# Patient Record
Sex: Male | Born: 1955
Health system: Southern US, Community
[De-identification: ages and names within clinical notes are randomized; demographics above are authoritative.]

## PROBLEM LIST (undated history)

## (undated) DIAGNOSIS — Z8051 Family history of malignant neoplasm of kidney: Secondary | ICD-10-CM

## (undated) DIAGNOSIS — K219 Gastro-esophageal reflux disease without esophagitis: Secondary | ICD-10-CM

## (undated) DIAGNOSIS — B1920 Unspecified viral hepatitis C without hepatic coma: Secondary | ICD-10-CM

## (undated) DIAGNOSIS — F17200 Nicotine dependence, unspecified, uncomplicated: Secondary | ICD-10-CM

## (undated) DIAGNOSIS — Z806 Family history of leukemia: Secondary | ICD-10-CM

## (undated) DIAGNOSIS — Z800 Family history of malignant neoplasm of digestive organs: Secondary | ICD-10-CM

## (undated) DIAGNOSIS — Z808 Family history of malignant neoplasm of other organs or systems: Secondary | ICD-10-CM

## (undated) DIAGNOSIS — Z801 Family history of malignant neoplasm of trachea, bronchus and lung: Secondary | ICD-10-CM

## (undated) DIAGNOSIS — I700 Atherosclerosis of aorta: Secondary | ICD-10-CM

## (undated) DIAGNOSIS — E785 Hyperlipidemia, unspecified: Secondary | ICD-10-CM

## (undated) DIAGNOSIS — Z973 Presence of spectacles and contact lenses: Secondary | ICD-10-CM

## (undated) DIAGNOSIS — I10 Essential (primary) hypertension: Secondary | ICD-10-CM

## (undated) DIAGNOSIS — Z8041 Family history of malignant neoplasm of ovary: Secondary | ICD-10-CM

## (undated) DIAGNOSIS — J449 Chronic obstructive pulmonary disease, unspecified: Secondary | ICD-10-CM

## (undated) DIAGNOSIS — Z8042 Family history of malignant neoplasm of prostate: Secondary | ICD-10-CM

## (undated) HISTORY — DX: Nicotine dependence, unspecified, uncomplicated: F17.200

## (undated) HISTORY — DX: Family history of malignant neoplasm of other organs or systems: Z80.8

## (undated) HISTORY — DX: Family history of malignant neoplasm of digestive organs: Z80.0

## (undated) HISTORY — DX: Family history of malignant neoplasm of kidney: Z80.51

## (undated) HISTORY — DX: Family history of malignant neoplasm of prostate: Z80.42

## (undated) HISTORY — DX: Family history of malignant neoplasm of ovary: Z80.41

## (undated) HISTORY — DX: Hyperlipidemia, unspecified: E78.5

## (undated) HISTORY — DX: Essential (primary) hypertension: I10

## (undated) HISTORY — DX: Unspecified viral hepatitis C without hepatic coma: B19.20

## (undated) HISTORY — PX: OTHER SURGICAL HISTORY: SHX169

## (undated) HISTORY — DX: Family history of leukemia: Z80.6

## (undated) HISTORY — DX: Presence of spectacles and contact lenses: Z97.3

## (undated) HISTORY — DX: Atherosclerosis of aorta: I70.0

## (undated) HISTORY — DX: Family history of malignant neoplasm of trachea, bronchus and lung: Z80.1

---

## 2005-10-04 ENCOUNTER — Ambulatory Visit: Payer: Self-pay | Admitting: Gastroenterology

## 2006-02-28 ENCOUNTER — Encounter: Admission: RE | Admit: 2006-02-28 | Discharge: 2006-02-28 | Payer: Self-pay | Admitting: Emergency Medicine

## 2006-03-09 ENCOUNTER — Encounter: Admission: RE | Admit: 2006-03-09 | Discharge: 2006-03-09 | Payer: Self-pay | Admitting: Emergency Medicine

## 2006-03-30 ENCOUNTER — Encounter: Admission: RE | Admit: 2006-03-30 | Discharge: 2006-03-30 | Payer: Self-pay | Admitting: Emergency Medicine

## 2006-04-27 ENCOUNTER — Encounter: Admission: RE | Admit: 2006-04-27 | Discharge: 2006-04-27 | Payer: Self-pay | Admitting: Emergency Medicine

## 2010-01-24 ENCOUNTER — Encounter: Payer: Self-pay | Admitting: Emergency Medicine

## 2011-07-16 ENCOUNTER — Encounter (HOSPITAL_COMMUNITY): Payer: Self-pay

## 2011-07-16 ENCOUNTER — Emergency Department (HOSPITAL_COMMUNITY)
Admission: EM | Admit: 2011-07-16 | Discharge: 2011-07-16 | Disposition: A | Discharge status: Home / Self Care | Payer: Worker's Compensation | Attending: Emergency Medicine | Admitting: Emergency Medicine

## 2011-07-16 DIAGNOSIS — S335XXA Sprain of ligaments of lumbar spine, initial encounter: Secondary | ICD-10-CM | POA: Insufficient documentation

## 2011-07-16 DIAGNOSIS — S39012A Strain of muscle, fascia and tendon of lower back, initial encounter: Secondary | ICD-10-CM

## 2011-07-16 DIAGNOSIS — X500XXA Overexertion from strenuous movement or load, initial encounter: Secondary | ICD-10-CM | POA: Insufficient documentation

## 2011-07-16 DIAGNOSIS — J4489 Other specified chronic obstructive pulmonary disease: Secondary | ICD-10-CM | POA: Insufficient documentation

## 2011-07-16 DIAGNOSIS — F17200 Nicotine dependence, unspecified, uncomplicated: Secondary | ICD-10-CM | POA: Insufficient documentation

## 2011-07-16 DIAGNOSIS — J449 Chronic obstructive pulmonary disease, unspecified: Secondary | ICD-10-CM | POA: Insufficient documentation

## 2011-07-16 HISTORY — DX: Chronic obstructive pulmonary disease, unspecified: J44.9

## 2011-07-16 MED ORDER — CYCLOBENZAPRINE HCL 10 MG PO TABS: ORAL_TABLET | ORAL | Status: DC

## 2011-07-16 MED ORDER — HYDROCODONE-ACETAMINOPHEN 5-325 MG PO TABS
1.0000 | ORAL_TABLET | Freq: Once | ORAL | Status: AC
  Administered 2011-07-16: 1 via ORAL
  Filled 2011-07-16: qty 1

## 2011-07-16 MED ORDER — IBUPROFEN 800 MG PO TABS
800.0000 mg | ORAL_TABLET | Freq: Once | ORAL | Status: AC
  Administered 2011-07-16: 800 mg via ORAL
  Filled 2011-07-16: qty 1

## 2011-07-16 MED ORDER — HYDROCODONE-ACETAMINOPHEN 5-325 MG PO TABS: 1.0000 | ORAL_TABLET | Freq: Four times a day (QID) | ORAL | Status: AC | PRN

## 2011-07-16 MED ORDER — CYCLOBENZAPRINE HCL 10 MG PO TABS
10.0000 mg | ORAL_TABLET | Freq: Once | ORAL | Status: AC
  Administered 2011-07-16: 10 mg via ORAL
  Filled 2011-07-16: qty 1

## 2011-07-16 NOTE — ED Provider Notes (Signed)
History     CSN: [id number]  Arrival date & time 07/16/11  5621   First MD Initiated Contact with Patient 07/16/11 [phone]      Chief Complaint  Patient presents with  . Back Pain    (Consider location/radiation/quality/duration/timing/severity/associated sxs/prior treatment) HPI Comments: Injured R lower back lifting a trailer ramp.  Patient is a 56 y.o. male presenting with back pain. The history is provided by the patient. No language interpreter was used.  Back Pain  This is a new problem. Episode onset: 5 days ago. The problem occurs constantly. The problem has not changed since onset.The pain is associated with lifting heavy objects. The pain is present in the lumbar spine. The quality of the pain is described as aching. The pain does not radiate. The pain is severe. The symptoms are aggravated by bending, twisting and certain positions. The pain is the same all the time. Pertinent negatives include no numbness, no bowel incontinence, no perianal numbness, no bladder incontinence, no leg pain, no paresthesias, no paresis, no tingling and no weakness. He has tried ice, NSAIDs and heat for the symptoms. The treatment provided no relief.    Past Medical History  Diagnosis Date  . COPD (chronic obstructive pulmonary disease)     History reviewed. No pertinent past surgical history.  No family history on file.  History  Substance Use Topics  . Smoking status: Current Everyday Smoker  . Smokeless tobacco: Not on file  . Alcohol Use: No      Review of Systems  Gastrointestinal: Negative for bowel incontinence.  Genitourinary: Negative for bladder incontinence.  Musculoskeletal: Positive for back pain.  Neurological: Negative for tingling, weakness, numbness and paresthesias.  All other systems reviewed and are negative.    Allergies  Review of patient's allergies indicates no known allergies.  Home Medications   Current Outpatient Rx  Name Route Sig Dispense Refill    . CYCLOBENZAPRINE HCL 10 MG PO TABS  1/2 to one tab po TID 20 tablet 0  . HYDROCODONE-ACETAMINOPHEN 5-325 MG PO TABS Oral Take 1 tablet by mouth every 6 (six) hours as needed for pain. 20 tablet 0    BP 146/71  Pulse 61  Temp 97.8 F (36.6 C) (Oral)  Resp 20  Ht 6' (1.829 m)  Wt 180 lb (81.647 kg)  BMI 24.41 kg/m2  SpO2 100%  Physical Exam  Nursing note and vitals reviewed. Constitutional: He is oriented to person, place, and time. He appears well-developed and well-nourished.  HENT:  Head: Normocephalic and atraumatic.  Eyes: EOM are normal.  Neck: Normal range of motion.  Cardiovascular: Normal rate, regular rhythm, normal heart sounds and intact distal pulses.   Pulmonary/Chest: Effort normal and breath sounds normal. No respiratory distress.  Abdominal: Soft. He exhibits no distension. There is no tenderness.  Musculoskeletal: He exhibits tenderness.       Lumbar back: He exhibits decreased range of motion and tenderness. He exhibits no bony tenderness.       Back:  Neurological: He is alert and oriented to person, place, and time.  Skin: Skin is warm and dry.  Psychiatric: He has a normal mood and affect. Judgment normal.    ED Course  Procedures (including critical care time)  Labs Reviewed - No data to display No results found.   1. Lumbar strain       MDM  Ice [id number] TID rx-flexeril, 20 rx-hydrocodone, 20        Evalina Field, [place] 07/16/11 [phone]

## 2011-07-16 NOTE — ED Notes (Signed)
Injured low back on Monday --occurred while lifting ramps.

## 2011-07-21 NOTE — ED Provider Notes (Signed)
Medical screening examination/treatment/procedure(s) were performed by non-physician practitioner and as supervising physician I was immediately available for consultation/collaboration.   Nika Yazzie W. Ovidio Steele, MD 07/21/11 2205 

## 2016-04-28 ENCOUNTER — Ambulatory Visit
Admission: RE | Admit: 2016-04-28 | Discharge: 2016-04-28 | Disposition: A | Discharge status: Home / Self Care | Payer: BLUE CROSS/BLUE SHIELD | Source: Ambulatory Visit | Attending: Medical | Admitting: Medical

## 2016-04-28 ENCOUNTER — Ambulatory Visit (INDEPENDENT_AMBULATORY_CARE_PROVIDER_SITE_OTHER): Payer: BLUE CROSS/BLUE SHIELD | Admitting: Medical

## 2016-04-28 ENCOUNTER — Encounter: Payer: Self-pay | Admitting: Medical

## 2016-04-28 VITALS — BP 170/110 | HR 57 | Ht 69.0 in | Wt 184.2 lb

## 2016-04-28 DIAGNOSIS — R229 Localized swelling, mass and lump, unspecified: Secondary | ICD-10-CM | POA: Diagnosis not present

## 2016-04-28 DIAGNOSIS — H9319 Tinnitus, unspecified ear: Secondary | ICD-10-CM

## 2016-04-28 DIAGNOSIS — H6121 Impacted cerumen, right ear: Secondary | ICD-10-CM

## 2016-04-28 DIAGNOSIS — R062 Wheezing: Secondary | ICD-10-CM

## 2016-04-28 DIAGNOSIS — R030 Elevated blood-pressure reading, without diagnosis of hypertension: Secondary | ICD-10-CM

## 2016-04-28 DIAGNOSIS — F17200 Nicotine dependence, unspecified, uncomplicated: Secondary | ICD-10-CM | POA: Diagnosis not present

## 2016-04-28 DIAGNOSIS — M25512 Pain in left shoulder: Secondary | ICD-10-CM | POA: Diagnosis not present

## 2016-04-28 DIAGNOSIS — G8929 Other chronic pain: Secondary | ICD-10-CM

## 2016-04-28 MED ORDER — NAPROXEN 500 MG PO TABS: 500.0000 mg | ORAL_TABLET | Freq: Two times a day (BID) | ORAL | 0 refills | Status: DC

## 2016-04-28 MED ORDER — ALBUTEROL SULFATE HFA 108 (90 BASE) MCG/ACT IN AERS: 2.0000 | INHALATION_SPRAY | Freq: Four times a day (QID) | RESPIRATORY_TRACT | 1 refills | Status: DC | PRN

## 2016-04-28 MED ORDER — FLUTICASONE-SALMETEROL 250-50 MCG/DOSE IN AEPB: 1.0000 | INHALATION_SPRAY | Freq: Two times a day (BID) | RESPIRATORY_TRACT | 2 refills | Status: DC

## 2016-04-28 NOTE — Patient Instructions (Signed)
Left shoulder pain   Go for xray of shoulder  Begin Naprosyn prescription twice daily for 5-7 days for pain and inflammation  We will call with xray results  Wheezing, COPD  STOP SMOKING!!!  Begin trial of Advair inhaler 1 puff twice daily  rinse your mouth out with water after each use  You can ALSO use Albuterol "rescue" inhaler every 6 hours as needed for wheezing, cough, or shortness of breath  Your blood pressure is elevated today  Please start checking your blood pressure and write the numbers down  You can get a cuff at the drug store or just use the blood pressure cuff at the pharmacy to check the pressures  Normal is 120/70. Your pressure was quite high today  Lets plan on seeing you soon for a physical and recheck on blood pressure    Hypertension Hypertension, commonly called high blood pressure, is when the force of blood pumping through the arteries is too strong. The arteries are the blood vessels that carry blood from the heart throughout the body. Hypertension forces the heart to work harder to pump blood and may cause arteries to become narrow or stiff. Having untreated or uncontrolled hypertension can cause heart attacks, strokes, kidney disease, and other problems. A blood pressure reading consists of a higher number over a lower number. Ideally, your blood pressure should be below 120/80. The first ("top") number is called the systolic pressure. It is a measure of the pressure in your arteries as your heart beats. The second ("bottom") number is called the diastolic pressure. It is a measure of the pressure in your arteries as the heart relaxes. What are the causes? The cause of this condition is not known. What increases the risk? Some risk factors for high blood pressure are under your control. Others are not. Factors you can change   Smoking.  Having type 2 diabetes mellitus, high cholesterol, or both.  Not getting enough exercise or physical  activity.  Being overweight.  Having too much fat, sugar, calories, or salt (sodium) in your diet.  Drinking too much alcohol. Factors that are difficult or impossible to change   Having chronic kidney disease.  Having a family history of high blood pressure.  Age. Risk increases with age.  Race. You may be at higher risk if you are African-American.  Gender. Men are at higher risk than women before age 34. After age 16, women are at higher risk than men.  Having obstructive sleep apnea.  Stress. What are the signs or symptoms? Extremely high blood pressure (hypertensive crisis) may cause:  Headache.  Anxiety.  Shortness of breath.  Nosebleed.  Nausea and vomiting.  Severe chest pain.  Jerky movements you cannot control (seizures). How is this diagnosed? This condition is diagnosed by measuring your blood pressure while you are seated, with your arm resting on a surface. The cuff of the blood pressure monitor will be placed directly against the skin of your upper arm at the level of your heart. It should be measured at least twice using the same arm. Certain conditions can cause a difference in blood pressure between your right and left arms. Certain factors can cause blood pressure readings to be lower or higher than normal (elevated) for a short period of time:  When your blood pressure is higher when you are in a health care provider's office than when you are at home, this is called white coat hypertension. Most people with this condition do not need  medicines.  When your blood pressure is higher at home than when you are in a health care provider's office, this is called masked hypertension. Most people with this condition may need medicines to control blood pressure. If you have a high blood pressure reading during one visit or you have normal blood pressure with other risk factors:  You may be asked to return on a different day to have your blood pressure checked  again.  You may be asked to monitor your blood pressure at home for 1 week or longer. If you are diagnosed with hypertension, you may have other blood or imaging tests to help your health care provider understand your overall risk for other conditions. How is this treated? This condition is treated by making healthy lifestyle changes, such as eating healthy foods, exercising more, and reducing your alcohol intake. Your health care provider may prescribe medicine if lifestyle changes are not enough to get your blood pressure under control, and if:  Your systolic blood pressure is above 130.  Your diastolic blood pressure is above 80. Your personal target blood pressure may vary depending on your medical conditions, your age, and other factors. Follow these instructions at home: Eating and drinking   Eat a diet that is high in fiber and potassium, and low in sodium, added sugar, and fat. An example eating plan is called the DASH (Dietary Approaches to Stop Hypertension) diet. To eat this way:  Eat plenty of fresh fruits and vegetables. Try to fill half of your plate at each meal with fruits and vegetables.  Eat whole grains, such as whole wheat pasta, brown rice, or whole grain bread. Fill about one quarter of your plate with whole grains.  Eat or drink low-fat dairy products, such as skim milk or low-fat yogurt.  Avoid fatty cuts of meat, processed or cured meats, and poultry with skin. Fill about one quarter of your plate with lean proteins, such as fish, chicken without skin, beans, eggs, and tofu.  Avoid premade and processed foods. These tend to be higher in sodium, added sugar, and fat.  Reduce your daily sodium intake. Most people with hypertension should eat less than 1,500 mg of sodium a day.  Limit alcohol intake to no more than 1 drink a day for nonpregnant women and 2 drinks a day for men. One drink equals 12 oz of beer, 5 oz of wine, or 1 oz of hard liquor. Lifestyle   Work  with your health care provider to maintain a healthy body weight or to lose weight. Ask what an ideal weight is for you.  Get at least 30 minutes of exercise that causes your heart to beat faster (aerobic exercise) most days of the week. Activities may include walking, swimming, or biking.  Include exercise to strengthen your muscles (resistance exercise), such as pilates or lifting weights, as part of your weekly exercise routine. Try to do these types of exercises for 30 minutes at least 3 days a week.  Do not use any products that contain nicotine or tobacco, such as cigarettes and e-cigarettes. If you need help quitting, ask your health care provider.  Monitor your blood pressure at home as told by your health care provider.  Keep all follow-up visits as told by your health care provider. This is important. Medicines   Take over-the-counter and prescription medicines only as told by your health care provider. Follow directions carefully. Blood pressure medicines must be taken as prescribed.  Do not skip doses  of blood pressure medicine. Doing this puts you at risk for problems and can make the medicine less effective.  Ask your health care provider about side effects or reactions to medicines that you should watch for. Contact a health care provider if:  You think you are having a reaction to a medicine you are taking.  You have headaches that keep coming back (recurring).  You feel dizzy.  You have swelling in your ankles.  You have trouble with your vision. Get help right away if:  You develop a severe headache or confusion.  You have unusual weakness or numbness.  You feel faint.  You have severe pain in your chest or abdomen.  You vomit repeatedly.  You have trouble breathing. Summary  Hypertension is when the force of blood pumping through your arteries is too strong. If this condition is not controlled, it may put you at risk for serious complications.  Your  personal target blood pressure may vary depending on your medical conditions, your age, and other factors. For most people, a normal blood pressure is less than 120/80.  Hypertension is treated with lifestyle changes, medicines, or a combination of both. Lifestyle changes include weight loss, eating a healthy, low-sodium diet, exercising more, and limiting alcohol. This information is not intended to replace advice given to you by your health care provider. Make sure you discuss any questions you have with your health care provider. Document Released: 12/20/2004 Document Revised: 11/18/2015 Document Reviewed: 11/18/2015 Elsevier Interactive Patient Education  2017 [place] DASH stands for "Dietary Approaches to Stop Hypertension." The DASH eating plan is a healthy eating plan that has been shown to reduce high blood pressure (hypertension). Additional health benefits may include reducing the risk of type 2 diabetes mellitus, heart disease, and stroke. The DASH eating plan may also help with weight loss. WHAT DO I NEED TO KNOW ABOUT THE DASH EATING PLAN? For the DASH eating plan, you will follow these general guidelines:  Choose foods with a percent daily value for sodium of less than 5% (as listed on the food label).  Use salt-free seasonings or herbs instead of table salt or sea salt.  Check with your health care provider or pharmacist before using salt substitutes.  Eat lower-sodium products, often labeled as "lower sodium" or "no salt added."  Eat fresh foods.  Eat more vegetables, fruits, and low-fat dairy products.  Choose whole grains. Look for the word "whole" as the first word in the ingredient list.  Choose fish and skinless chicken or [place] more often than red meat. Limit fish, poultry, and meat to 6 oz (170 g) each day.  Limit sweets, desserts, sugars, and sugary drinks.  Choose heart-healthy fats.  Limit cheese to 1 oz (28 g) per day.  Eat  more home-cooked food and less restaurant, buffet, and fast food.  Limit fried foods.  Cook foods using methods other than frying.  Limit canned vegetables. If you do use them, rinse them well to decrease the sodium.  When eating at a restaurant, ask that your food be prepared with less salt, or no salt if possible. WHAT FOODS CAN I EAT? Seek help from a dietitian for individual calorie needs. Grains Whole grain or whole wheat bread. Brown rice. Whole grain or whole wheat pasta. Quinoa, bulgur, and whole grain cereals. Low-sodium cereals. Corn or whole wheat flour tortillas. Whole grain cornbread. Whole grain crackers. Low-sodium crackers. Vegetables Fresh or frozen vegetables (raw, steamed, roasted,  or grilled). Low-sodium or reduced-sodium tomato and vegetable juices. Low-sodium or reduced-sodium tomato sauce and paste. Low-sodium or reduced-sodium canned vegetables.  Fruits All fresh, canned (in natural juice), or frozen fruits. Meat and Other Protein Products Ground beef (85% or leaner), grass-fed beef, or beef trimmed of fat. Skinless chicken or [place]. Ground chicken or [place]. Pork trimmed of fat. All fish and seafood. Eggs. Dried beans, peas, or lentils. Unsalted nuts and seeds. Unsalted canned beans. Dairy Low-fat dairy products, such as skim or 1% milk, 2% or reduced-fat cheeses, low-fat ricotta or cottage cheese, or plain low-fat yogurt. Low-sodium or reduced-sodium cheeses. Fats and Oils Tub margarines without trans fats. Light or reduced-fat mayonnaise and salad dressings (reduced sodium). Avocado. Safflower, olive, or canola oils. Natural peanut or almond butter. Other Unsalted popcorn and pretzels. The items listed above may not be a complete list of recommended foods or beverages. Contact your dietitian for more options. WHAT FOODS ARE NOT RECOMMENDED? Grains White bread. White pasta. White rice. Refined cornbread. Bagels and croissants. Crackers that contain trans  fat. Vegetables Creamed or fried vegetables. Vegetables in a cheese sauce. Regular canned vegetables. Regular canned tomato sauce and paste. Regular tomato and vegetable juices. Fruits Dried fruits. Canned fruit in light or heavy syrup. Fruit juice. Meat and Other Protein Products Fatty cuts of meat. Ribs, chicken wings, bacon, sausage, bologna, salami, chitterlings, fatback, hot dogs, bratwurst, and packaged luncheon meats. Salted nuts and seeds. Canned beans with salt. Dairy Whole or 2% milk, cream, half-and-half, and cream cheese. Whole-fat or sweetened yogurt. Full-fat cheeses or blue cheese. Nondairy creamers and whipped toppings. Processed cheese, cheese spreads, or cheese curds. Condiments Onion and garlic salt, seasoned salt, table salt, and sea salt. Canned and packaged gravies. Worcestershire sauce. Tartar sauce. Barbecue sauce. Teriyaki sauce. Soy sauce, including reduced sodium. Steak sauce. Fish sauce. Oyster sauce. Cocktail sauce. Horseradish. Ketchup and mustard. Meat flavorings and tenderizers. Bouillon cubes. Hot sauce. Tabasco sauce. Marinades. Taco seasonings. Relishes. Fats and Oils Butter, stick margarine, lard, shortening, ghee, and bacon fat. Coconut, palm kernel, or palm oils. Regular salad dressings. Other Pickles and olives. Salted popcorn and pretzels. The items listed above may not be a complete list of foods and beverages to avoid. Contact your dietitian for more information. WHERE CAN I FIND MORE INFORMATION? National Heart, Lung, and Blood Institute: travelstabloid.com Document Released: 12/09/2010 Document Revised: 05/06/2013 Document Reviewed: 10/24/2012 [facility] Patient Information 2015 [place], [place]. This information is not intended to replace advice given to you by your health care provider. Make sure you discuss any questions you have with your health care provider.        Why follow it? Research shows. . Those who  follow the Mediterranean diet have a reduced risk of heart disease  . The diet is associated with a reduced incidence of Parkinson's and Alzheimer's diseases . People following the diet may have longer life expectancies and lower rates of chronic diseases  . The Dietary Guidelines for Americans recommends the Mediterranean diet as an eating plan to promote health and prevent disease  What Is the Mediterranean Diet?  . Healthy eating plan based on typical foods and recipes of Mediterranean-style cooking . The diet is primarily a plant based diet; these foods should make up a majority of meals   Starches - Plant based foods should make up a majority of meals - They are an important sources of vitamins, minerals, energy, antioxidants, and fiber - Choose whole grains, foods high in fiber and minimally processed  items  - Typical grain sources include wheat, oats, barley, corn, brown rice, bulgar, farro, millet, polenta, couscous  - Various types of beans include chickpeas, lentils, fava beans, black beans, white beans   Fruits  Veggies - Large quantities of antioxidant rich fruits & veggies; 6 or more servings  - Vegetables can be eaten raw or lightly drizzled with oil and cooked  - Vegetables common to the traditional Mediterranean Diet include: artichokes, arugula, beets, broccoli, brussel sprouts, cabbage, carrots, celery, collard greens, cucumbers, eggplant, kale, leeks, lemons, lettuce, mushrooms, okra, onions, peas, peppers, potatoes, pumpkin, radishes, rutabaga, shallots, spinach, sweet potatoes, turnips, zucchini - Fruits common to the Mediterranean Diet include: apples, apricots, avocados, cherries, clementines, dates, figs, grapefruits, grapes, melons, nectarines, oranges, peaches, pears, pomegranates, strawberries, tangerines  Fats - Replace butter and margarine with healthy oils, such as olive oil, canola oil, and tahini  - Limit nuts to no more than a handful a day  - Nuts include  walnuts, almonds, pecans, pistachios, pine nuts  - Limit or avoid candied, honey roasted or heavily salted nuts - Olives are central to the [employer] - can be eaten whole or used in a variety of dishes   Meats Protein - Limiting red meat: no more than a few times a month - When eating red meat: choose lean cuts and keep the portion to the size of deck of cards - Eggs: approx. 0 to 4 times a week  - Fish and lean poultry: at least 2 a week  - Healthy protein sources include, chicken, [place], lean beef, lamb - Increase intake of seafood such as tuna, salmon, trout, mackerel, shrimp, scallops - Avoid or limit high fat processed meats such as sausage and bacon  Dairy - Include moderate amounts of low fat dairy products  - Focus on healthy dairy such as fat free yogurt, skim milk, low or reduced fat cheese - Limit dairy products higher in fat such as whole or 2% milk, cheese, ice cream  Alcohol - Moderate amounts of red wine is ok  - No more than 5 oz daily for women (all ages) and men older than age 87  - No more than 10 oz of wine daily for men younger than 72  Other - Limit sweets and other desserts  - Use herbs and spices instead of salt to flavor foods  - Herbs and spices common to the traditional Mediterranean Diet include: basil, bay leaves, chives, cloves, cumin, fennel, garlic, lavender, marjoram, mint, oregano, parsley, pepper, rosemary, sage, savory, sumac, tarragon, thyme   It's not just a diet, it's a lifestyle:  . The Mediterranean diet includes lifestyle factors typical of those in the region  . Foods, drinks and meals are best eaten with others and savored . Daily physical activity is important for overall good health . This could be strenuous exercise like running and aerobics . This could also be more leisurely activities such as walking, housework, yard-work, or taking the stairs . Moderation is the key; a balanced and healthy diet accommodates most foods and  drinks . Consider portion sizes and frequency of consumption of certain foods   Meal Ideas & Options:  . Breakfast:  o Whole wheat toast or whole wheat English muffins with peanut butter & hard boiled egg o Steel cut oats topped with apples & cinnamon and skim milk  o Fresh fruit: banana, strawberries, melon, berries, peaches  o Smoothies: strawberries, bananas, greek yogurt, peanut butter o Low fat greek yogurt with  blueberries and granola  o Egg white omelet with spinach and mushrooms o Breakfast couscous: whole wheat couscous, apricots, skim milk, cranberries  . Sandwiches:  o Hummus and grilled vegetables (peppers, zucchini, squash) on whole wheat bread   o Grilled chicken on whole wheat pita with lettuce, tomatoes, cucumbers or tzatziki  o Tuna salad on whole wheat bread: tuna salad made with greek yogurt, olives, red peppers, capers, green onions o Garlic rosemary lamb pita: lamb sauted with garlic, rosemary, salt & pepper; add lettuce, cucumber, greek yogurt to pita - flavor with lemon juice and black pepper  . Seafood:  o Mediterranean grilled salmon, seasoned with garlic, basil, parsley, lemon juice and black pepper o Shrimp, lemon, and spinach whole-grain pasta salad made with low fat greek yogurt  o Seared scallops with lemon orzo  o Seared tuna steaks seasoned salt, pepper, coriander topped with tomato mixture of olives, tomatoes, olive oil, minced garlic, parsley, green onions and cappers  . Meats:  o Herbed greek chicken salad with kalamata olives, cucumber, feta  o Red bell peppers stuffed with spinach, bulgur, lean ground beef (or lentils) & topped with feta   o Kebabs: skewers of chicken, tomatoes, onions, zucchini, squash  o [place] burgers: made with red onions, mint, dill, lemon juice, feta cheese topped with roasted red peppers . Vegetarian o Cucumber salad: cucumbers, artichoke hearts, celery, red onion, feta cheese, tossed in olive oil & lemon juice  o Hummus and  whole grain pita points with a greek salad (lettuce, tomato, feta, olives, cucumbers, red onion) o Lentil soup with celery, carrots made with vegetable broth, garlic, salt and pepper  o Tabouli salad: parsley, bulgur, mint, scallions, cucumbers, tomato, radishes, lemon juice, olive oil, salt and pepper.

## 2016-04-28 NOTE — Progress Notes (Signed)
Subjective:     Patient ID: Duane Alexander, male   DOB: 06-26-1955, 61 y.o.   MRN: [id number]  HPI Chief Complaint  Patient presents with  . New Patient (Initial Visit)    ring in the ear on going  , left shoulder pain x48month    Here as a new patient.. No particular PCP prior.  Left shoulder RTC is killing him.  Been having shoulder program x 2 years. Denies specific injury or trauma.   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   He uses levels and both arms daily. Gets pain running down left shoulder to wrist, says he has tendonitis in wrist.   Using no splints.  Using no medications.  sometimes gets swelling in left wrist along thumb.  No other joint swelling.  sometimes gets night time pain.  He notes ringing in ears for years.  Sometimes wears hearing protection at work, but does use a loud machine at work.   Denies allergy problems.  No drainage out the ears.   No guns, no loud music or other things that damage hearing regularly.   Had ear wax cleaned out last year.    That helped the ringing for a day or 2 then it came back.   No hx/o high blood pressure.    Does smoke.  Drinks 1-2 beers some days.  Was told he had COPD prior and was on inhalers in the past. She says he wheezes every night.  He has considered quitting tobacco  Past Medical History:  Diagnosis Date  . COPD (chronic obstructive pulmonary disease) ([place])    No current outpatient prescriptions on file prior to visit.   No current facility-administered medications on file prior to visit.     Review of Systems Constitutional: -fever, -chills, -sweats, -unexpected weight change,-fatigue ENT: -runny nose, -ear pain, -sore throat Cardiology:  -chest pain, -palpitations, -edema Respiratory: -cough, +shortness of breath, +wheezing Gastroenterology: -abdominal pain, -nausea, -vomiting, -diarrhea, -constipation  Hematology: -bleeding or bruising problems Musculoskeletal:  +arthralgias, -myalgias, +joint swelling, -back pain Ophthalmology: -vision changes Urology: -dysuria, -difficulty urinating, -hematuria, -urinary frequency, -urgency Neurology: -headache, -weakness, -tingling, -numbness      Objective:   Physical Exam BP (!) 170/110   Pulse (!) 57   Ht 5\' 9"  (1.753 m)   Wt 184 lb 3.2 oz (83.6 kg)   SpO2 98%   BMI 27.20 kg/m   General appearance: alert, no distress, WD/WN, white male, smoker voice/raspy somewhat HEENT: normocephalic, sclerae anicteric, right ear canal with mostly impacted cerumen, left TM pearly, nares patent, no discharge or erythema, pharynx normal Oral cavity: MMM, no lesions Neck: supple, no lymphadenopathy, no thyromegaly, no masses, no bruits Heart: RRR, normal S1, S2, no murmurs Lungs: CTA bilaterally, no wheezes, rhonchi, or rales MSK: mild tenderness of left anterior and posterior shoulder, pain with left shoulder abduction and empt can test, mild pain with hawkins and ext/int rotation.   His external rotation is mildly reduced and with pain.   Otherwise no tenderness of AC joint , no laxity, no sulcus sign.   Right shoulder exam unremarkable.  Left lateral wrist with mild tenderness and puffiness, but ROM normal.   Rest of arm exam unremarkable Pulses: 2+ symmetric, upper and lower extremities, normal cap refill UE neurovascularly intact      Assessment:     Encounter Diagnoses  Name Primary?  . Chronic left shoulder pain Yes  . Tinnitus, unspecified laterality   .  Lump of skin   . Smoking   . Wheezing   . Elevated blood-pressure reading, without diagnosis of hypertension   . Impacted cerumen of right ear         Plan:     Discussed his concerns.     Impacted cerumen: Discussed findings.  Discussed risk/benefits of procedure and patient agrees to procedure. Successfully used warm water lavage to remove impacted cerumen from right ear canal. Patient tolerated procedure well. Advised they avoid using any cotton  swabs or other devices to clean the ear canals.  Use basic hygiene as discussed.  Follow up prn.   Hearing improved with lavage, and hearing test improved as well on right.  Left shoulder pain   Go for xray of shoulder  Begin Naprosyn prescription twice daily for 5-7 days for pain and inflammation  We will call with xray results  Wheezing, COPD  STOP SMOKING!!!  Begin trial of Advair inhaler 1 puff twice daily  rinse your mouth out with water after each use  You can ALSO use Albuterol "rescue" inhaler every 6 hours as needed for wheezing, cough, or shortness of breath  Your blood pressure is elevated today  Please start checking your blood pressure and write the numbers down  You can get a cuff at the drug store or just use the blood pressure cuff at the pharmacy to check the pressures  Normal is 120/70. Your pressure was quite high today  Lets plan on seeing you soon for a physical and recheck on blood pressure   Duane Alexander was seen today for new patient (initial visit).  Diagnoses and all orders for this visit:  Chronic left shoulder pain -     DG Shoulder Left; Future  Tinnitus, unspecified laterality  Lump of skin  Smoking  Wheezing -     Spirometry with Graph  Elevated blood-pressure reading, without diagnosis of hypertension  Impacted cerumen of right ear  Other orders -     albuterol (PROVENTIL HFA;VENTOLIN HFA) 108 (90 Base) MCG/ACT inhaler; Inhale 2 puffs into the lungs every 6 (six) hours as needed for wheezing or shortness of breath. -     Fluticasone-Salmeterol (ADVAIR DISKUS) 250-50 MCG/DOSE AEPB; Inhale 1 puff into the lungs 2 (two) times daily. -     naproxen (NAPROSYN) 500 MG tablet; Take 1 tablet (500 mg total) by mouth 2 (two) times daily with a meal.

## 2016-05-09 ENCOUNTER — Telehealth: Payer: Self-pay | Admitting: Family Medicine

## 2016-05-09 NOTE — Telephone Encounter (Signed)
Come in and lets discuss getting on BP medication.  He hasn't had this diagnosis prior but BP was elevated on his first visit her, and it sounds like its staying high.

## 2016-05-09 NOTE — Telephone Encounter (Signed)
Pt is coming in at 8.00 in the morning

## 2016-05-09 NOTE — Telephone Encounter (Signed)
Pt girlfriend called.  Pt bp is 181/112 stating seek medical attention.  What should they do?  [phone]

## 2016-05-10 ENCOUNTER — Other Ambulatory Visit: Payer: Self-pay | Admitting: Medical

## 2016-05-10 ENCOUNTER — Encounter: Payer: Self-pay | Admitting: Medical

## 2016-05-10 ENCOUNTER — Telehealth: Payer: Self-pay | Admitting: Medical

## 2016-05-10 ENCOUNTER — Ambulatory Visit (INDEPENDENT_AMBULATORY_CARE_PROVIDER_SITE_OTHER): Payer: BLUE CROSS/BLUE SHIELD | Admitting: Medical

## 2016-05-10 VITALS — BP 162/110 | HR 67 | Wt 190.4 lb

## 2016-05-10 DIAGNOSIS — J449 Chronic obstructive pulmonary disease, unspecified: Secondary | ICD-10-CM

## 2016-05-10 DIAGNOSIS — IMO0001 Reserved for inherently not codable concepts without codable children: Secondary | ICD-10-CM

## 2016-05-10 DIAGNOSIS — Z1211 Encounter for screening for malignant neoplasm of colon: Secondary | ICD-10-CM

## 2016-05-10 DIAGNOSIS — F17200 Nicotine dependence, unspecified, uncomplicated: Secondary | ICD-10-CM | POA: Diagnosis not present

## 2016-05-10 DIAGNOSIS — R062 Wheezing: Secondary | ICD-10-CM

## 2016-05-10 DIAGNOSIS — I10 Essential (primary) hypertension: Secondary | ICD-10-CM | POA: Diagnosis not present

## 2016-05-10 DIAGNOSIS — Z122 Encounter for screening for malignant neoplasm of respiratory organs: Secondary | ICD-10-CM

## 2016-05-10 DIAGNOSIS — Z801 Family history of malignant neoplasm of trachea, bronchus and lung: Secondary | ICD-10-CM

## 2016-05-10 MED ORDER — LISINOPRIL-HYDROCHLOROTHIAZIDE 10-12.5 MG PO TABS: 1.0000 | ORAL_TABLET | Freq: Every day | ORAL | 1 refills | Status: DC

## 2016-05-10 MED ORDER — ASPIRIN EC 81 MG PO TBEC: 81.0000 mg | DELAYED_RELEASE_TABLET | Freq: Every day | ORAL | 3 refills | Status: DC

## 2016-05-10 NOTE — Telephone Encounter (Signed)
Refer for colonoscopy  Set up Ct chest lung cancer screen

## 2016-05-10 NOTE — Progress Notes (Signed)
Subjective:     Patient ID: Duane Alexander, male   DOB: 06-Feb-1955, 61 y.o.   MRN: [id number]  HPI Chief Complaint  Patient presents with  . b/p running high    b/p running high   Here for elevated pressures.  He came in recently as a new patient for several concerns, and BP was noted to be high then.  He has been checking BPs since then and all readings are high.  Last visit we began Advair for COPD, wheezing.  He continues to smoke.  Seeing improvement  He has questions about colonoscopy referral, has never had one.  Past Medical History:  Diagnosis Date  . COPD (chronic obstructive pulmonary disease) ([place])   . Smoker   . Wears glasses    Current Outpatient Prescriptions on File Prior to Visit  Medication Sig Dispense Refill  . albuterol (PROVENTIL HFA;VENTOLIN HFA) 108 (90 Base) MCG/ACT inhaler Inhale 2 puffs into the lungs every 6 (six) hours as needed for wheezing or shortness of breath. 1 Inhaler 1  . Fluticasone-Salmeterol (ADVAIR DISKUS) 250-50 MCG/DOSE AEPB Inhale 1 puff into the lungs 2 (two) times daily. 1 each 2  . Multiple Vitamin (MULTIVITAMIN) tablet Take 1 tablet by mouth daily.    . naproxen (NAPROSYN) 500 MG tablet Take 1 tablet (500 mg total) by mouth 2 (two) times daily with a meal. 30 tablet 0   No current facility-administered medications on file prior to visit.    Family History  Problem Relation Age of Onset  . Cancer Mother     lung  . Other Father     died in his 34s of stomach issues?  . Cancer Brother 59    lung  . Other Sister     GI related death  . Heart disease Neg Hx   . Hypertension Neg Hx   . Stroke Neg Hx     Review of Systems Constitutional: -fever, -chills, -sweats, -unexpected weight change,-fatigue ENT: -runny nose, -ear pain, -sore throat Cardiology:  -chest pain, -palpitations, -edema Respiratory: -cough, +shortness of breath, +wheezing Gastroenterology: -abdominal pain, -nausea, -vomiting, -diarrhea, -constipation  Hematology: -bleeding or bruising problems Musculoskeletal: -arthralgias, -myalgias, -joint swelling, -back pain Ophthalmology: -vision changes Urology: -dysuria, -difficulty urinating, -hematuria, -urinary frequency, -urgency Neurology: -headache, -weakness, -tingling, -numbness       Objective:   Physical Exam BP (!) 162/110   Pulse 67   Wt 190 lb 6.4 oz (86.4 kg)   SpO2 94%   BMI 28.12 kg/m   BP Readings from Last 3 Encounters:  05/10/16 (!) 162/110  04/28/16 (!) 170/110  07/16/11 146/71   Wt Readings from Last 3 Encounters:  05/10/16 190 lb 6.4 oz (86.4 kg)  04/28/16 184 lb 3.2 oz (83.6 kg)  07/16/11 180 lb (81.6 kg)   General appearence: alert, no distress, WD/WN,  Neck: supple, no lymphadenopathy, no thyromegaly, no masses, no bruits Heart: RRR, normal S1, S2, no murmurs Lungs:decreased lung sounds in general, no wheezes, rhonchi, or rales Pulses: 2+ symmetric, upper and lower extremities, normal cap refill Ext: no edema   Adult ECG Report  Indication: hypertension  Rate: 60 bpm  Rhythm: normal sinus rhythm  QRS Axis: 60 degrees  PR Interval: 124m  QRS Duration: 88m QTc: 42823mConduction Disturbances: none  Other Abnormalities: LVH  Patient's cardiac risk factors are: advanced age (older than 55 14r men, 65 5r women), hypertension, male gender and smoking/ tobacco exposure.  EKG comparison: none  Narrative Interpretation: LVH  Assessment:     Encounter Diagnoses  Name Primary?  . Hypertension, unspecified type Yes  . Smoking   . Wheezing   . Chronic obstructive pulmonary disease, unspecified COPD type ([place])   . Encounter for screening for lung cancer   . Screen for colon cancer        Plan:     HTN - discussed diagnosis, treatment, possible complications.  Begin trial of Lisinopril HCT. Discussed risks/benefits of medication.  He has already begun making changes with diet, exercise, and has started cutting back on alcohol and tobacco.    Discussed possibly getting CT chest or CXR, possible echo going forward.    Recommendations:  Begin Lisinopril HCT blood pressure medication, 1 tablet daily in the morning  Begin Aspirin 76m daily at bedtime.  This is a preventative measure to reduce risk of heart disease  Follow up as planned Monday for physical  You can do fasting labs on your physical or any morning Monday - Friday prior to the physical at 8:30am  Call your insurer to see if they cover a chest CT for lung cancer screening since you have been smoking > 30 years  Call your insurer to see if they cover Colonoscopy vs Colo guard kit  Continue to monitor your blood pressure  MYveswas seen today for b/p running high.  Diagnoses and all orders for this visit:  Hypertension, unspecified type -     EKG 12-Lead -     Cancel: Comprehensive metabolic panel -     Cancel: Lipid panel -     Cancel: CBC -     Cancel: TSH  Smoking  Wheezing  Chronic obstructive pulmonary disease, unspecified COPD type ([place]  Encounter for screening for lung cancer  Screen for colon cancer  Other orders -     lisinopril-hydrochlorothiazide (PRINZIDE,ZESTORETIC) 10-12.5 MG tablet; Take 1 tablet by mouth daily. -     aspirin EC 81 MG tablet; Take 1 tablet (81 mg total) by mouth daily.

## 2016-05-10 NOTE — Patient Instructions (Signed)
Encounter Diagnoses  Name Primary?  . Hypertension, unspecified type Yes  . Smoking   . Wheezing   . Chronic obstructive pulmonary disease, unspecified COPD type ([place])   . Encounter for screening for lung cancer   . Screen for colon cancer     Recommendations:  Begin Lisinopril HCT blood pressure medication, 1 tablet daily in the morning  Begin Aspirin 69m daily at bedtime.  This is a preventative measure to reduce risk of heart disease  Follow up as planned Monday for physical  You can do fasting labs on your physical or any morning Monday - Friday prior to the physical at 8:30am  Call your insurer to see if they cover a chest CT for lung cancer screening since you have been smoking > 30 years  Call your insurer to see if they cover Colonoscopy vs Colo guard kit  Continue to monitor your blood pressure

## 2016-05-10 NOTE — Telephone Encounter (Signed)
Because  the pt has copd and wheezing and sob.  He doesn't qualify

## 2016-05-10 NOTE — Telephone Encounter (Signed)
Forwarding to [employer]

## 2016-05-10 NOTE — Telephone Encounter (Signed)
So does he qualify for regular chest CT then?

## 2016-05-10 NOTE — Telephone Encounter (Signed)
Insurance will cover chest CT for lung cancer screening after deductible however a prior Duane Alexander is needed. Call [phone] for prior auth. Colonoscopy is covered at 100% if it is routine. Pt would like morning appoints if possible when scheduling these.

## 2016-05-11 ENCOUNTER — Telehealth: Payer: Self-pay

## 2016-05-11 ENCOUNTER — Other Ambulatory Visit: Payer: Self-pay | Admitting: Medical

## 2016-05-11 ENCOUNTER — Other Ambulatory Visit: Payer: Self-pay

## 2016-05-11 DIAGNOSIS — R062 Wheezing: Secondary | ICD-10-CM

## 2016-05-11 DIAGNOSIS — F17200 Nicotine dependence, unspecified, uncomplicated: Secondary | ICD-10-CM

## 2016-05-11 DIAGNOSIS — J449 Chronic obstructive pulmonary disease, unspecified: Secondary | ICD-10-CM

## 2016-05-11 DIAGNOSIS — Z801 Family history of malignant neoplasm of trachea, bronchus and lung: Secondary | ICD-10-CM

## 2016-05-11 MED ORDER — NAPROXEN 500 MG PO TABS: 500.0000 mg | ORAL_TABLET | Freq: Two times a day (BID) | ORAL | 0 refills | Status: DC

## 2016-05-11 NOTE — Telephone Encounter (Signed)
Called and l/m for pt about his ct scan at [facility]. For Monday 05/16/2016 @ 11:30 am  @ the [facility] . Then he come in @ 2:15 pm to see Audelia Acton for an appt.

## 2016-05-11 NOTE — Telephone Encounter (Signed)
Sent rx to pharmacy

## 2016-05-11 NOTE — Telephone Encounter (Signed)
Duane Alexander called requesting refill of Naproxen to be called to [facility] [name]

## 2016-05-16 ENCOUNTER — Other Ambulatory Visit: Payer: Self-pay | Admitting: Medical

## 2016-05-16 ENCOUNTER — Ambulatory Visit (INDEPENDENT_AMBULATORY_CARE_PROVIDER_SITE_OTHER): Payer: BLUE CROSS/BLUE SHIELD | Admitting: Medical

## 2016-05-16 ENCOUNTER — Ambulatory Visit: Payer: BLUE CROSS/BLUE SHIELD

## 2016-05-16 ENCOUNTER — Encounter: Payer: Self-pay | Admitting: Medical

## 2016-05-16 ENCOUNTER — Encounter: Payer: BLUE CROSS/BLUE SHIELD | Admitting: Medical

## 2016-05-16 VITALS — BP 122/74 | HR 55 | Wt 186.4 lb

## 2016-05-16 DIAGNOSIS — R109 Unspecified abdominal pain: Secondary | ICD-10-CM

## 2016-05-16 DIAGNOSIS — M549 Dorsalgia, unspecified: Secondary | ICD-10-CM | POA: Diagnosis not present

## 2016-05-16 DIAGNOSIS — Z1211 Encounter for screening for malignant neoplasm of colon: Secondary | ICD-10-CM

## 2016-05-16 DIAGNOSIS — Z0000 Encounter for general adult medical examination without abnormal findings: Secondary | ICD-10-CM

## 2016-05-16 DIAGNOSIS — Z125 Encounter for screening for malignant neoplasm of prostate: Secondary | ICD-10-CM

## 2016-05-16 DIAGNOSIS — R030 Elevated blood-pressure reading, without diagnosis of hypertension: Secondary | ICD-10-CM

## 2016-05-16 DIAGNOSIS — R6883 Chills (without fever): Secondary | ICD-10-CM

## 2016-05-16 DIAGNOSIS — R110 Nausea: Secondary | ICD-10-CM

## 2016-05-16 LAB — POCT URINALYSIS DIPSTICK
BILIRUBIN UA: NEGATIVE
Blood, UA: NEGATIVE
Glucose, UA: NEGATIVE
KETONES UA: NEGATIVE
LEUKOCYTES UA: NEGATIVE
NITRITE UA: NEGATIVE
PH UA: 8 (ref 5.0–8.0)
PROTEIN UA: NEGATIVE
Spec Grav, UA: 1.015 (ref 1.010–1.025)
Urobilinogen, UA: NEGATIVE E.U./dL — AB

## 2016-05-16 MED ORDER — HYDROCODONE-ACETAMINOPHEN 5-325 MG PO TABS: 1.0000 | ORAL_TABLET | Freq: Four times a day (QID) | ORAL | 0 refills | Status: DC | PRN

## 2016-05-16 MED ORDER — ONDANSETRON HCL 4 MG PO TABS: 4.0000 mg | ORAL_TABLET | Freq: Three times a day (TID) | ORAL | 0 refills | Status: DC | PRN

## 2016-05-16 NOTE — Progress Notes (Signed)
Subjective:  Duane Alexander is a 61 y.o. male who was originally scheduled for physical, but developed significant back pain this morning.  He already gave blood for physical labs today.    He notes this morning out of the blue with significant right mid back pain, right side only.   Sharp stabbing pain, dry heaved once this morning, feels cold, chills.  Denies fever, no other urinary or GI changes or symptoms.  Had normal BM yesterday.   Drinks a lot of coffee, but has drank some water this morning.  No prior similar pian.  No hx/o renal stone or shingles.   No vomiting, no diarrhea.     Patient does not have a history of recurrent UTI. Patient does not have a history of pyelonephritis.  No other aggravating or relieving factors.  No other c/o.  Past Medical History:  Diagnosis Date  . COPD (chronic obstructive pulmonary disease) ([place])   . Smoker   . Wears glasses     ROS as in subjective  Reviewed allergies, medications, past medical, surgical, and social history.    Objective: BP 122/74   Pulse (!) 55   Wt 186 lb 6.4 oz (84.6 kg)   SpO2 99%   BMI 27.53 kg/m   General appearance: alert, no distress, WD/WN, male, in some pain, lying on exam table Skin: warm, dry no rash Abdomen: +bs, soft, non tender, non distended, no masses, no hepatomegaly, no splenomegaly, no bruits Back: no CVA tenderness, no pain with ROM, non tender to palpation GU: deferred HENT unremarkable Lungs clear Heart RRR, normal s1, s2, no murmurs No edema      Assessment: Encounter Diagnoses  Name Primary?  . Flank pain Yes  . Nausea   . Chills   . Acute right-sided back pain, unspecified back location      Plan: Discussed symptoms, possible differential including renal stone, pyelo, UTI, shingles, or other.  Etiology not clear.  hydrate well, can use medication below.  Will call tomorrow with lab results from physical labs, and he will reschedule for physical given the acute issue today, but if  new symptoms, recheck sooner.    Discussed symptoms and signs of renal stone, shingles, infection or other that would prompt call back or recheck.    [name] was seen today for pain in back.  Diagnoses and all orders for this visit:  Flank pain -     Urinalysis Dipstick -     Urine culture  Nausea -     Urinalysis Dipstick -     Urine culture  Chills -     Urine culture  Acute right-sided back pain, unspecified back location -     Urine culture  Other orders -     HYDROcodone-acetaminophen (NORCO) 5-325 MG tablet; Take 1 tablet by mouth every 6 (six) hours as needed for moderate pain. -     ondansetron (ZOFRAN) 4 MG tablet; Take 1 tablet (4 mg total) by mouth every 8 (eight) hours as needed for nausea or vomiting.

## 2016-05-17 DIAGNOSIS — Z0000 Encounter for general adult medical examination without abnormal findings: Secondary | ICD-10-CM | POA: Diagnosis not present

## 2016-05-17 LAB — COMPREHENSIVE METABOLIC PANEL
ALK PHOS: 51 U/L (ref 40–115)
ALT: 37 U/L (ref 9–46)
AST: 41 U/L — ABNORMAL HIGH (ref 10–35)
Albumin: 4.4 g/dL (ref 3.6–5.1)
BILIRUBIN TOTAL: 1.3 mg/dL — AB (ref 0.2–1.2)
BUN: 16 mg/dL (ref 7–25)
CO2: 26 mmol/L (ref 20–31)
CREATININE: 0.84 mg/dL (ref 0.70–1.25)
Calcium: 9.8 mg/dL (ref 8.6–10.3)
Chloride: 102 mmol/L (ref 98–110)
Glucose, Bld: 103 mg/dL — ABNORMAL HIGH (ref 65–99)
POTASSIUM: 4.1 mmol/L (ref 3.5–5.3)
Sodium: 137 mmol/L (ref 135–146)
TOTAL PROTEIN: 8.3 g/dL — AB (ref 6.1–8.1)

## 2016-05-17 LAB — CBC WITH DIFFERENTIAL/PLATELET
BASOS ABS: 0 {cells}/uL (ref 0–200)
Basophils Relative: 0 %
EOS PCT: 2 %
Eosinophils Absolute: 138 cells/uL (ref 15–500)
HCT: 45.5 % (ref 38.5–50.0)
Hemoglobin: 16.3 g/dL (ref 13.2–17.1)
LYMPHS PCT: 29 %
Lymphs Abs: 2001 cells/uL (ref 850–3900)
MCH: 34.9 pg — AB (ref 27.0–33.0)
MCHC: 35.8 g/dL (ref 32.0–36.0)
MCV: 97.4 fL (ref 80.0–100.0)
MONOS PCT: 10 %
MPV: 9.8 fL (ref 7.5–12.5)
Monocytes Absolute: 690 cells/uL (ref 200–950)
NEUTROS PCT: 59 %
Neutro Abs: 4071 cells/uL (ref 1500–7800)
Platelets: 184 10*3/uL (ref 140–400)
RBC: 4.67 MIL/uL (ref 4.20–5.80)
RDW: 12.1 % (ref 11.0–15.0)
WBC: 6.9 10*3/uL (ref 4.0–10.5)

## 2016-05-17 LAB — LIPID PANEL
CHOLESTEROL: 206 mg/dL — AB (ref <200)
HDL: 77 mg/dL (ref >40)
LDL Cholesterol: 117 mg/dL — ABNORMAL HIGH (ref <100)
TRIGLYCERIDES: 58 mg/dL (ref <150)
Total CHOL/HDL Ratio: 2.7 Ratio (ref <5.0)
VLDL: 12 mg/dL (ref <30)

## 2016-05-17 LAB — PSA: PSA: 0.7 ng/mL (ref <=4.0)

## 2016-05-17 LAB — URINE CULTURE: ORGANISM ID, BACTERIA: NO GROWTH

## 2016-05-17 NOTE — Addendum Note (Signed)
Addended by: Tyrone Apple on: 05/17/2016 08:11 AM   Modules accepted: Orders

## 2016-05-18 ENCOUNTER — Other Ambulatory Visit: Payer: Self-pay | Admitting: Medical

## 2016-05-18 ENCOUNTER — Telehealth: Payer: Self-pay | Admitting: Medical

## 2016-05-18 DIAGNOSIS — M546 Pain in thoracic spine: Secondary | ICD-10-CM

## 2016-05-18 DIAGNOSIS — R109 Unspecified abdominal pain: Secondary | ICD-10-CM

## 2016-05-18 LAB — HEMOGLOBIN A1C
HEMOGLOBIN A1C: 5.1 % (ref <5.7)
MEAN PLASMA GLUCOSE: 100 mg/dL

## 2016-05-18 MED ORDER — CIPROFLOXACIN HCL 500 MG PO TABS: 500.0000 mg | ORAL_TABLET | Freq: Two times a day (BID) | ORAL | 0 refills | Status: DC

## 2016-05-18 MED ORDER — HYDROCODONE-ACETAMINOPHEN 5-325 MG PO TABS: 1.0000 | ORAL_TABLET | Freq: Four times a day (QID) | ORAL | 0 refills | Status: DC | PRN

## 2016-05-18 NOTE — Telephone Encounter (Signed)
Called Pt isn't having an burning with urination  Or rash.  Again pain is same hasn't gotten better and not worse.

## 2016-05-18 NOTE — Telephone Encounter (Signed)
Ok.  Since not improved, have him begin Antibiotic Cipro in the event there is infection brewing given his exam and symptoms.  We had requested CT chest lung cancer screen.  When is this scheduled?  If its not til next week, we can offer CT chest and abdomen given the flank pain.   This could take the place of chest CT lung cancer screen.

## 2016-05-18 NOTE — Telephone Encounter (Signed)
Pt's fiance, Lattie Haw, called stating that pt still having quite a bit of pain from kidney stone so he was not able to go to work today. Requesting aother work note to take him out of work longer this week. Also requesting refill on pain med, only has 1 pain pill left. Call Lattie Haw back at [phone].

## 2016-05-18 NOTE — Telephone Encounter (Signed)
Called and spoke with pt about this made him an appt to come in to see dr.lalonde  On Thursday.

## 2016-05-18 NOTE — Telephone Encounter (Signed)
If pain is unchanged or worse, we may want to scan for renal stone.  Any rash, any burning or pain with urination given the urine pH higher than normal?

## 2016-05-19 ENCOUNTER — Ambulatory Visit
Admission: RE | Admit: 2016-05-19 | Discharge: 2016-05-19 | Disposition: A | Discharge status: Home / Self Care | Payer: BLUE CROSS/BLUE SHIELD | Source: Ambulatory Visit | Attending: Medical | Admitting: Medical

## 2016-05-19 ENCOUNTER — Ambulatory Visit (INDEPENDENT_AMBULATORY_CARE_PROVIDER_SITE_OTHER): Payer: BLUE CROSS/BLUE SHIELD | Admitting: Family Medicine

## 2016-05-19 DIAGNOSIS — R109 Unspecified abdominal pain: Secondary | ICD-10-CM | POA: Diagnosis not present

## 2016-05-19 DIAGNOSIS — J9811 Atelectasis: Secondary | ICD-10-CM | POA: Diagnosis not present

## 2016-05-19 DIAGNOSIS — J449 Chronic obstructive pulmonary disease, unspecified: Secondary | ICD-10-CM

## 2016-05-19 DIAGNOSIS — M546 Pain in thoracic spine: Secondary | ICD-10-CM

## 2016-05-19 DIAGNOSIS — F17200 Nicotine dependence, unspecified, uncomplicated: Secondary | ICD-10-CM

## 2016-05-19 DIAGNOSIS — I700 Atherosclerosis of aorta: Secondary | ICD-10-CM

## 2016-05-19 DIAGNOSIS — R062 Wheezing: Secondary | ICD-10-CM

## 2016-05-19 DIAGNOSIS — Z801 Family history of malignant neoplasm of trachea, bronchus and lung: Secondary | ICD-10-CM

## 2016-05-19 NOTE — Progress Notes (Signed)
   Subjective:    Patient ID: Duane Alexander, male    DOB: 1955/06/19, 61 y.o.   MRN: [id number]  HPI He is here for follow-up visit. He recently had CT of his chest as well as x-rays done. Presently he is having no chest pain, shortness of breath, CVA pain. He apparently did have symptoms consistent with the stone.   Review of Systems     Objective:   Physical Exam Alert and in no distress. Urine dipstick was negative. Review of the x-rays and CT scan really showed no major problems. Atherosclerosis was noted.       Assessment & Plan:  Aortic atherosclerosis (HCC)  Flank pain At this time he is having no difficulty. He will continue on his present medications. He is to follow-up for complete examination in near future. He does have pain medication in case the CVA pain comes back.

## 2016-05-30 ENCOUNTER — Other Ambulatory Visit: Payer: Self-pay | Admitting: Medical

## 2016-06-01 MED ORDER — NAPROXEN 500 MG PO TABS: 500.0000 mg | ORAL_TABLET | Freq: Two times a day (BID) | ORAL | 0 refills | Status: DC

## 2016-06-02 ENCOUNTER — Encounter: Payer: Self-pay | Admitting: Medical

## 2016-06-02 ENCOUNTER — Other Ambulatory Visit: Payer: BLUE CROSS/BLUE SHIELD

## 2016-06-02 ENCOUNTER — Encounter (INDEPENDENT_AMBULATORY_CARE_PROVIDER_SITE_OTHER): Payer: Self-pay | Admitting: *Deleted

## 2016-06-02 ENCOUNTER — Other Ambulatory Visit: Payer: Self-pay

## 2016-06-02 ENCOUNTER — Encounter: Payer: Self-pay | Admitting: Family Medicine

## 2016-06-02 ENCOUNTER — Ambulatory Visit (INDEPENDENT_AMBULATORY_CARE_PROVIDER_SITE_OTHER): Payer: BLUE CROSS/BLUE SHIELD | Admitting: Medical

## 2016-06-02 VITALS — BP 148/82 | HR 64 | Ht 69.0 in | Wt 178.4 lb

## 2016-06-02 DIAGNOSIS — J449 Chronic obstructive pulmonary disease, unspecified: Secondary | ICD-10-CM

## 2016-06-02 DIAGNOSIS — I10 Essential (primary) hypertension: Secondary | ICD-10-CM | POA: Diagnosis not present

## 2016-06-02 DIAGNOSIS — Z125 Encounter for screening for malignant neoplasm of prostate: Secondary | ICD-10-CM | POA: Diagnosis not present

## 2016-06-02 DIAGNOSIS — Z0000 Encounter for general adult medical examination without abnormal findings: Secondary | ICD-10-CM | POA: Diagnosis not present

## 2016-06-02 DIAGNOSIS — Z801 Family history of malignant neoplasm of trachea, bronchus and lung: Secondary | ICD-10-CM | POA: Diagnosis not present

## 2016-06-02 DIAGNOSIS — M25512 Pain in left shoulder: Secondary | ICD-10-CM | POA: Diagnosis not present

## 2016-06-02 DIAGNOSIS — E785 Hyperlipidemia, unspecified: Secondary | ICD-10-CM

## 2016-06-02 DIAGNOSIS — I700 Atherosclerosis of aorta: Secondary | ICD-10-CM

## 2016-06-02 DIAGNOSIS — F17200 Nicotine dependence, unspecified, uncomplicated: Secondary | ICD-10-CM | POA: Diagnosis not present

## 2016-06-02 DIAGNOSIS — R7989 Other specified abnormal findings of blood chemistry: Secondary | ICD-10-CM | POA: Diagnosis not present

## 2016-06-02 DIAGNOSIS — Z23 Encounter for immunization: Secondary | ICD-10-CM

## 2016-06-02 DIAGNOSIS — Z1211 Encounter for screening for malignant neoplasm of colon: Secondary | ICD-10-CM | POA: Diagnosis not present

## 2016-06-02 DIAGNOSIS — G8929 Other chronic pain: Secondary | ICD-10-CM

## 2016-06-02 DIAGNOSIS — R945 Abnormal results of liver function studies: Secondary | ICD-10-CM

## 2016-06-02 DIAGNOSIS — E7800 Pure hypercholesterolemia, unspecified: Secondary | ICD-10-CM | POA: Insufficient documentation

## 2016-06-02 LAB — POCT URINALYSIS DIPSTICK
BILIRUBIN UA: NEGATIVE
GLUCOSE UA: NEGATIVE
Ketones, UA: NEGATIVE
Leukocytes, UA: NEGATIVE
NITRITE UA: NEGATIVE
Protein, UA: NEGATIVE
RBC UA: NEGATIVE
SPEC GRAV UA: 1.015 (ref 1.010–1.025)
Urobilinogen, UA: 0.2 E.U./dL
pH, UA: 7.5 (ref 5.0–8.0)

## 2016-06-02 MED ORDER — FLUTICASONE-SALMETEROL 250-50 MCG/DOSE IN AEPB: 1.0000 | INHALATION_SPRAY | Freq: Two times a day (BID) | RESPIRATORY_TRACT | 11 refills | Status: DC

## 2016-06-02 MED ORDER — ALBUTEROL SULFATE HFA 108 (90 BASE) MCG/ACT IN AERS
2.0000 | INHALATION_SPRAY | Freq: Four times a day (QID) | RESPIRATORY_TRACT | 0 refills | Status: DC | PRN
Start: 2016-06-02 — End: 2016-07-19

## 2016-06-02 MED ORDER — LOVASTATIN 20 MG PO TABS: 20.0000 mg | ORAL_TABLET | Freq: Every day | ORAL | 1 refills | Status: DC

## 2016-06-02 MED ORDER — LISINOPRIL-HYDROCHLOROTHIAZIDE 10-12.5 MG PO TABS: 1.0000 | ORAL_TABLET | Freq: Every day | ORAL | 1 refills | Status: DC

## 2016-06-02 NOTE — Progress Notes (Signed)
Subjective:   HPI  Duane Alexander is a 61 y.o. male who presents for Chief Complaint  Patient presents with  . Annual Exam    physical , no other concerns     Medical care team includes: Jazlin Tapscott, [name], PA-C here for primary care Dentist Eye doctor  Concerns: I saw him recently for abdominal pain that resolved.  He feels fine today  Reviewed their medical, surgical, family, social, medication, and allergy history and updated chart as appropriate.  Past Medical History:  Diagnosis Date  . Aortic atherosclerosis ([place])   . COPD (chronic obstructive pulmonary disease) ([place])   . Family history of lung cancer   . Hypertension   . Smoker   . Wears glasses     No past surgical history on file.  Social History   Social History  . Marital status: Widowed    Spouse name: N/A  . Number of children: N/A  . Years of education: N/A   Occupational History  . Not on file.   Social History Main Topics  . Smoking status: Current Every Day Smoker    Packs/day: 1.00    Years: 45.00    Types: Cigarettes  . Smokeless tobacco: Never Used  . Alcohol use 6.0 oz/week    10 Cans of beer per week  . Drug use: No  . Sexual activity: Yes    Birth control/ protection: None   Other Topics Concern  . Not on file   Social History Narrative   Has girlfriend.  05/2016    Family History  Problem Relation Age of Onset  . Cancer Mother        lung  . Other Father        died in his 76s of stomach issues?  . Cancer Brother 73       lung  . Other Sister        GI related death  . Heart disease Neg Hx   . Hypertension Neg Hx   . Stroke Neg Hx      Current Outpatient Prescriptions:  .  albuterol (PROVENTIL HFA;VENTOLIN HFA) 108 (90 Base) MCG/ACT inhaler, Inhale 2 puffs into the lungs every 6 (six) hours as needed for wheezing or shortness of breath., Disp: 1 Inhaler, Rfl: 0 .  aspirin EC 81 MG tablet, Take 1 tablet (81 mg total) by mouth daily., Disp: 90 tablet, Rfl: 3 .   Fluticasone-Salmeterol (ADVAIR DISKUS) 250-50 MCG/DOSE AEPB, Inhale 1 puff into the lungs 2 (two) times daily., Disp: 1 each, Rfl: 11 .  Multiple Vitamin (MULTIVITAMIN) tablet, Take 1 tablet by mouth daily., Disp: , Rfl:  .  lisinopril-hydrochlorothiazide (PRINZIDE,ZESTORETIC) 10-12.5 MG tablet, Take 1 tablet by mouth daily., Disp: 90 tablet, Rfl: 1 .  lovastatin (MEVACOR) 20 MG tablet, Take 1 tablet (20 mg total) by mouth at bedtime., Disp: 90 tablet, Rfl: 1 .  naproxen (NAPROSYN) 500 MG tablet, Take 1 tablet (500 mg total) by mouth 2 (two) times daily with a meal. (Patient not taking: Reported on 06/02/2016), Disp: 30 tablet, Rfl: 0 .  ondansetron (ZOFRAN) 4 MG tablet, Take 1 tablet (4 mg total) by mouth every 8 (eight) hours as needed for nausea or vomiting. (Patient not taking: Reported on 06/02/2016), Disp: 20 tablet, Rfl: 0  No Known Allergies   Review of Systems Constitutional: -fever, -chills, -sweats, -unexpected weight change, -decreased appetite, -fatigue Allergy: -sneezing, -itching, -congestion Dermatology: -changing moles, --rash, -lumps ENT: -runny nose, -ear pain, -sore throat, -hoarseness, -sinus pain, -teeth  pain, - ringing in ears, -hearing loss, -nosebleeds Cardiology: -chest pain, -palpitations, -swelling, -difficulty breathing when lying flat, -waking up short of breath Respiratory: -cough, -shortness of breath, -difficulty breathing with exercise or exertion, -wheezing, -coughing up blood Gastroenterology: -abdominal pain, -nausea, -vomiting, -diarrhea, -constipation, -blood in stool, -changes in bowel movement, -difficulty swallowing or eating Hematology: -bleeding, -bruising  Musculoskeletal: -joint aches, -muscle aches, -joint swelling, -back pain, -neck pain, -cramping, -changes in gait Ophthalmology: denies vision changes, eye redness, itching, discharge Urology: -burning with urination, -difficulty urinating, -blood in urine, -urinary frequency, -urgency,  -incontinence Neurology: -headache, -weakness, -tingling, -numbness, -memory loss, -falls, -dizziness Psychology: -depressed mood, -agitation, -sleep problems     Objective:   BP (!) 148/82   Pulse 64   Ht 5\' 9"  (1.753 m)   Wt 178 lb 6.4 oz (80.9 kg)   SpO2 98%   BMI 26.35 kg/m   BP Readings from Last 3 Encounters:  06/02/16 (!) 148/82  05/16/16 122/74  05/10/16 (!) 162/110   Wt Readings from Last 3 Encounters:  06/02/16 178 lb 6.4 oz (80.9 kg)  05/16/16 186 lb 6.4 oz (84.6 kg)  05/10/16 190 lb 6.4 oz (86.4 kg)   General appearance: alert, no distress, WD/WN, Caucasian male Skin: tattoos bilat forearms, scattered crusted small round lesions on both dorsal hands from recent irritation and excoriations, no worrisome lesions HEENT: normocephalic, conjunctiva/corneas normal, sclerae anicteric, PERRLA, EOMi, nares patent, no discharge or erythema, pharynx normal Oral cavity: MMM, tongue normal, teeth normal Neck: supple, no lymphadenopathy, no thyromegaly, no masses, normal ROM, no bruits Chest: non tender, normal shape and expansion Heart: RRR, normal S1, S2, no murmurs Lungs: CTA bilaterally, no wheezes, rhonchi, or rales Abdomen: +bs, soft, non tender, non distended, no masses, no hepatomegaly, no splenomegaly, no bruits Back: non tender, normal ROM, no scoliosis Musculoskeletal: upper extremities non tender, no obvious deformity, normal ROM throughout, lower extremities non tender, no obvious deformity, normal ROM throughout Extremities: no edema, no cyanosis, no clubbing Pulses: 2+ symmetric, upper and lower extremities, normal cap refill Neurological: alert, oriented x 3, CN2-12 intact, strength normal upper extremities and lower extremities, sensation normal throughout, DTRs 2+ throughout, no cerebellar signs, gait normal Psychiatric: normal affect, behavior normal, pleasant  GU: normal male external genitalia,circumcised, nontender, no masses, no hernia, no  lymphadenopathy Rectal: anus normal tone, prostate WNL   Assessment and Plan :    Encounter Diagnoses  Name Primary?  . Routine general medical examination at a health care facility Yes  . Aortic atherosclerosis ([place])   . Hypertension, unspecified type   . Chronic obstructive pulmonary disease, unspecified COPD type ([place])   . Chronic left shoulder pain   . Family history of lung cancer   . Screen for colon cancer   . Screening for prostate cancer   . Smoking   . Hyperlipidemia, unspecified hyperlipidemia type   . Elevated LFTs     Physical exam - discussed and counseled on healthy lifestyle, diet, exercise, preventative care, vaccinations, sick and well care, proper use of emergency dept and after hours care, and addressed their concerns.    Health screening: See your eye doctor yearly for routine vision care. See your dentist yearly for routine dental care including hygiene visits twice yearly.  Cancer screening Discussed colonoscopy screening age 10yo unless higher risk for earlier screening.  Referral for 1st colonoscopy Discussed PSA, prostate exam, and prostate cancer screening risks/benefits.   Discussed prostate symptoms as well.  Prostate screening performed: Yes  Vaccinations: Counseled on  the following vaccines:   Influenza  I recommend you have a shingles vaccine to help prevent shingles or herpes zoster outbreak.   Please call your insurer to inquire about coverage for the Shingrix vaccine given in 2 doses.   Some insurers cover this vaccine after age 77, some cover this after age 57.  If your insurer covers this, then call to schedule appointment to have this vaccine here.  Counseled on the Tdap (tetanus, diptheria, and acellular pertussis) vaccine.  Vaccine information sheet given. Tdap vaccine given after consent obtained.  Counseled on the pneumococcal vaccine.  Vaccine information sheet given.  Pneumococcal vaccine PPSV23 given after consent obtained.  Acute  issues discussed: none  Separate significant chronic issues discussed: Counseled on tobacco use.  He is not ready to quit COPD - c/t Advair which he has been seeing improvement, albuterol prn HTN - change BP medication to mornings. Hyperlipidemia, atherosclerosis - discussed his cardiac risk.  Stop tobacco, begin Mevacor, c/t aspirin QHS Elevated LFTs - plan to repeat hepatic panel in 4-6 wk, nurse visit  Oral was seen today for annual exam.  Diagnoses and all orders for this visit:  Routine general medical examination at a health care facility -     Urinalysis Dipstick  Aortic atherosclerosis ([place])  Hypertension, unspecified type  Chronic obstructive pulmonary disease, unspecified COPD type ([place])  Chronic left shoulder pain  Family history of lung cancer  Screen for colon cancer -     Ambulatory referral to Gastroenterology  Screening for prostate cancer  Smoking  Hyperlipidemia, unspecified hyperlipidemia type  Elevated LFTs -     Hepatic function panel; Future  Other orders -     lovastatin (MEVACOR) 20 MG tablet; Take 1 tablet (20 mg total) by mouth at bedtime. -     Fluticasone-Salmeterol (ADVAIR DISKUS) 250-50 MCG/DOSE AEPB; Inhale 1 puff into the lungs 2 (two) times daily. -     albuterol (PROVENTIL HFA;VENTOLIN HFA) 108 (90 Base) MCG/ACT inhaler; Inhale 2 puffs into the lungs every 6 (six) hours as needed for wheezing or shortness of breath.    Follow-up pending labs, yearly for physical

## 2016-06-02 NOTE — Patient Instructions (Addendum)
Health screening: See your eye doctor yearly for routine vision care. See your dentist yearly for routine dental care including hygiene visits twice yearly.  Cancer screening Discussed colonoscopy screening age 63yo unless higher risk for earlier screening.  Referral for 1st colonoscopy We did a prostate screen today.  Everything was normal  Vaccinations:  Get a flu shot yearly in September  I recommend you have a shingles vaccine to help prevent shingles or herpes zoster outbreak.   Please call your insurer to inquire about coverage for the Shingrix vaccine given in 2 doses.   Some insurers cover this vaccine after age 80, some cover this after age 57.  If your insurer covers this, then call to schedule appointment to have this vaccine here.  We updated your Tdap (tetanus, diptheria, and acellular pertussis) vaccine.  This is good for 10 years  We updated your pneumococcal vaccine. You will need a Prevnar 13 vaccine at age 23yo  Acute issues discussed: none  STOP SMOKING!  High blood pressure - change your blood pressure medication to the morning time.  Atherosclerosis - begin Mevacor cholesterol mediation along with Aspirin 81mg  nightly at bedtime  Plan to return in 4-6 weeks for labs given the recent elevated liver tests  Wear Sunblock!    Atherosclerosis Atherosclerosis is narrowing and hardening of the blood vessels (arteries). Arteries are tubes that carry blood that contains oxygen from the heart to all parts of the body. Arteries can become narrow or clogged with a buildup of fat, cholesterol, calcium, or other substances (plaque). Plaque decreases the amount of blood that can flow through the artery. Atherosclerosis can affect any artery in the body, including:  Heart arteries (coronary artery disease), which may cause heart attack.  Brain arteries, which may cause stroke.  Leg, arm, and pelvis arteries (peripheral artery disease), which may cause pain and  numbness.  Kidney arteries, which may cause kidney (renal) failure.  Treatment may slow the disease and prevent further damage to the heart, brain, peripheral arteries, and kidneys. What are the causes? Atherosclerosis develops when plaque forms in an artery. This damages the inside wall of the artery. Over time, the plaque grows and hardens. It may break through the artery wall. This causes a blood clot to form over the break, which narrows the artery more. The clot may also break loose and travel to other arteries, causing more damage. What increases the risk? This condition is more likely to develop in people who:  Are middle age or older.  Have a family history of atherosclerosis.  Have high cholesterol.  Have high blood fats (triglycerides).  Have diabetes.  Are overweight.  Smoke tobacco.  Do not exercise enough.  Have a substance in the blood that indicates increased levels of inflammation in the body (C-reactive protein, or CRP).  Have sleep apnea.  Are stressed.  Drink too much alcohol.  What are the signs or symptoms? This condition may not cause any symptoms. If you do have symptoms, they are caused by damage to an area of your body that is not getting enough blood. The following symptoms are possible:  Coronary artery disease may cause chest pain and shortness of breath.  Decreased blood supply to your brain may cause a stroke. Signs and symptoms of stroke may include sudden: ? Weakness on one side of the body. ? Confusion. ? Changes in vision. ? Inability to speak or understand speech. ? Loss of balance, coordination, or ability to walk. ? Severe headache. ?  Loss of consciousness.  Peripheral artery disease may cause pain and numbness, often in the legs and hips.  Renal failure may cause fatigue, nausea, swelling, and itchy skin.  How is this diagnosed? This condition is diagnosed based on your medical history and a physical exam. During the exam,  your health care provider will check your pulses and listen for a "whooshing" sound over your arteries (bruit). You may have tests, such as:  Blood tests to check your levels of cholesterol, triglycerides, and CRP.  Electrocardiogram (ECG) to check for heart damage.  Chest X-ray to see if your heart is enlarged, which is a sign of heart failure.  Stress test to see how your heart reacts to exercise.  Echocardiogram to get images of the inside of your heart.  Ankle-Brachial index to compare blood pressure in your arms to blood pressure in your ankles.  Ultrasound of your peripheral arteries to check blood flow.  CT scan to check for damage to your heart or brain.  X-rays of blood vessels after dye has been injected (angiogram) to check blood flow.  How is this treated? Treatment starts with lifestyle changes, which may include:  Changing your diet.  Losing weight.  Reducing stress.  Exercising and being more physically active.  Not smoking.  You also may need medicine to:  Lower triglycerides and cholesterol.  Lower and control blood pressure.  Prevent blood clots.  Lower inflammation in your body.  Lower and control your blood sugar.  Sometimes, surgery is needed to remove plaque, widen your artery, or create a new path for your blood (bypass). Surgical treatment may include:  Removing plaque from an artery (endarterectomy).  Opening a narrowed heart artery (angioplasty).  Heart or peripheral artery bypass graft surgery.  Follow these instructions at home: Lifestyle   Eat a heart-healthy diet. Talk to your health care provider or a diet specialist (dietitian) if you need help. A heart-healthy diet includes: ? Limiting unhealthy fats and increasing healthy fats. Some examples of healthy fats are olive oil and canola oil. ? Eating plant-based foods, such as fruits, vegetables, nuts, legumes, and whole grains.  Follow an exercise program as told by your  health care provider.  Maintain a healthy weight. Lose weight if directed by your health care provider.  Rest when you are tired.  Learn to manage your stress.  Do not use any tobacco products, such as cigarettes, chewing tobacco, and e-cigarettes. If you need help quitting, ask your health care provider.  Limit alcohol intake to no more than 1 drink a day for nonpregnant women and 2 drinks a day for men. One drink equals 12 oz of beer, 5 oz of wine, or 1 oz of hard liquor.  Do not abuse drugs. General instructions  Take over-the-counter and prescription medicines only as told by your health care provider.  Manage other health conditions as told by your health care provider.  Keep all follow-up visits as told by your health care provider. This is important. Contact a health care provider if:  You have chest pain or discomfort. This includes squeezing chest pain that may feel like indigestion (angina).  You have shortness of breath.  You have an irregular heartbeat.  You have unexplained fatigue.  You have unexplained pain or numbness in an arm, leg, or hip.  You have nausea, swelling of your hands or feet, and itchy skin. Get help right away if:  You have symptoms of a heart attack, such as: ? Chest pain. ?  Shortness of breath. ? Pain in your neck, jaw, arms, back, or stomach. ? Cold sweat. ? Nausea. ? Light-headedness.  You have symptoms of a stroke, such as sudden: ? Weakness on one side of your body. ? Confusion. ? Changes in vision. ? Inability to speak or understand speech. ? Loss of balance, coordination, or ability to walk. ? Severe headache. ? Loss of consciousness. These symptoms may represent a serious problem that is an emergency. Do not wait to see if the symptoms will go away. Get medical help right away. Call your local emergency services (911 in the [place] Do not drive yourself to the hospital. This information is not intended to replace advice  given to you by your health care provider. Make sure you discuss any questions you have with your health care provider. Document Released: 03/12/2003 Document Revised: 05/28/2015 Document Reviewed: 11/10/2014 Elsevier Interactive Patient Education  [employer].

## 2016-06-04 ENCOUNTER — Encounter: Payer: Self-pay | Admitting: Medical

## 2016-06-07 ENCOUNTER — Encounter: Payer: Self-pay | Admitting: Medical

## 2016-06-07 ENCOUNTER — Other Ambulatory Visit: Payer: Self-pay | Admitting: Medical

## 2016-06-07 MED ORDER — SILDENAFIL CITRATE 50 MG PO TABS: 50.0000 mg | ORAL_TABLET | ORAL | 2 refills | Status: DC | PRN

## 2016-06-07 MED ORDER — OMEPRAZOLE 40 MG PO CPDR: 40.0000 mg | DELAYED_RELEASE_CAPSULE | Freq: Every day | ORAL | 2 refills | Status: DC

## 2016-06-17 ENCOUNTER — Other Ambulatory Visit (INDEPENDENT_AMBULATORY_CARE_PROVIDER_SITE_OTHER): Payer: Self-pay | Admitting: *Deleted

## 2016-06-17 DIAGNOSIS — Z1211 Encounter for screening for malignant neoplasm of colon: Secondary | ICD-10-CM

## 2016-06-17 DIAGNOSIS — Z0000 Encounter for general adult medical examination without abnormal findings: Secondary | ICD-10-CM | POA: Insufficient documentation

## 2016-06-26 ENCOUNTER — Other Ambulatory Visit: Payer: Self-pay | Admitting: Medical

## 2016-06-27 MED ORDER — NAPROXEN 500 MG PO TABS: 500.0000 mg | ORAL_TABLET | Freq: Two times a day (BID) | ORAL | 1 refills | Status: DC

## 2016-06-29 ENCOUNTER — Other Ambulatory Visit: Payer: BLUE CROSS/BLUE SHIELD

## 2016-07-01 ENCOUNTER — Ambulatory Visit (INDEPENDENT_AMBULATORY_CARE_PROVIDER_SITE_OTHER): Payer: BLUE CROSS/BLUE SHIELD | Admitting: Medical

## 2016-07-01 ENCOUNTER — Encounter: Payer: Self-pay | Admitting: Medical

## 2016-07-01 VITALS — BP 110/70 | HR 90 | Temp 98.1°F | Resp 18 | Wt 163.8 lb

## 2016-07-01 DIAGNOSIS — R7989 Other specified abnormal findings of blood chemistry: Secondary | ICD-10-CM

## 2016-07-01 DIAGNOSIS — E785 Hyperlipidemia, unspecified: Secondary | ICD-10-CM | POA: Diagnosis not present

## 2016-07-01 DIAGNOSIS — J449 Chronic obstructive pulmonary disease, unspecified: Secondary | ICD-10-CM

## 2016-07-01 DIAGNOSIS — R945 Abnormal results of liver function studies: Principal | ICD-10-CM

## 2016-07-01 NOTE — Progress Notes (Signed)
Subjective: Chief Complaint  Patient presents with  . Multiple Concerns    sluggish at work- not sure if heat or medications. congestion in chest- productive cough with yellow phelgm.    Here for f/u.   Last visit has elevated serum protein, elevated LFTs.   He is compliant with medications including statin started last visit about 7-8 weeks ago.   He notes remote hx/o hepatitis but unsure which type.   He has had some fatigue lately.  Works out in the hot sun all day.  Is compliant with Advair 2 puffs BID and albuterol prn.  No other aggravating or relieving factors. No other complaint.   Past Medical History:  Diagnosis Date  . Aortic atherosclerosis ([place])   . COPD (chronic obstructive pulmonary disease) ([place])   . Family history of lung cancer   . Hypertension   . Smoker   . Wears glasses    Current Outpatient Prescriptions on File Prior to Visit  Medication Sig Dispense Refill  . albuterol (PROVENTIL HFA;VENTOLIN HFA) 108 (90 Base) MCG/ACT inhaler Inhale 2 puffs into the lungs every 6 (six) hours as needed for wheezing or shortness of breath. 1 Inhaler 0  . aspirin EC 81 MG tablet Take 1 tablet (81 mg total) by mouth daily. 90 tablet 3  . Fluticasone-Salmeterol (ADVAIR DISKUS) 250-50 MCG/DOSE AEPB Inhale 1 puff into the lungs 2 (two) times daily. 1 each 11  . lisinopril-hydrochlorothiazide (PRINZIDE,ZESTORETIC) 10-12.5 MG tablet Take 1 tablet by mouth daily. 90 tablet 1  . lovastatin (MEVACOR) 20 MG tablet Take 1 tablet (20 mg total) by mouth at bedtime. 90 tablet 1  . Multiple Vitamin (MULTIVITAMIN) tablet Take 1 tablet by mouth daily.    . naproxen (NAPROSYN) 500 MG tablet Take 1 tablet (500 mg total) by mouth 2 (two) times daily with a meal. 30 tablet 1  . omeprazole (PRILOSEC) 40 MG capsule Take 1 capsule (40 mg total) by mouth daily. 30 capsule 2  . ondansetron (ZOFRAN) 4 MG tablet Take 1 tablet (4 mg total) by mouth every 8 (eight) hours as needed for nausea or vomiting. 20  tablet 0  . sildenafil (VIAGRA) 50 MG tablet Take 1 tablet (50 mg total) by mouth as needed for erectile dysfunction. (Patient not taking: Reported on 07/01/2016) 10 tablet 2   No current facility-administered medications on file prior to visit.    ROS as in subjective  Objective: BP 110/70   Pulse 90   Temp 98.1 F (36.7 C) (Oral)   Resp 18   Wt 163 lb 12.8 oz (74.3 kg)   SpO2 96%   BMI 24.19 kg/m   General appearance: alert, no distress, WD/WN,  HEENT: normocephalic, sclerae anicteric, TMs pearly, nares patent, no discharge or erythema, pharynx normal Oral cavity: MMM, no lesions Neck: supple, no lymphadenopathy, no thyromegaly, no masses Heart: RRR, normal S1, S2, no murmurs Lungs: relatively clear, no wheezes, rhonchi, or rales Abdomen: +bs, soft, non tender, non distended, no masses, no hepatomegaly, no splenomegaly Pulses: 2+ symmetric, upper and lower extremities, normal cap refill Ext: no edema    Assessment: Encounter Diagnoses  Name Primary?  . Elevated LFTs Yes  . Chronic obstructive pulmonary disease, unspecified COPD type ([place])   . Hyperlipidemia, unspecified hyperlipidemia type     Plan: discussed his 05/2016 labs and visit.  He is compliant with statin and other medications.   Repeat labs today.  He notes recent weight loss is intervention.  C/t to monitor weights.   C/t  Advair, but increase albuterol frequent if needed.  He works out in the hot sun.  discussed keeping cool, cool mist, water intake, cold rags to stay cool and avoid heat exhaustion.  F/u pending labs  Less was seen today for multiple concerns.  Diagnoses and all orders for this visit:  Elevated LFTs -     Lipid panel -     Hepatic function panel -     Hepatitis C antibody -     Hepatitis B core antibody, IgM -     Hepatitis B surface antibody -     Hepatitis B surface antigen  Chronic obstructive pulmonary disease, unspecified COPD type ([facility]) -     Lipid panel -     Hepatic  function panel -     Hepatitis C antibody -     Hepatitis B core antibody, IgM -     Hepatitis B surface antibody -     Hepatitis B surface antigen  Hyperlipidemia, unspecified hyperlipidemia type -     Lipid panel -     Hepatic function panel -     Hepatitis C antibody -     Hepatitis B core antibody, IgM -     Hepatitis B surface antibody -     Hepatitis B surface antigen

## 2016-07-01 NOTE — Progress Notes (Signed)
Lab unable to gather blood- pt will return Monday am for lab redraw. [name]

## 2016-07-04 ENCOUNTER — Other Ambulatory Visit: Payer: Self-pay

## 2016-07-04 ENCOUNTER — Telehealth: Payer: Self-pay | Admitting: Medical

## 2016-07-04 NOTE — Telephone Encounter (Signed)
Pt's fiance called and stated that pt no longer has an pharmacy coverage thru local pharmacy. All RX must go thru [facility]. Pleased all refills on meds to [facility] with the exception of Viagra. Viagra not covered at all. She can be reached at [phone].

## 2016-07-04 NOTE — Telephone Encounter (Signed)
Called pt he said that he is good on refill on his medicines for right now. I told him to call us back when he needs a refill.

## 2016-07-19 ENCOUNTER — Other Ambulatory Visit: Payer: Self-pay | Admitting: Medical

## 2016-07-21 MED ORDER — ALBUTEROL SULFATE HFA 108 (90 BASE) MCG/ACT IN AERS: 2.0000 | INHALATION_SPRAY | Freq: Four times a day (QID) | RESPIRATORY_TRACT | 1 refills | Status: DC | PRN

## 2016-08-03 ENCOUNTER — Other Ambulatory Visit: Payer: Self-pay | Admitting: Medical

## 2016-08-04 NOTE — Telephone Encounter (Signed)
Can pt have a refill on this 

## 2016-08-25 ENCOUNTER — Ambulatory Visit: Payer: BLUE CROSS/BLUE SHIELD | Admitting: Family Medicine

## 2016-09-08 ENCOUNTER — Telehealth (INDEPENDENT_AMBULATORY_CARE_PROVIDER_SITE_OTHER): Payer: Self-pay | Admitting: *Deleted

## 2016-09-08 ENCOUNTER — Encounter (INDEPENDENT_AMBULATORY_CARE_PROVIDER_SITE_OTHER): Payer: Self-pay | Admitting: *Deleted

## 2016-09-08 NOTE — Telephone Encounter (Signed)
Patient needs trilyte 

## 2016-09-09 MED ORDER — PEG 3350-KCL-NA BICARB-NACL 420 G PO SOLR: 4000.0000 mL | Freq: Once | ORAL | 0 refills | Status: AC

## 2016-09-25 ENCOUNTER — Other Ambulatory Visit: Payer: Self-pay | Admitting: Medical

## 2016-09-26 NOTE — Telephone Encounter (Signed)
Can pt have a refill on this 

## 2016-09-28 ENCOUNTER — Telehealth (INDEPENDENT_AMBULATORY_CARE_PROVIDER_SITE_OTHER): Payer: Self-pay | Admitting: *Deleted

## 2016-09-28 NOTE — Telephone Encounter (Signed)
agree

## 2016-09-28 NOTE — Telephone Encounter (Signed)
Referring MD/PCP: shane tysinger   Procedure: tcs  Reason/Indication:  Screening, fam hx colon ca  Has patient had this procedure before?  no  If so, when, by whom and where?    Is there a family history of colon cancer?  Yes, half sister  Who?  What age when diagnosed?    Is patient diabetic?   no      Does patient have prosthetic heart valve or mechanical valve?  no  Do you have a pacemaker?  no  Has patient ever had endocarditis? no  Has patient had joint replacement within last 12 months?  no  Does patient tend to be constipated or take laxatives? no  Does patient have a history of alcohol/drug use?  no  Is patient on Coumadin, Plavix and/or Aspirin? yes  Medications: see epic  Allergies:  seeepic  Medication Adjustment per Dr Laural Golden: asa 2 days  Procedure date & time: 10/27/16 at 55

## 2016-12-01 ENCOUNTER — Other Ambulatory Visit: Payer: Self-pay | Admitting: Medical

## 2016-12-07 ENCOUNTER — Telehealth (INDEPENDENT_AMBULATORY_CARE_PROVIDER_SITE_OTHER): Payer: Self-pay | Admitting: *Deleted

## 2016-12-07 ENCOUNTER — Encounter (INDEPENDENT_AMBULATORY_CARE_PROVIDER_SITE_OTHER): Payer: Self-pay | Admitting: *Deleted

## 2016-12-07 NOTE — Telephone Encounter (Signed)
Patient needs trilyte 

## 2016-12-08 MED ORDER — PEG 3350-KCL-NA BICARB-NACL 420 G PO SOLR: 4000.0000 mL | Freq: Once | ORAL | 0 refills | Status: AC

## 2016-12-19 ENCOUNTER — Telehealth (INDEPENDENT_AMBULATORY_CARE_PROVIDER_SITE_OTHER): Payer: Self-pay | Admitting: *Deleted

## 2016-12-19 NOTE — Telephone Encounter (Signed)
agree

## 2016-12-19 NOTE — Telephone Encounter (Signed)
Referring MD/PCP: shane tysinger   Procedure: tcs  Reason/Indication:  Screening, fam hx colon ca  Has patient had this procedure before?  no             If so, when, by whom and where?    Is there a family history of colon cancer?  Yes, half sister             Who?  What age when diagnosed?    Is patient diabetic?   no                                                  Does patient have prosthetic heart valve or mechanical valve?  no  Do you have a pacemaker?  no  Has patient ever had endocarditis? no  Has patient had joint replacement within last 12 months?  no  Does patient tend to be constipated or take laxatives? no  Does patient have a history of alcohol/drug use?  no  Is patient on Coumadin, Plavix and/or Aspirin? yes  Medications: see epic  Allergies:  see epic  Medication Adjustment per Dr Laural Golden: asa 2 days  Procedure date & time: 01/19/17 at 4

## 2017-01-19 ENCOUNTER — Other Ambulatory Visit: Payer: Self-pay

## 2017-01-19 ENCOUNTER — Encounter (HOSPITAL_COMMUNITY): Payer: Self-pay | Admitting: *Deleted

## 2017-01-19 ENCOUNTER — Ambulatory Visit (HOSPITAL_COMMUNITY)
Admission: RE | Admit: 2017-01-19 | Discharge: 2017-01-19 | Disposition: A | Discharge status: Home / Self Care | Payer: BLUE CROSS/BLUE SHIELD | Source: Ambulatory Visit | Attending: Internal Medicine | Admitting: Internal Medicine

## 2017-01-19 ENCOUNTER — Encounter (HOSPITAL_COMMUNITY)
Admission: RE | Disposition: A | Discharge status: Home / Self Care | Payer: Self-pay | Source: Ambulatory Visit | Attending: Internal Medicine

## 2017-01-19 DIAGNOSIS — Z79899 Other long term (current) drug therapy: Secondary | ICD-10-CM | POA: Insufficient documentation

## 2017-01-19 DIAGNOSIS — I10 Essential (primary) hypertension: Secondary | ICD-10-CM | POA: Diagnosis not present

## 2017-01-19 DIAGNOSIS — I700 Atherosclerosis of aorta: Secondary | ICD-10-CM | POA: Diagnosis not present

## 2017-01-19 DIAGNOSIS — Z7982 Long term (current) use of aspirin: Secondary | ICD-10-CM | POA: Insufficient documentation

## 2017-01-19 DIAGNOSIS — Z0000 Encounter for general adult medical examination without abnormal findings: Secondary | ICD-10-CM | POA: Insufficient documentation

## 2017-01-19 DIAGNOSIS — Q438 Other specified congenital malformations of intestine: Secondary | ICD-10-CM | POA: Insufficient documentation

## 2017-01-19 DIAGNOSIS — Z1211 Encounter for screening for malignant neoplasm of colon: Secondary | ICD-10-CM | POA: Diagnosis not present

## 2017-01-19 DIAGNOSIS — K644 Residual hemorrhoidal skin tags: Secondary | ICD-10-CM | POA: Insufficient documentation

## 2017-01-19 DIAGNOSIS — Z801 Family history of malignant neoplasm of trachea, bronchus and lung: Secondary | ICD-10-CM | POA: Insufficient documentation

## 2017-01-19 DIAGNOSIS — K5730 Diverticulosis of large intestine without perforation or abscess without bleeding: Secondary | ICD-10-CM | POA: Diagnosis not present

## 2017-01-19 DIAGNOSIS — J449 Chronic obstructive pulmonary disease, unspecified: Secondary | ICD-10-CM | POA: Insufficient documentation

## 2017-01-19 DIAGNOSIS — D123 Benign neoplasm of transverse colon: Secondary | ICD-10-CM | POA: Diagnosis not present

## 2017-01-19 DIAGNOSIS — F17210 Nicotine dependence, cigarettes, uncomplicated: Secondary | ICD-10-CM | POA: Diagnosis not present

## 2017-01-19 HISTORY — PX: POLYPECTOMY: SHX5525

## 2017-01-19 HISTORY — PX: COLONOSCOPY: SHX5424

## 2017-01-19 SURGERY — COLONOSCOPY
Anesthesia: Moderate Sedation

## 2017-01-19 MED ORDER — MEPERIDINE HCL 50 MG/ML IJ SOLN
INTRAMUSCULAR | Status: AC
  Filled 2017-01-19: qty 1

## 2017-01-19 MED ORDER — MIDAZOLAM HCL 5 MG/5ML IJ SOLN
INTRAMUSCULAR | Status: DC | PRN
  Administered 2017-01-19 (×6): 2 mg via INTRAVENOUS

## 2017-01-19 MED ORDER — MIDAZOLAM HCL 5 MG/5ML IJ SOLN
INTRAMUSCULAR | Status: AC
  Filled 2017-01-19: qty 10

## 2017-01-19 MED ORDER — SODIUM CHLORIDE 0.9 % IV SOLN
INTRAVENOUS | Status: DC
  Administered 2017-01-19: 10:00:00 via INTRAVENOUS

## 2017-01-19 MED ORDER — MIDAZOLAM HCL 5 MG/5ML IJ SOLN
INTRAMUSCULAR | Status: AC
  Filled 2017-01-19: qty 5

## 2017-01-19 MED ORDER — MEPERIDINE HCL 50 MG/ML IJ SOLN
INTRAMUSCULAR | Status: DC | PRN
  Administered 2017-01-19 (×4): 25 mg via INTRAVENOUS

## 2017-01-19 MED ORDER — STERILE WATER FOR IRRIGATION IR SOLN
Status: DC | PRN
  Administered 2017-01-19: 2.5 mL

## 2017-01-19 NOTE — Op Note (Signed)
[facility] Patient Name: Duane Alexander Procedure Date: 01/19/2017 9:24 AM MRN: [id number] Date of Birth: 1955/09/10 Attending MD: Hildred Laser , MD CSN: [id number] Age: 62 Admit Type: Outpatient Procedure:                Colonoscopy Indications:              Screening for colorectal malignant neoplasm Providers:                Hildred Laser, MD, Janeece Riggers, RN, Lurline Del, RN Referring MD:             Dorothea Ogle, PA-C Medicines:                Meperidine 100 mg IV, Midazolam 12 mg IV Complications:            No immediate complications. Estimated Blood Loss:     Estimated blood loss was minimal. Procedure:                Pre-Anesthesia Assessment:                           - Prior to the procedure, a History and Physical                            was performed, and patient medications and                            allergies were reviewed. The patient's tolerance of                            previous anesthesia was also reviewed. The risks                            and benefits of the procedure and the sedation                            options and risks were discussed with the patient.                            All questions were answered, and informed consent                            was obtained. Prior Anticoagulants: The patient                            last took naproxen 14 days prior to the procedure.                            ASA Grade Assessment: II - A patient with mild                            systemic disease. After reviewing the risks and                            benefits, the patient was deemed in satisfactory  condition to undergo the procedure.                           After obtaining informed consent, the colonoscope                            was passed under direct vision. Throughout the                            procedure, the patient's blood pressure, pulse, and                            oxygen saturations  were monitored continuously. The                            [id number] ([id number]) scope was introduced through                            the anus and advanced to the the cecum, identified                            by appendiceal orifice and ileocecal valve. The                            colonoscopy was somewhat difficult due to                            significant looping. Successful completion of the                            procedure was aided by increasing the dose of                            sedation medication, changing the patient to a                            supine position and using manual pressure. The                            patient tolerated the procedure fairly well. The                            quality of the bowel preparation was good. The                            ileocecal valve, appendiceal orifice, and rectum                            were photographed. Scope In: 10:06:24 AM Scope Out: 10:29:18 AM Scope Withdrawal Time: 0 hours 10 minutes 14 seconds  Total Procedure Duration: 0 hours 22 minutes 54 seconds  Findings:      The perianal and digital rectal examinations were normal.      A 6 mm polyp was found in the splenic flexure. The  polyp was sessile.       The polyp was removed with a cold snare. Resection and retrieval were       complete.      Two small-mouthed diverticula were found in the sigmoid colon.      External hemorrhoids were found during retroflexion. The hemorrhoids       were medium-sized. Impression:               - One 6 mm polyp at the splenic flexure, removed                            with a cold snare. Resected and retrieved.                           - Diverticulosis in the sigmoid colon.                           - External hemorrhoids. Moderate Sedation:      Moderate (conscious) sedation was administered by the endoscopy nurse       and supervised by the endoscopist. The following parameters were       monitored: oxygen  saturation, heart rate, blood pressure, CO2       capnography and response to care. Total physician intraservice time was       45 minutes. Recommendation:           - Patient has a contact number available for                            emergencies. The signs and symptoms of potential                            delayed complications were discussed with the                            patient. Return to normal activities tomorrow.                            Written discharge instructions were provided to the                            patient.                           - High fiber diet today.                           - Continue present medications.                           - No aspirin, ibuprofen, naproxen, or other                            non-steroidal anti-inflammatory drugs for 3 days                            after polyp removal.                           -  Await pathology results.                           - Repeat colonoscopy is recommended. The                            colonoscopy date will be determined after pathology                            results from today's exam become available for                            review. Procedure Code(s):        --- Professional ---                           [phone], Colonoscopy, flexible; with removal of                            tumor(s), polyp(s), or other lesion(s) by snare                            technique                           99152, Moderate sedation services provided by the                            same physician or other qualified health care                            professional performing the diagnostic or                            therapeutic service that the sedation supports,                            requiring the presence of an independent trained                            observer to assist in the monitoring of the                            patient's level of consciousness and physiological                             status; initial 15 minutes of intraservice time,                            patient age 56 years or older                           [phone], Moderate sedation services; each additional                            15 minutes  intraservice time                           [phone], Moderate sedation services; each additional                            15 minutes intraservice time Diagnosis Code(s):        --- Professional ---                           Z12.11, Encounter for screening for malignant                            neoplasm of colon                           D12.3, Benign neoplasm of transverse colon (hepatic                            flexure or splenic flexure)                           K64.4, Residual hemorrhoidal skin tags                           K57.30, Diverticulosis of large intestine without                            perforation or abscess without bleeding CPT copyright 2016 American Medical Association. All rights reserved. The codes documented in this report are preliminary and upon coder review may  be revised to meet current compliance requirements. Hildred Laser, MD Hildred Laser, MD 01/19/2017 10:39:34 AM This report has been signed electronically. Number of Addenda: 0

## 2017-01-19 NOTE — Discharge Instructions (Signed)
No aspirin or NSAIDs for 3 days. Resume other medications as before. High-fiber diet. No driving for 24 hours. Physician will will call with biopsy results.   Please call Dr. Olevia Perches office if develops increased pain or redness in right arm due to prior IV infiltration, per Dr. Laural Golden.       Colonoscopy, Adult, Care After This sheet gives you information about how to care for yourself after your procedure. Your doctor may also give you more specific instructions. If you have problems or questions, call your doctor. Follow these instructions at home: General instructions   For the first 24 hours after the procedure: ? Do not drive or use machinery. ? Do not sign important documents. ? Do not drink alcohol. ? Do your daily activities more slowly than normal. ? Eat foods that are soft and easy to digest. ? Rest often.  Take over-the-counter or prescription medicines only as told by your doctor.  It is up to you to get the results of your procedure. Ask your doctor, or the department performing the procedure, when your results will be ready. To help cramping and bloating:  Try walking around.  Put heat on your belly (abdomen) as told by your doctor. Use a heat source that your doctor recommends, such as a moist heat pack or a heating pad. ? Put a towel between your skin and the heat source. ? Leave the heat on for 20-30 minutes. ? Remove the heat if your skin turns bright red. This is especially important if you cannot feel pain, heat, or cold. You can get burned. Eating and drinking  Drink enough fluid to keep your pee (urine) clear or pale yellow.  Return to your normal diet as told by your doctor. Avoid heavy or fried foods that are hard to digest.  Avoid drinking alcohol for as long as told by your doctor. Contact a doctor if:  You have blood in your poop (stool) 2-3 days after the procedure. Get help right away if:  You have more than a small amount of blood in  your poop.  You see large clumps of tissue (blood clots) in your poop.  Your belly is swollen.  You feel sick to your stomach (nauseous).  You throw up (vomit).  You have a fever.  You have belly pain that gets worse, and medicine does not help your pain.    Diverticulosis Diverticulosis is a condition that develops when small pouches (diverticula) form in the wall of the large intestine (colon). The colon is where water is absorbed and stool is formed. The pouches form when the inside layer of the colon pushes through weak spots in the outer layers of the colon. You may have a few pouches or many of them. What are the causes? The cause of this condition is not known. What increases the risk? The following factors may make you more likely to develop this condition:  Being older than age 13. Your risk for this condition increases with age. Diverticulosis is rare among people younger than age 44. By age 59, many people have it.  Eating a low-fiber diet.  Having frequent constipation.  Being overweight.  Not getting enough exercise.  Smoking.  Taking over-the-counter pain medicines, like aspirin and ibuprofen.  Having a family history of diverticulosis.  What are the signs or symptoms? In most people, there are no symptoms of this condition. If you do have symptoms, they may include:  Bloating.  Cramps in the abdomen.  Constipation  or diarrhea.  Pain in the lower left side of the abdomen.  How is this diagnosed? This condition is most often diagnosed during an exam for other colon problems. Because diverticulosis usually has no symptoms, it often cannot be diagnosed independently. This condition may be diagnosed by:  Using a flexible scope to examine the colon (colonoscopy).  Taking an X-ray of the colon after dye has been put into the colon (barium enema).  Doing a CT scan.  How is this treated? You may not need treatment for this condition if you have never  developed an infection related to diverticulosis. If you have had an infection before, treatment may include:  Eating a high-fiber diet. This may include eating more fruits, vegetables, and grains.  Taking a fiber supplement.  Taking a live bacteria supplement (probiotic).  Taking medicine to relax your colon.  Taking antibiotic medicines.  Follow these instructions at home:  Drink 6-8 glasses of water or more each day to prevent constipation.  Try not to strain when you have a bowel movement.  If you have had an infection before: ? Eat more fiber as directed by your health care provider or your diet and nutrition specialist (dietitian). ? Take a fiber supplement or probiotic, if your health care provider approves.  Take over-the-counter and prescription medicines only as told by your health care provider.  If you were prescribed an antibiotic, take it as told by your health care provider. Do not stop taking the antibiotic even if you start to feel better.  Keep all follow-up visits as told by your health care provider. This is important. Contact a health care provider if:  You have pain in your abdomen.  You have bloating.  You have cramps.  You have not had a bowel movement in 3 days. Get help right away if:  Your pain gets worse.  Your bloating becomes very bad.  You have a fever or chills, and your symptoms suddenly get worse.  You vomit.  You have bowel movements that are bloody or black.  You have bleeding from your rectum. Summary  Diverticulosis is a condition that develops when small pouches (diverticula) form in the wall of the large intestine (colon).  You may have a few pouches or many of them.  This condition is most often diagnosed during an exam for other colon problems.  If you have had an infection related to diverticulosis, treatment may include increasing the fiber in your diet, taking supplements, or taking medicines.   High-Fiber  Diet Fiber, also called dietary fiber, is a type of carbohydrate found in fruits, vegetables, whole grains, and beans. A high-fiber diet can have many health benefits. Your health care provider may recommend a high-fiber diet to help:  Prevent constipation. Fiber can make your bowel movements more regular.  Lower your cholesterol.  Relieve hemorrhoids, uncomplicated diverticulosis, or irritable bowel syndrome.  Prevent overeating as part of a weight-loss plan.  Prevent heart disease, type 2 diabetes, and certain cancers.  What is my plan? The recommended daily intake of fiber includes:  38 grams for men under age 75.  84 grams for men over age 67.  65 grams for women under age 45.  35 grams for women over age 37.  You can get the recommended daily intake of dietary fiber by eating a variety of fruits, vegetables, grains, and beans. Your health care provider may also recommend a fiber supplement if it is not possible to get enough fiber through your  diet. What do I need to know about a high-fiber diet?  Fiber supplements have not been widely studied for their effectiveness, so it is better to get fiber through food sources.  Always check the fiber content on thenutrition facts label of any prepackaged food. Look for foods that contain at least 5 grams of fiber per serving.  Ask your dietitian if you have questions about specific foods that are related to your condition, especially if those foods are not listed in the following section.  Increase your daily fiber consumption gradually. Increasing your intake of dietary fiber too quickly may cause bloating, cramping, or gas.  Drink plenty of water. Water helps you to digest fiber. What foods can I eat? Grains Whole-grain breads. Multigrain cereal. Oats and oatmeal. Brown rice. Barley. Bulgur wheat. [place]. Bran muffins. Popcorn. Rye wafer crackers. Vegetables Sweet potatoes. Spinach. Kale. Artichokes. Cabbage. Broccoli. Green  peas. Carrots. Squash. Fruits Berries. Pears. Apples. Oranges. Avocados. Prunes and raisins. Dried figs. Meats and Other Protein Sources [place], kidney, pinto, and soy beans. Split peas. Lentils. Nuts and seeds. Dairy Fiber-fortified yogurt. Beverages Fiber-fortified soy milk. Fiber-fortified orange juice. Other Fiber bars. The items listed above may not be a complete list of recommended foods or beverages. Contact your dietitian for more options. What foods are not recommended? Grains White bread. Pasta made with refined flour. White rice. Vegetables Fried potatoes. Canned vegetables. Well-cooked vegetables. Fruits Fruit juice. Cooked, strained fruit. Meats and Other Protein Sources Fatty cuts of meat. Fried Sales executive or fried fish. Dairy Milk. Yogurt. Cream cheese. Sour cream. Beverages Soft drinks. Other Cakes and pastries. Butter and oils. The items listed above may not be a complete list of foods and beverages to avoid. Contact your dietitian for more information. What are some tips for including high-fiber foods in my diet?  Eat a wide variety of high-fiber foods.  Make sure that half of all grains consumed each day are whole grains.  Replace breads and cereals made from refined flour or white flour with whole-grain breads and cereals.  Replace white rice with brown rice, bulgur wheat, or millet.  Start the day with a breakfast that is high in fiber, such as a cereal that contains at least 5 grams of fiber per serving.  Use beans in place of meat in soups, salads, or pasta.  Eat high-fiber snacks, such as berries, raw vegetables, nuts, or popcorn.     Hemorrhoids Hemorrhoids are swollen veins in and around the rectum or anus. There are two types of hemorrhoids:  Internal hemorrhoids. These occur in the veins that are just inside the rectum. They may poke through to the outside and become irritated and painful.  External hemorrhoids. These occur in the veins that  are outside of the anus and can be felt as a painful swelling or hard lump near the anus.  Most hemorrhoids do not cause serious problems, and they can be managed with home treatments such as diet and lifestyle changes. If home treatments do not help your symptoms, procedures can be done to shrink or remove the hemorrhoids. What are the causes? This condition is caused by increased pressure in the anal area. This pressure may result from various things, including:  Constipation.  Straining to have a bowel movement.  Diarrhea.  Pregnancy.  Obesity.  Sitting for long periods of time.  Heavy lifting or other activity that causes you to strain.  Anal sex.  What are the signs or symptoms? Symptoms of this condition include:  Pain.  Anal itching or irritation.  Rectal bleeding.  Leakage of stool (feces).  Anal swelling.  One or more lumps around the anus.  How is this diagnosed? This condition can often be diagnosed through a visual exam. Other exams or tests may also be done, such as:  Examination of the rectal area with a gloved hand (digital rectal exam).  Examination of the anal canal using a small tube (anoscope).  A blood test, if you have lost a significant amount of blood.  A test to look inside the colon (sigmoidoscopy or colonoscopy).  How is this treated? This condition can usually be treated at home. However, various procedures may be done if dietary changes, lifestyle changes, and other home treatments do not help your symptoms. These procedures can help make the hemorrhoids smaller or remove them completely. Some of these procedures involve surgery, and others do not. Common procedures include:  Rubber band ligation. Rubber bands are placed at the base of the hemorrhoids to cut off the blood supply to them.  Sclerotherapy. Medicine is injected into the hemorrhoids to shrink them.  Infrared coagulation. A type of light energy is used to get rid of the  hemorrhoids.  Hemorrhoidectomy surgery. The hemorrhoids are surgically removed, and the veins that supply them are tied off.  Stapled hemorrhoidopexy surgery. A circular stapling device is used to remove the hemorrhoids and use staples to cut off the blood supply to them.  Follow these instructions at home: Eating and drinking  Eat foods that have a lot of fiber in them, such as whole grains, beans, nuts, fruits, and vegetables. Ask your health care provider about taking products that have added fiber (fiber supplements).  Drink enough fluid to keep your urine clear or pale yellow. Managing pain and swelling  Take warm sitz baths for 20 minutes, 3-4 times a day to ease pain and discomfort.  If directed, apply ice to the affected area. Using ice packs between sitz baths may be helpful. ? Put ice in a plastic bag. ? Place a towel between your skin and the bag. ? Leave the ice on for 20 minutes, 2-3 times a day. General instructions  Take over-the-counter and prescription medicines only as told by your health care provider.  Use medicated creams or suppositories as told.  Exercise regularly.  Go to the bathroom when you have the urge to have a bowel movement. Do not wait.  Avoid straining to have bowel movements.  Keep the anal area dry and clean. Use wet toilet paper or moist towelettes after a bowel movement.  Do not sit on the toilet for long periods of time. This increases blood pooling and pain. Contact a health care provider if:  You have increasing pain and swelling that are not controlled by treatment or medicine.  You have uncontrolled bleeding.  You have difficulty having a bowel movement, or you are unable to have a bowel movement.  You have pain or inflammation outside the area of the hemorrhoids. This information is not intended to replace advice given to you by your health care provider. Make sure you discuss any questions you have with your health care  provider. Document Released: 12/18/1999 Document Revised: 05/20/2015 Document Reviewed: 09/03/2014 Elsevier Interactive Patient Education  [employer].

## 2017-01-19 NOTE — Progress Notes (Signed)
Dr. Laural Golden in to talk with patient's sister Dierdre Searles.  Warm compress applied to right forearm with mild erythema from infiltrated IV.  Patient and sister instructed to call Dr. Olevia Perches office with increase in pain to right arm.

## 2017-01-19 NOTE — H&P (Signed)
Duane Alexander is an 62 y.o. male.   Chief Complaint: The patient is here for colonoscopy. HPI: Patient is 62 year old Caucasian male who is here for screening colonoscopy.  He denies abdominal pain change in bowel habits or rectal bleeding. This is patient's first exam. Family history is negative for CRC.  Past Medical History:  Diagnosis Date  . Aortic atherosclerosis ([employer])   . COPD (chronic obstructive pulmonary disease) ([place])   . Family history of lung cancer   . Hypertension   . Smoker   . Wears glasses     Past Surgical History:  Procedure Laterality Date  . Right eye surgery     as a child-removed muscle from leg and put in upper eye lid    Family History  Problem Relation Age of Onset  . Cancer Mother        lung  . Other Father        died in his 25s of stomach issues?  . Cancer Brother 73       lung  . Other Sister        GI related death  . Heart disease Neg Hx   . Hypertension Neg Hx   . Stroke Neg Hx   . Colon cancer Neg Hx    Social History:  reports that he has been smoking cigarettes.  He has a 45.00 pack-year smoking history. he has never used smokeless tobacco. He reports that he drinks about 6.0 oz of alcohol per week. He reports that he does not use drugs.  Allergies: No Known Allergies  Medications Prior to Admission  Medication Sig Dispense Refill  . aspirin EC 81 MG tablet Take 1 tablet (81 mg total) by mouth daily. 90 tablet 3  . Multiple Vitamin (MULTIVITAMIN) tablet Take 1 tablet by mouth daily.    [name] albuterol (PROVENTIL HFA;VENTOLIN HFA) 108 (90 Base) MCG/ACT inhaler Inhale 2 puffs into the lungs every 6 (six) hours as needed for wheezing or shortness of breath. 1 Inhaler 1  . Fluticasone-Salmeterol (ADVAIR DISKUS) 250-50 MCG/DOSE AEPB Inhale 1 puff into the lungs 2 (two) times daily. (Patient not taking: Reported on 01/10/2017) 1 each 11  . lisinopril-hydrochlorothiazide (PRINZIDE,ZESTORETIC) 10-12.5 MG tablet take 1 tablet by mouth once  daily (Patient not taking: Reported on 01/10/2017) 90 tablet 1  . lovastatin (MEVACOR) 20 MG tablet take 1 tablet by mouth at bedtime (Patient not taking: Reported on 01/10/2017) 90 tablet 1  . naproxen (NAPROSYN) 500 MG tablet take 1 tablet by mouth twice a day with meals (Patient not taking: Reported on 01/10/2017) 30 tablet 1  . omeprazole (PRILOSEC) 40 MG capsule Take 1 capsule (40 mg total) by mouth daily. (Patient not taking: Reported on 01/10/2017) 30 capsule 2  . ondansetron (ZOFRAN) 4 MG tablet Take 1 tablet (4 mg total) by mouth every 8 (eight) hours as needed for nausea or vomiting. (Patient not taking: Reported on 01/10/2017) 20 tablet 0  . sildenafil (VIAGRA) 50 MG tablet Take 1 tablet (50 mg total) by mouth as needed for erectile dysfunction. (Patient not taking: Reported on 07/01/2016) 10 tablet 2    No results found for this or any previous visit (from the past 48 hour(s)). No results found.  ROS  Blood pressure (!) 170/96, pulse 74, temperature 98.2 F (36.8 C), temperature source Oral, resp. rate 12, height 6' (1.829 m), weight 160 lb (72.6 kg), SpO2 100 %. Physical Exam  Constitutional: He appears well-developed and well-nourished.  HENT:  Mouth/Throat: Oropharynx is  clear and moist.  Eyes: Conjunctivae are normal. No scleral icterus.  Neck: No thyromegaly present.  Cardiovascular: Normal rate, regular rhythm and normal heart sounds.  No murmur heard. Respiratory: Effort normal and breath sounds normal.  GI:  Abdomen is flat and soft without tenderness organomegaly or masses.  Musculoskeletal: He exhibits no edema.  Lymphadenopathy:    He has no cervical adenopathy.  Neurological: He is alert.  Skin: Skin is warm and dry.     Assessment/Plan Average risk screening colonoscopy.  Hildred Laser, MD 01/19/2017, 9:40 AM

## 2017-01-19 NOTE — Progress Notes (Signed)
Mild erythema and swelling noted to Right upper arm status post removal of IV infiltration.  Patient and sister verbalized understanding to call Dr. Olevia Perches office with increased redness, swelling or pain.

## 2017-01-23 ENCOUNTER — Encounter (HOSPITAL_COMMUNITY): Payer: Self-pay | Admitting: Internal Medicine

## 2017-07-17 ENCOUNTER — Ambulatory Visit: Payer: BLUE CROSS/BLUE SHIELD | Admitting: Medical

## 2017-07-17 VITALS — BP 150/100 | HR 68 | Temp 98.0°F | Resp 16 | Ht 69.0 in | Wt 162.0 lb

## 2017-07-17 DIAGNOSIS — E785 Hyperlipidemia, unspecified: Secondary | ICD-10-CM

## 2017-07-17 DIAGNOSIS — J449 Chronic obstructive pulmonary disease, unspecified: Secondary | ICD-10-CM

## 2017-07-17 DIAGNOSIS — F17200 Nicotine dependence, unspecified, uncomplicated: Secondary | ICD-10-CM

## 2017-07-17 DIAGNOSIS — IMO0001 Reserved for inherently not codable concepts without codable children: Secondary | ICD-10-CM

## 2017-07-17 DIAGNOSIS — R945 Abnormal results of liver function studies: Secondary | ICD-10-CM | POA: Diagnosis not present

## 2017-07-17 DIAGNOSIS — I10 Essential (primary) hypertension: Secondary | ICD-10-CM

## 2017-07-17 DIAGNOSIS — R7989 Other specified abnormal findings of blood chemistry: Secondary | ICD-10-CM

## 2017-07-17 NOTE — Progress Notes (Signed)
Subjective:. Chief Complaint  Patient presents with  . med check    med check out of meds    Here for med check.  Last visit over one year ago, ran out of medications.  Prior to running out of medications he was compliant with lisinopril HCT 10/12.5 mg daily.  He is also compliant with lovastatin 20 mg and aspirin daily.  He does manual labor for work, hot sun all day long, drinks plenty water.  However he has had 3 recent episodes of syncope.  Each time he had got up from seated position it was sudden without warning he woke up when he realized he had passed out.  At least 1 of these events was witnessed.  There was no reported seizure, no reported sweaty or clammy feeling prior to the event.  He denies chest pain, swelling, shortness of breath, paresthesias, weakness, vision changes.  He does get some ringing in the ears.  Past Medical History:  Diagnosis Date  . Aortic atherosclerosis ([place])   . COPD (chronic obstructive pulmonary disease) ([place])   . Family history of lung cancer   . Hypertension   . Smoker   . Wears glasses    Current Outpatient Medications on File Prior to Visit  Medication Sig Dispense Refill  . Multiple Vitamin (MULTIVITAMIN) tablet Take 1 tablet by mouth daily.    [name] albuterol (PROVENTIL HFA;VENTOLIN HFA) 108 (90 Base) MCG/ACT inhaler Inhale 2 puffs into the lungs every 6 (six) hours as needed for wheezing or shortness of breath. (Patient not taking: Reported on 07/17/2017) 1 Inhaler 1  . aspirin EC 81 MG tablet Take 1 tablet (81 mg total) by mouth daily. (Patient not taking: Reported on 07/17/2017) 90 tablet 3  . lisinopril-hydrochlorothiazide (PRINZIDE,ZESTORETIC) 10-12.5 MG tablet take 1 tablet by mouth once daily (Patient not taking: Reported on 01/10/2017) 90 tablet 1  . lovastatin (MEVACOR) 20 MG tablet take 1 tablet by mouth at bedtime (Patient not taking: Reported on 01/10/2017) 90 tablet 1   No current facility-administered medications on file prior to visit.     ROS as in subjective   Objective: BP (!) 150/100   Pulse 68   Temp 98 F (36.7 C) (Oral)   Resp 16   Ht 5\' 9"  (1.753 m)   Wt 162 lb (73.5 kg)   SpO2 99%   BMI 23.92 kg/m     General appearance: alert, no distress, WD/WN,  HENT unremarkable Neck: supple, no lymphadenopathy, no thyromegaly, no masses, no bruits Heart: RRR, normal S1, S2, no murmurs Lungs: CTA bilaterally, no wheezes, rhonchi, or rales Extremities: no edema, no cyanosis, no clubbing Pulses: 2+ symmetric, upper and lower extremities, normal cap refill Neurological: alert, oriented x 3, CN2-12 intact, strength normal upper extremities and lower extremities, sensation normal throughout, DTRs 2+ throughout, no cerebellar signs, gait normal Psychiatric: normal affect, behavior normal, pleasant    Adult ECG Report  Indication: syncope  Rate: 62 bpm  Rhythm: normal sinus rhythm  QRS Axis: 65 degrees  PR Interval: 169ms  QRS Duration: 41ms  QTc: 4110ms  Conduction Disturbances: possible LVH  Other Abnormalities: none  Patient's cardiac risk factors are: dyslipidemia, hypertension, male gender and smoking/ tobacco exposure.  EKG comparison: none  Narrative Interpretation: possible LVH     Assessment: Encounter Diagnoses  Name Primary?  . Hypertension, unspecified type Yes  . Hyperlipidemia, unspecified hyperlipidemia type   . Chronic obstructive pulmonary disease, unspecified COPD type ([place])   . Elevated LFTs   .  Smoking      Plan Syncope- reviewed EKG, discussed differential, follow-up pending labs  Hypertension- pending labs we will stop the hydrochlorothiazide given the risk working out in the hot sun and already having syncopal episodes.  Will likely continue lisinopril and add something else.  Hyperlipidemia-restart statin and aspirin  Counseled on tobacco cessation and recommend he quit  Last year had elevated LFTs- labs today  Duane Alexander was seen today for med check.  Diagnoses and all  orders for this visit:  Hypertension, unspecified type -     Comprehensive metabolic panel -     CBC -     EKG 12-Lead  Hyperlipidemia, unspecified hyperlipidemia type -     Comprehensive metabolic panel  Chronic obstructive pulmonary disease, unspecified COPD type ([facility])  Elevated LFTs -     Comprehensive metabolic panel  Smoking  f/u pending labs

## 2017-07-18 LAB — CBC
HEMOGLOBIN: 15.4 g/dL (ref 13.0–17.7)
Hematocrit: 44.4 % (ref 37.5–51.0)
MCH: 35 pg — ABNORMAL HIGH (ref 26.6–33.0)
MCHC: 34.7 g/dL (ref 31.5–35.7)
MCV: 101 fL — ABNORMAL HIGH (ref 79–97)
Platelets: 244 10*3/uL (ref 150–450)
RBC: 4.4 x10E6/uL (ref 4.14–5.80)
RDW: 13 % (ref 12.3–15.4)
WBC: 6.8 10*3/uL (ref 3.4–10.8)

## 2017-07-18 LAB — COMPREHENSIVE METABOLIC PANEL
A/G RATIO: 1.3 (ref 1.2–2.2)
ALK PHOS: 59 IU/L (ref 39–117)
ALT: 31 IU/L (ref 0–44)
AST: 51 IU/L — AB (ref 0–40)
Albumin: 4.7 g/dL (ref 3.6–4.8)
BUN/Creatinine Ratio: 21 (ref 10–24)
BUN: 16 mg/dL (ref 8–27)
Bilirubin Total: 0.6 mg/dL (ref 0.0–1.2)
CALCIUM: 9.9 mg/dL (ref 8.6–10.2)
CO2: 26 mmol/L (ref 20–29)
Chloride: 97 mmol/L (ref 96–106)
Creatinine, Ser: 0.78 mg/dL (ref 0.76–1.27)
GFR calc Af Amer: 110 mL/min/{1.73_m2} (ref >59)
GFR, EST NON AFRICAN AMERICAN: 95 mL/min/{1.73_m2} (ref >59)
Globulin, Total: 3.6 g/dL (ref 1.5–4.5)
Glucose: 75 mg/dL (ref 65–99)
POTASSIUM: 4.4 mmol/L (ref 3.5–5.2)
Sodium: 139 mmol/L (ref 134–144)
Total Protein: 8.3 g/dL (ref 6.0–8.5)

## 2017-07-19 ENCOUNTER — Other Ambulatory Visit: Payer: Self-pay | Admitting: Medical

## 2017-07-19 MED ORDER — ALBUTEROL SULFATE HFA 108 (90 BASE) MCG/ACT IN AERS: 2.0000 | INHALATION_SPRAY | Freq: Four times a day (QID) | RESPIRATORY_TRACT | 1 refills | Status: DC | PRN

## 2017-07-19 MED ORDER — LISINOPRIL 20 MG PO TABS: 20.0000 mg | ORAL_TABLET | Freq: Every day | ORAL | 3 refills | Status: DC

## 2017-07-19 MED ORDER — ASPIRIN EC 81 MG PO TBEC: 81.0000 mg | DELAYED_RELEASE_TABLET | Freq: Every day | ORAL | 3 refills | Status: DC

## 2017-07-19 MED ORDER — LOVASTATIN 20 MG PO TABS: 20.0000 mg | ORAL_TABLET | Freq: Every day | ORAL | 3 refills | Status: DC

## 2017-07-21 ENCOUNTER — Other Ambulatory Visit: Payer: Self-pay

## 2017-07-21 DIAGNOSIS — I700 Atherosclerosis of aorta: Secondary | ICD-10-CM

## 2017-07-21 DIAGNOSIS — R55 Syncope and collapse: Secondary | ICD-10-CM

## 2017-07-21 DIAGNOSIS — I10 Essential (primary) hypertension: Secondary | ICD-10-CM

## 2017-08-08 ENCOUNTER — Telehealth: Payer: Self-pay

## 2017-08-08 NOTE — Telephone Encounter (Signed)
Appointment information

## 2017-08-08 NOTE — Telephone Encounter (Signed)
Left message on voicemail for patient to call back for appointment information.    Patient has appointment  Oct 09, 2017 At 10 am be there by 945.  With Dr Daneen Schick  [facility].

## 2017-09-01 ENCOUNTER — Ambulatory Visit
Admission: RE | Admit: 2017-09-01 | Discharge: 2017-09-01 | Disposition: A | Discharge status: Home / Self Care | Payer: BLUE CROSS/BLUE SHIELD | Source: Ambulatory Visit | Attending: Medical | Admitting: Medical

## 2017-09-01 ENCOUNTER — Ambulatory Visit: Payer: BLUE CROSS/BLUE SHIELD | Admitting: Medical

## 2017-09-01 ENCOUNTER — Telehealth: Payer: Self-pay

## 2017-09-01 VITALS — BP 140/80 | HR 76 | Temp 98.1°F | Resp 16 | Ht 69.0 in | Wt 159.2 lb

## 2017-09-01 DIAGNOSIS — M6281 Muscle weakness (generalized): Secondary | ICD-10-CM | POA: Diagnosis not present

## 2017-09-01 DIAGNOSIS — R55 Syncope and collapse: Secondary | ICD-10-CM

## 2017-09-01 DIAGNOSIS — R29898 Other symptoms and signs involving the musculoskeletal system: Secondary | ICD-10-CM

## 2017-09-01 DIAGNOSIS — I700 Atherosclerosis of aorta: Secondary | ICD-10-CM

## 2017-09-01 DIAGNOSIS — I10 Essential (primary) hypertension: Secondary | ICD-10-CM

## 2017-09-01 DIAGNOSIS — IMO0001 Reserved for inherently not codable concepts without codable children: Secondary | ICD-10-CM

## 2017-09-01 DIAGNOSIS — R200 Anesthesia of skin: Secondary | ICD-10-CM | POA: Insufficient documentation

## 2017-09-01 DIAGNOSIS — M5136 Other intervertebral disc degeneration, lumbar region: Secondary | ICD-10-CM

## 2017-09-01 DIAGNOSIS — R0989 Other specified symptoms and signs involving the circulatory and respiratory systems: Secondary | ICD-10-CM

## 2017-09-01 DIAGNOSIS — F17200 Nicotine dependence, unspecified, uncomplicated: Secondary | ICD-10-CM

## 2017-09-01 DIAGNOSIS — M25551 Pain in right hip: Secondary | ICD-10-CM | POA: Insufficient documentation

## 2017-09-01 DIAGNOSIS — E785 Hyperlipidemia, unspecified: Secondary | ICD-10-CM | POA: Diagnosis not present

## 2017-09-01 DIAGNOSIS — R945 Abnormal results of liver function studies: Secondary | ICD-10-CM | POA: Diagnosis not present

## 2017-09-01 DIAGNOSIS — M51369 Other intervertebral disc degeneration, lumbar region without mention of lumbar back pain or lower extremity pain: Secondary | ICD-10-CM | POA: Insufficient documentation

## 2017-09-01 DIAGNOSIS — M47816 Spondylosis without myelopathy or radiculopathy, lumbar region: Secondary | ICD-10-CM | POA: Diagnosis not present

## 2017-09-01 DIAGNOSIS — R7989 Other specified abnormal findings of blood chemistry: Secondary | ICD-10-CM

## 2017-09-01 NOTE — Progress Notes (Signed)
Subjective: Chief Complaint  Patient presents with  . pain    pain in hip right, numbness all over, legs gives away X 2 months   Here for several concerns.  I saw him a little over a month ago for med check as well as concerns about syncope.  Last visit we stopped his hydrochlorothiazide but continue lisinopril.  He works outside in the heat, sweats throughout the day, drinks a lot of water throughout the day but loses a lot of water as well.  Since last visit he has not had any additional syncope episode, feels better on the lisinopril in general.  He has several other concerns today though.  In the past few months he has had numbness in his hands from his upper arm down to his hands bilaterally.  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  He does drink alcohol, smokes.  Denies neck pain no decreased neck range of motion, no upper back pain  He reports history of low back pain, last MRI may be 10 years ago, prior epidural steroid injections.  He has not been having back pain as much lately but he complains of right hip pain and feels like his leg gives way, feels weak in his right leg.  He has had several falls about this lately.  Recently his dog was pulling him and he ended up falling into a ditch.  He was having the leg give way before this  Last visit his liver tests are elevated.  Here to follow-up on this with additional blood work  In general feels like his muscles are weak at times  Denies vision change, hearing change, headache, no snoring no witnessed apnea no concern for sleep apnea.  He is compliant with lovastatin 20 mg and aspirin daily started back last visit as he has been out of this for a while  Past Medical History:  Diagnosis Date  . Aortic atherosclerosis ([place])   . COPD (chronic obstructive pulmonary disease) ([place])   . Family history of lung cancer   . Hypertension   . Smoker   . Wears glasses    Current Outpatient Medications on File Prior to  Visit  Medication Sig Dispense Refill  . albuterol (PROVENTIL HFA;VENTOLIN HFA) 108 (90 Base) MCG/ACT inhaler Inhale 2 puffs into the lungs every 6 (six) hours as needed for wheezing or shortness of breath. 1 Inhaler 1  . aspirin EC 81 MG tablet Take 1 tablet (81 mg total) by mouth daily. 90 tablet 3  . lisinopril (PRINIVIL,ZESTRIL) 20 MG tablet Take 1 tablet (20 mg total) by mouth daily. 90 tablet 3  . lovastatin (MEVACOR) 20 MG tablet Take 1 tablet (20 mg total) by mouth at bedtime. 90 tablet 3  . Multiple Vitamin (MULTIVITAMIN) tablet Take 1 tablet by mouth daily.     No current facility-administered medications on file prior to visit.    ROS as in subjective    Objective: BP 140/80   Pulse 76   Temp 98.1 F (36.7 C) (Oral)   Resp 16   Ht 5\' 9"  (1.753 m)   Wt 159 lb 3.2 oz (72.2 kg)   SpO2 95%   BMI 23.51 kg/m   Wt Readings from Last 3 Encounters:  09/01/17 159 lb 3.2 oz (72.2 kg)  07/17/17 162 lb (73.5 kg)  01/19/17 160 lb (72.6 kg)   General appearance: alert, no distress, WD/WN HEENT: normocephalic, sclerae anicteric, PERRLA, EOMi, nares patent, no discharge or erythema, pharynx normal  Oral cavity: MMM Neck: supple, no lymphadenopathy, no thyromegaly, no masses, no bruits Heart: RRR, normal S1, S2, no murmurs Lungs: CTA bilaterally, no wheezes, rhonchi, or rales Abdomen: +bs, soft, non tender, non distended, no masses, no hepatomegaly, no splenomegaly, no bruits Back: mild tenderness over right sciatic notch, otherwise non tender, mildly decrease ROM.  He is somewhat cautious getting down from exam tablet Musculoskeletal: mild tenderness right lateral hip, but normal hip ROM, otherwise nontender, no swelling, no obvious deformity Extremities: no edema, no cyanosis, no clubbing Pulses: 2+ symmetric, upper and 1+ lower extremities, normal cap refill.   right LE seems decreased worse than left pedal pulses Neurological: alert, oriented x 3, CN2-12 intact,  DTRs 3+  throughout increased, otherwise strength normal upper extremities and lower extremities, sensation normal throughout, DTRs 2+ throughout, no cerebellar signs, gait normal Psychiatric: normal affect, behavior normal, pleasant     Assessment: Encounter Diagnoses  Name Primary?  . Arm numbness Yes  . Muscle weakness   . Right hip pain   . DDD (degenerative disc disease), lumbar   . Right leg weakness   . Syncope, unspecified syncope type   . Elevated LFTs   . Aortic atherosclerosis ([place])   . Hypertension, unspecified type   . Hyperlipidemia, unspecified hyperlipidemia type   . Smoking   . Decreased pulse     Plan: We discussed his symptoms and concerns, possible etiologies.  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  Syncope, smoker, hypertension, decreased pulses- continue plan to follow-up with cardiology soon for consult.  We will work to get his appointment moved up sooner than October  He continues to smoke despite encouragement to quit  Decreased pulses- depending on cardiology consult, may ultimately get ABIs but he denies claudication symptoms  Hyperlipidemia-continue statin and aspirin   Duane Alexander was seen today for pain.  Diagnoses and all orders for this visit:  Arm numbness -     Vitamin B12 -     Magnesium -     TSH -     Iron and TIBC -     CK  Muscle weakness -     Vitamin B12 -     Magnesium -     TSH -     Iron and TIBC -     CK -     DG HIP  UNILAT WITH PELVIS 2-3 VIEWS RIGHT; Future -     DG Lumbar Spine Complete; Future  Right hip pain -     DG HIP UNILAT WITH PELVIS 2-3 VIEWS RIGHT; Future -     DG Lumbar Spine Complete; Future  DDD (degenerative disc disease), lumbar -     DG HIP UNILAT WITH PELVIS 2-3 VIEWS RIGHT; Future -     DG Lumbar Spine Complete; Future  Right leg weakness -     Vitamin B12 -     Magnesium -     TSH -     Iron and TIBC -  CK -     DG HIP UNILAT WITH PELVIS 2-3 VIEWS RIGHT; Future -     DG Lumbar Spine Complete; Future  Syncope, unspecified syncope type  Elevated LFTs -     Hepatitis C antibody -     Hepatitis B surface antigen -     Hepatic Function Panel -     AFP tumor marker -     Iron and TIBC -     CK  Aortic atherosclerosis (HCC)  Hypertension, unspecified type  Hyperlipidemia, unspecified hyperlipidemia type -     Lipid panel  Smoking  Decreased pulse

## 2017-09-01 NOTE — Telephone Encounter (Signed)
Faxed referral to Dr Thurman Coyer office to see if they can get him in earlier than October per [facility].

## 2017-09-02 LAB — IRON AND TIBC
IRON SATURATION: 28 % (ref 15–55)
IRON: 97 ug/dL (ref 38–169)
Total Iron Binding Capacity: 342 ug/dL (ref 250–450)
UIBC: 245 ug/dL (ref 111–343)

## 2017-09-02 LAB — AFP TUMOR MARKER: AFP, Serum, Tumor Marker: 3.9 ng/mL (ref 0.0–8.3)

## 2017-09-02 LAB — LIPID PANEL
CHOL/HDL RATIO: 2 ratio (ref 0.0–5.0)
Cholesterol, Total: 196 mg/dL (ref 100–199)
HDL: 98 mg/dL (ref >39)
LDL Calculated: 86 mg/dL (ref 0–99)
Triglycerides: 62 mg/dL (ref 0–149)
VLDL Cholesterol Cal: 12 mg/dL (ref 5–40)

## 2017-09-02 LAB — TSH: TSH: 2.64 u[IU]/mL (ref 0.450–4.500)

## 2017-09-02 LAB — CK: Total CK: 110 U/L (ref 24–204)

## 2017-09-02 LAB — HEPATITIS B SURFACE ANTIGEN: HEP B S AG: NEGATIVE

## 2017-09-02 LAB — HEPATIC FUNCTION PANEL
ALBUMIN: 4.5 g/dL (ref 3.6–4.8)
ALT: 41 IU/L (ref 0–44)
AST: 58 IU/L — AB (ref 0–40)
Alkaline Phosphatase: 57 IU/L (ref 39–117)
Bilirubin Total: 0.8 mg/dL (ref 0.0–1.2)
Bilirubin, Direct: 0.25 mg/dL (ref 0.00–0.40)
TOTAL PROTEIN: 8.1 g/dL (ref 6.0–8.5)

## 2017-09-02 LAB — HEPATITIS C ANTIBODY

## 2017-09-02 LAB — MAGNESIUM: MAGNESIUM: 2.2 mg/dL (ref 1.6–2.3)

## 2017-09-02 LAB — VITAMIN B12: VITAMIN B 12: 460 pg/mL (ref 232–1245)

## 2017-09-03 DIAGNOSIS — Y92009 Unspecified place in unspecified non-institutional (private) residence as the place of occurrence of the external cause: Secondary | ICD-10-CM | POA: Diagnosis not present

## 2017-09-03 DIAGNOSIS — L309 Dermatitis, unspecified: Secondary | ICD-10-CM | POA: Diagnosis not present

## 2017-09-03 DIAGNOSIS — W19XXXA Unspecified fall, initial encounter: Secondary | ICD-10-CM | POA: Diagnosis not present

## 2017-09-03 DIAGNOSIS — F17200 Nicotine dependence, unspecified, uncomplicated: Secondary | ICD-10-CM | POA: Diagnosis not present

## 2017-09-03 DIAGNOSIS — S0181XA Laceration without foreign body of other part of head, initial encounter: Secondary | ICD-10-CM | POA: Diagnosis not present

## 2017-09-03 DIAGNOSIS — J069 Acute upper respiratory infection, unspecified: Secondary | ICD-10-CM | POA: Diagnosis not present

## 2017-09-05 ENCOUNTER — Other Ambulatory Visit: Payer: Self-pay | Admitting: Medical

## 2017-09-05 ENCOUNTER — Other Ambulatory Visit: Payer: Self-pay

## 2017-09-05 ENCOUNTER — Encounter: Payer: Self-pay | Admitting: Cardiovascular Disease

## 2017-09-05 ENCOUNTER — Ambulatory Visit: Payer: BLUE CROSS/BLUE SHIELD | Admitting: Cardiovascular Disease

## 2017-09-05 VITALS — BP 132/74 | HR 72 | Ht 72.0 in | Wt 161.0 lb

## 2017-09-05 DIAGNOSIS — I10 Essential (primary) hypertension: Secondary | ICD-10-CM

## 2017-09-05 DIAGNOSIS — E7800 Pure hypercholesterolemia, unspecified: Secondary | ICD-10-CM | POA: Diagnosis not present

## 2017-09-05 DIAGNOSIS — R945 Abnormal results of liver function studies: Principal | ICD-10-CM

## 2017-09-05 DIAGNOSIS — R55 Syncope and collapse: Secondary | ICD-10-CM

## 2017-09-05 DIAGNOSIS — F17200 Nicotine dependence, unspecified, uncomplicated: Secondary | ICD-10-CM

## 2017-09-05 DIAGNOSIS — R0989 Other specified symptoms and signs involving the circulatory and respiratory systems: Secondary | ICD-10-CM

## 2017-09-05 DIAGNOSIS — I700 Atherosclerosis of aorta: Secondary | ICD-10-CM | POA: Diagnosis not present

## 2017-09-05 DIAGNOSIS — I2510 Atherosclerotic heart disease of native coronary artery without angina pectoris: Secondary | ICD-10-CM | POA: Diagnosis not present

## 2017-09-05 DIAGNOSIS — K759 Inflammatory liver disease, unspecified: Secondary | ICD-10-CM

## 2017-09-05 DIAGNOSIS — R7989 Other specified abnormal findings of blood chemistry: Secondary | ICD-10-CM

## 2017-09-05 MED ORDER — GABAPENTIN 300 MG PO CAPS: 300.0000 mg | ORAL_CAPSULE | Freq: Every day | ORAL | 2 refills | Status: DC

## 2017-09-05 NOTE — Patient Instructions (Signed)
Medication Instructions: Dr Sallyanne Kuster recommends that you continue on your current medications as directed. Please refer to the Current Medication list given to you today.  Labwork: NONE ORDERED  Testing/Procedures: 1. Lower Extremity Arterial Dopplers/ABIs - Your physician has requested that you have a lower extremity arterial duplex. This test is an ultrasound of the arteries in the legs. It looks at arterial blood flow in the legs. Allow one hour for Lower Arterial scans. There are no restrictions or special instructions. Your physician has requested that you have an ankle brachial index (ABI). During this test an ultrasound and blood pressure cuff are used to evaluate the arteries that supply the arms and legs with blood. Allow thirty minutes for this exam. There are no restrictions or special instructions.  2. Aorta Duplex - Your physician has requested that you have an abdominal aorta duplex. During this test, an ultrasound is used to evaluate the aorta. Allow 30 minutes for this exam. Do not eat after midnight the day before and avoid carbonated beverages.  3. Exercise Tolerance Test - Your physician has requested that you have an exercise tolerance test. For further information please visit [email or web address]. Please also follow instruction sheet, as given.  Follow-up: Dr Sallyanne Kuster recommends that you follow-up with him as needed.  If you need a refill on your cardiac medications before your next appointment, please call your pharmacy.

## 2017-09-05 NOTE — Progress Notes (Signed)
Cardiology Office Note:    Date:  09/05/2017   ID:  Duane Alexander, DOB 03-05-1955, MRN [id number]  PCP:  Carlena Hurl, PA-C  Cardiologist:  No primary care provider on file.   Referring MD: Carlena Hurl, PA-C   Chief Complaint  Patient presents with  . Follow-up    Both siders numb and right hip gives out.   Duane Alexander is a 62 y.o. male who is being seen today for the evaluation of syncope and aortic atherosclerosis at the request of [name].   History of Present Illness:    Duane Alexander is a 62 y.o. male smoker with a hx of HTN, hypercholesterolemia, COPD,low back pain, aortic atherosclerosis and "minimal coronary atherosclerosis" incidentally noted on chest CT.  The patient specifically denies any chest pain at rest or with exertion, dyspnea at rest or with exertion, orthopnea, paroxysmal nocturnal dyspnea, syncope, palpitations, focal neurological deficits, intermittent claudication, lower extremity edema, unexplained weight gain, cough, hemoptysis or wheezing.  He describes numbness in his fingertips bilaterally.  This occurring in a symmetrical fashion.  He mentions of pain from frostbite as a child in [place].  He complains of recurrent discomfort and instability in his right hip which is caused a couple of falls.  He just had x-rays done of his lower back and hip. There is minor L4-L5 disc height loss and there are no abnormalities on the hip x-ray.  Abdominal aortic atherosclerosis is incidentally noted.  About 6 months ago he had a single syncopal event.  It happened immediately upon standing up from a couch and he fell on the coffee table.  At that time he was also taking a diuretic for hypertension.  No events have occurred before or since.  He denies dizziness or lightheadedness recently.  Past Medical History:  Diagnosis Date  . Aortic atherosclerosis ([place])   . COPD (chronic obstructive pulmonary disease) ([place])   . Family history  of lung cancer   . Hypertension   . Smoker   . Wears glasses     Past Surgical History:  Procedure Laterality Date  . COLONOSCOPY N/A 01/19/2017   Procedure: COLONOSCOPY;  Surgeon: Rogene Houston, MD;  [facility];  Service: Endoscopy;  Laterality: N/A;  930-moved to 9:45 per Lelon Frohlich  . POLYPECTOMY  01/19/2017   Procedure: POLYPECTOMY;  Surgeon: Rogene Houston, MD;  [facility];  Service: Endoscopy;;  splenic flexure  . Right eye surgery     as a child-removed muscle from leg and put in upper eye lid    Current Medications: Current Meds  Medication Sig  . aspirin EC 81 MG tablet Take 1 tablet (81 mg total) by mouth daily.  [name] lisinopril (PRINIVIL,ZESTRIL) 20 MG tablet Take 1 tablet (20 mg total) by mouth daily.  [name] lovastatin (MEVACOR) 20 MG tablet Take 1 tablet (20 mg total) by mouth at bedtime.  . Multiple Vitamin (MULTIVITAMIN) tablet Take 1 tablet by mouth daily.  . [DISCONTINUED] albuterol (PROVENTIL HFA;VENTOLIN HFA) 108 (90 Base) MCG/ACT inhaler Inhale 2 puffs into the lungs every 6 (six) hours as needed for wheezing or shortness of breath.     Allergies:   Patient has no known allergies.   Social History   Socioeconomic History  . Marital status: Widowed    Spouse name: Not on file  . Number of children: Not on file  . Years of education: Not on file  . Highest education level: Not on file  Occupational History  . Not on file  Social Needs  . Financial resource strain: Not on file  . Food insecurity:    Worry: Not on file    Inability: Not on file  . Transportation needs:    Medical: Not on file    Non-medical: Not on file  Tobacco Use  . Smoking status: Current Every Day Smoker    Packs/day: 1.00    Years: 45.00    Pack years: 45.00    Types: Cigarettes  . Smokeless tobacco: Never Used  Substance and Sexual Activity  . Alcohol use: Yes    Alcohol/week: 10.0 standard drinks    Types: 10 Cans of beer per week  . Drug use: No  .  Sexual activity: Yes    Birth control/protection: None  Lifestyle  . Physical activity:    Days per week: Not on file    Minutes per session: Not on file  . Stress: Not on file  Relationships  . Social connections:    Talks on phone: Not on file    Gets together: Not on file    Attends religious service: Not on file    Active member of club or organization: Not on file    Attends meetings of clubs or organizations: Not on file    Relationship status: Not on file  Other Topics Concern  . Not on file  Social History Narrative   Has girlfriend.  05/2016     Family History: The patient's family history includes Cancer in his mother; Cancer (age of onset: 17) in his brother; Other in his father and sister. There is no history of Heart disease, Hypertension, Stroke, or Colon cancer.  ROS:   Please see the history of present illness.     All other systems reviewed and are negative.  EKGs/Labs/Other Studies Reviewed:    The following studies were reviewed today: Notes from PCP, imaging studies  EKG:  EKG is not ordered today.  The ekg ordered July 17, 2017 demonstrates normal sinus rhythm, voltage is high because of his lean body habitus  Recent Labs: 07/17/2017: BUN 16; Creatinine, Ser 0.78; Hemoglobin 15.4; Platelets 244; Potassium 4.4; Sodium 139 09/01/2017: ALT 41; Magnesium 2.2; TSH 2.640  Recent Lipid Panel    Component Value Date/Time   CHOL 196 09/01/2017 1234   TRIG 62 09/01/2017 1234   HDL 98 09/01/2017 1234   CHOLHDL 2.0 09/01/2017 1234   CHOLHDL 2.7 05/17/2016 0712   VLDL 12 05/17/2016 0712   LDLCALC 86 09/01/2017 1234    Physical Exam:    VS:  BP 132/74 (BP Location: Left Arm, Patient Position: Sitting, Cuff Size: Normal)   Pulse 72   Ht 6' (1.829 m)   Wt 161 lb (73 kg)   BMI 21.84 kg/m     Wt Readings from Last 3 Encounters:  09/05/17 161 lb (73 kg)  09/01/17 159 lb 3.2 oz (72.2 kg)  07/17/17 162 lb (73.5 kg)     GEN: Smells of tobacco, well  nourished, well developed in no acute distress HEENT: Normal NECK: No JVD; No carotid bruits LYMPHATICS: No lymphadenopathy CARDIAC: RRR, no murmurs, rubs, gallops.  There are no carotid or subclavian bruits.  Femorals are 2+ without bruits.  Popliteals are 2+ bilaterally.  The left posterior tibial and right dorsalis pedis are 2+.  The left dorsalis pedis and right posterior tibial pulses are 1+.  Radials are 2+ bilaterally. RESPIRATORY:  Clear to auscultation without rales, wheezing or rhonchi  ABDOMEN: Soft, non-tender, non-distended.  Pulsatile abdominal aorta noted, but he is quite lean.  No abdominal bruits heard. MUSCULOSKELETAL:  No edema; No deformity  SKIN: Warm and dry NEUROLOGIC:  Alert and oriented x 3 PSYCHIATRIC:  Normal affect   ASSESSMENT:    1. Aortic atherosclerosis ([place])   2. Hypercholesterolemia   3. Coronary artery calcification seen on CT scan   4. Palpable abdominal aorta   5. Abnormal peripheral pulse   6. Smoker   7. Essential hypertension   8. Vasovagal syncope    PLAN:    In order of problems listed above:  1. Ao atherosclerosis: No history of embolic events, asymptomatic.   2. HLP: He is on a statin with an acceptable LDL level unless he is identified to have symptomatic or significant vascular disease involving the coronaries, carotids or peripheral arteries.  If we do find evidence of significant vascular problems on her screening tests, would increase a dose of statin to achieve an LDL less than 70.  Plan a treadmill stress test, ABIs, abdominal ultrasound for AAA. 3. Coronary atherosclerosis on CT: check a treadmill stress. 4. Pulsatile aorta: could just be because of his lean body habitus, check ultrasound since he has been a heavy smoker 5. Abnormal pulses: Check ABI; if abnormal will do a full lower extremity arterial Doppler study. 6. Smoking: Advised complete smoking cessation.  He successfully quit before not sure he can do it again with  appropriate support and behavioral changes. 7. HTN: Well-controlled on lisinopril therapy 8. Syncope: Circumstances were strongly suggestive of orthostatic hypotension.  No recurrent events in the last 6 months.  No plan for additional evaluation unless the events recur.   Medication Adjustments/Labs and Tests Ordered: Current medicines are reviewed at length with the patient today.  Concerns regarding medicines are outlined above.  Orders Placed This Encounter  Procedures  . EXERCISE TOLERANCE TEST (ETT)   No orders of the defined types were placed in this encounter.   Patient Instructions  Medication Instructions: Dr Sallyanne Kuster recommends that you continue on your current medications as directed. Please refer to the Current Medication list given to you today.  Labwork: NONE ORDERED  Testing/Procedures: 1. Lower Extremity Arterial Dopplers/ABIs - Your physician has requested that you have a lower extremity arterial duplex. This test is an ultrasound of the arteries in the legs. It looks at arterial blood flow in the legs. Allow one hour for Lower Arterial scans. There are no restrictions or special instructions. Your physician has requested that you have an ankle brachial index (ABI). During this test an ultrasound and blood pressure cuff are used to evaluate the arteries that supply the arms and legs with blood. Allow thirty minutes for this exam. There are no restrictions or special instructions.  2. Aorta Duplex - Your physician has requested that you have an abdominal aorta duplex. During this test, an ultrasound is used to evaluate the aorta. Allow 30 minutes for this exam. Do not eat after midnight the day before and avoid carbonated beverages.  3. Exercise Tolerance Test - Your physician has requested that you have an exercise tolerance test. For further information please visit [email or web address]. Please also follow instruction sheet, as given.  Follow-up: Dr Sallyanne Kuster recommends  that you follow-up with him as needed.  If you need a refill on your cardiac medications before your next appointment, please call your pharmacy.    Signed, Sanda Klein, MD  09/05/2017 8:58 AM    [facility]

## 2017-09-06 ENCOUNTER — Other Ambulatory Visit: Payer: BLUE CROSS/BLUE SHIELD

## 2017-09-06 ENCOUNTER — Ambulatory Visit: Payer: BLUE CROSS/BLUE SHIELD | Admitting: Cardiovascular Disease

## 2017-09-08 ENCOUNTER — Other Ambulatory Visit: Payer: Self-pay

## 2017-09-08 ENCOUNTER — Telehealth: Payer: Self-pay | Admitting: Medical

## 2017-09-08 DIAGNOSIS — M5136 Other intervertebral disc degeneration, lumbar region: Secondary | ICD-10-CM

## 2017-09-08 DIAGNOSIS — M6281 Muscle weakness (generalized): Secondary | ICD-10-CM

## 2017-09-08 DIAGNOSIS — M25551 Pain in right hip: Secondary | ICD-10-CM

## 2017-09-08 NOTE — Telephone Encounter (Signed)
Refer to [facility] for back pain, hip pain

## 2017-09-08 NOTE — Telephone Encounter (Signed)
Referral sent 

## 2017-09-08 NOTE — Telephone Encounter (Signed)
Pt called requesting that we focus on getting him help with his walking since walking is getting really bad right now. They know that several referrals are in progress but would like that the specialist that can help with walking be priority.

## 2017-09-08 NOTE — Telephone Encounter (Signed)
Laureen Abrahams   Please advise

## 2017-09-10 DIAGNOSIS — Z4802 Encounter for removal of sutures: Secondary | ICD-10-CM | POA: Diagnosis not present

## 2017-09-12 ENCOUNTER — Encounter (INDEPENDENT_AMBULATORY_CARE_PROVIDER_SITE_OTHER): Payer: Self-pay | Admitting: Orthopaedic Surgery

## 2017-09-12 ENCOUNTER — Ambulatory Visit (INDEPENDENT_AMBULATORY_CARE_PROVIDER_SITE_OTHER): Payer: BLUE CROSS/BLUE SHIELD | Admitting: Orthopaedic Surgery

## 2017-09-12 ENCOUNTER — Other Ambulatory Visit: Payer: BLUE CROSS/BLUE SHIELD

## 2017-09-12 DIAGNOSIS — K759 Inflammatory liver disease, unspecified: Secondary | ICD-10-CM | POA: Diagnosis not present

## 2017-09-12 DIAGNOSIS — M545 Low back pain: Secondary | ICD-10-CM | POA: Diagnosis not present

## 2017-09-12 DIAGNOSIS — M542 Cervicalgia: Secondary | ICD-10-CM | POA: Diagnosis not present

## 2017-09-12 DIAGNOSIS — R945 Abnormal results of liver function studies: Secondary | ICD-10-CM | POA: Diagnosis not present

## 2017-09-12 DIAGNOSIS — I2510 Atherosclerotic heart disease of native coronary artery without angina pectoris: Secondary | ICD-10-CM

## 2017-09-12 DIAGNOSIS — R7989 Other specified abnormal findings of blood chemistry: Secondary | ICD-10-CM

## 2017-09-12 DIAGNOSIS — G8929 Other chronic pain: Secondary | ICD-10-CM | POA: Diagnosis not present

## 2017-09-12 NOTE — Progress Notes (Signed)
Office Visit Note   Patient: Duane Alexander           Date of Birth: 22-Jan-1955           MRN: [id number] Visit Date: 09/12/2017              Requested by: Carlena Hurl, PA-C [address] [place], [place] [address] PCP: Carlena Hurl, PA-C   Assessment & Plan: Visit Diagnoses:  1. Chronic bilateral low back pain, with sciatica presence unspecified   2. Cervicalgia     Plan: Overall impression is myelopathy.  He needs to have his entire spine imaged to evaluate for myelopathy and myelomalacia.  Follow-up after the MRI.  Follow-Up Instructions: Return in about 2 weeks (around 09/26/2017).   Orders:  Orders Placed This Encounter  Procedures  . MR Cervical Spine w/o contrast  . MR Thoracic Spine w/o contrast  . MR Lumbar Spine w/o contrast   No orders of the defined types were placed in this encounter.     Procedures: No procedures performed   Clinical Data: No additional findings.   Subjective: Chief Complaint  Patient presents with  . Right Hip - Pain    Duane Alexander is a 62 year old gentleman who comes in with right hip numbness and weakness and bilateral upper extremity numbness and difficulty with the use for about 3 months.  He feels that he has no upper body strength.  Denies any bowel or bladder incontinence.  Denies any constitutional symptoms or injuries.   Review of Systems  Constitutional: Negative.   All other systems reviewed and are negative.    Objective: Vital Signs: There were no vitals taken for this visit.  Physical Exam  Constitutional: He is oriented to person, place, and time. He appears well-developed and well-nourished.  HENT:  Head: Normocephalic and atraumatic.  Eyes: Pupils are equal, round, and reactive to light.  Neck: Neck supple.  Pulmonary/Chest: Effort normal.  Abdominal: Soft.  Musculoskeletal: Normal range of motion.  Neurological: He is alert and oriented to person, place, and time.  Skin: Skin is warm.    Psychiatric: He has a normal mood and affect. His behavior is normal. Judgment and thought content normal.  Nursing note and vitals reviewed.   Ortho Exam Right hip exam shows weakness of hip flexors.  He does have hyperreflexia of the patella tendon.  He has hyperreflexia of bilateral upper extremities. Specialty Comments:  No specialty comments available.  Imaging: No results found.   PMFS History: Patient Active Problem List   Diagnosis Date Noted  . Coronary artery calcification seen on CT scan 09/05/2017  . Arm numbness 09/01/2017  . Muscle weakness 09/01/2017  . Right hip pain 09/01/2017  . DDD (degenerative disc disease), lumbar 09/01/2017  . Right leg weakness 09/01/2017  . Syncope 09/01/2017  . Decreased pulse 09/01/2017  . COPD (chronic obstructive pulmonary disease) ([place]) 07/01/2016  . Special screening for malignant neoplasms, colon 06/17/2016  . Elevated LFTs 06/02/2016  . Hypercholesterolemia 06/02/2016  . Aortic atherosclerosis ([place]) 05/19/2016  . Screening for prostate cancer 05/16/2016  . Hypertension 05/10/2016  . Screen for colon cancer 05/10/2016  . Family history of lung cancer 05/10/2016  . Chronic left shoulder pain 04/28/2016  . Tinnitus 04/28/2016  . Lump of skin 04/28/2016  . Smoking 04/28/2016  . Wheezing 04/28/2016   Past Medical History:  Diagnosis Date  . Aortic atherosclerosis ([place])   . COPD (chronic obstructive pulmonary disease) ([place])   . Family history of lung  cancer   . Hypertension   . Smoker   . Wears glasses     Family History  Problem Relation Age of Onset  . Cancer Mother        lung  . Other Father        died in his 62s of stomach issues?  . Cancer Brother 38       lung  . Other Sister        GI related death  . Heart disease Neg Hx   . Hypertension Neg Hx   . Stroke Neg Hx   . Colon cancer Neg Hx     Past Surgical History:  Procedure Laterality Date  . COLONOSCOPY N/A 01/19/2017   Procedure: COLONOSCOPY;   Surgeon: Rogene Houston, MD;  [facility];  Service: Endoscopy;  Laterality: N/A;  930-moved to 9:45 per Lelon Frohlich  . POLYPECTOMY  01/19/2017   Procedure: POLYPECTOMY;  Surgeon: Rogene Houston, MD;  [facility];  Service: Endoscopy;;  splenic flexure  . Right eye surgery     as a child-removed muscle from leg and put in upper eye lid   Social History   Occupational History  . Not on file  Tobacco Use  . Smoking status: Current Every Day Smoker    Packs/day: 1.00    Years: 45.00    Pack years: 45.00    Types: Cigarettes  . Smokeless tobacco: Never Used  Substance and Sexual Activity  . Alcohol use: Yes    Alcohol/week: 10.0 standard drinks    Types: 10 Cans of beer per week  . Drug use: No  . Sexual activity: Yes    Birth control/protection: None

## 2017-09-15 LAB — HCV RNA QUANT
HCV LOG10: 6.895 {Log_IU}/mL
HEPATITIS C QUANTITATION: 7850000 [IU]/mL

## 2017-09-15 LAB — HIV ANTIBODY (ROUTINE TESTING W REFLEX): HIV Screen 4th Generation wRfx: NONREACTIVE

## 2017-09-15 LAB — HEPATITIS C GENOTYPE

## 2017-09-19 ENCOUNTER — Ambulatory Visit
Admission: RE | Admit: 2017-09-19 | Discharge: 2017-09-19 | Disposition: A | Discharge status: Home / Self Care | Payer: BLUE CROSS/BLUE SHIELD | Source: Ambulatory Visit | Attending: Medical | Admitting: Medical

## 2017-09-19 DIAGNOSIS — R7989 Other specified abnormal findings of blood chemistry: Secondary | ICD-10-CM

## 2017-09-19 DIAGNOSIS — K759 Inflammatory liver disease, unspecified: Secondary | ICD-10-CM

## 2017-09-19 DIAGNOSIS — K7460 Unspecified cirrhosis of liver: Secondary | ICD-10-CM | POA: Diagnosis not present

## 2017-09-19 DIAGNOSIS — R945 Abnormal results of liver function studies: Principal | ICD-10-CM

## 2017-09-19 MED ORDER — IOPAMIDOL (ISOVUE-300) INJECTION 61%
100.0000 mL | Freq: Once | INTRAVENOUS | Status: AC | PRN
  Administered 2017-09-19: 100 mL via INTRAVENOUS

## 2017-09-20 ENCOUNTER — Other Ambulatory Visit: Payer: BLUE CROSS/BLUE SHIELD

## 2017-09-21 ENCOUNTER — Telehealth (INDEPENDENT_AMBULATORY_CARE_PROVIDER_SITE_OTHER): Payer: Self-pay | Admitting: Orthopaedic Surgery

## 2017-09-21 NOTE — Telephone Encounter (Signed)
Patient called wanting to know what going on with MRI-per note in referrals Sabrina sent note to you and I advised patient someone will give him a call back.  Please call patient to advise

## 2017-09-21 NOTE — Telephone Encounter (Signed)
Returned call to patient left message to call back with more information concerning [employer]    [phone]

## 2017-09-22 ENCOUNTER — Ambulatory Visit (INDEPENDENT_AMBULATORY_CARE_PROVIDER_SITE_OTHER): Payer: BLUE CROSS/BLUE SHIELD | Admitting: Orthopaedic Surgery

## 2017-09-22 NOTE — Telephone Encounter (Signed)
C-spine a& T-spine approved only.   C-spine Approval number [id number] T-spine Approval number  [id number]   Lspine denied

## 2017-09-22 NOTE — Telephone Encounter (Signed)
DOING PEER TO PEER NOW.

## 2017-09-25 ENCOUNTER — Other Ambulatory Visit: Payer: Self-pay | Admitting: Cardiovascular Disease

## 2017-09-25 DIAGNOSIS — I700 Atherosclerosis of aorta: Secondary | ICD-10-CM

## 2017-09-25 NOTE — Telephone Encounter (Signed)
I spoke with pt and advised him 2out 3 got approved and imaging wil be caling to get him scheduled

## 2017-09-28 ENCOUNTER — Inpatient Hospital Stay (HOSPITAL_COMMUNITY): Admission: RE | Admit: 2017-09-28 | Payer: BLUE CROSS/BLUE SHIELD | Source: Ambulatory Visit

## 2017-09-28 ENCOUNTER — Encounter (HOSPITAL_COMMUNITY): Payer: BLUE CROSS/BLUE SHIELD

## 2017-09-29 ENCOUNTER — Ambulatory Visit: Payer: BLUE CROSS/BLUE SHIELD | Admitting: Infectious Diseases

## 2017-09-29 NOTE — Progress Notes (Deleted)
Patient Name: Duane Alexander  Date of Birth: [birth year]  MRN: [id number]  PCP: Carlena Hurl, PA-C  Referring Provider: Carlena Hurl, PA-C   Patient Active Problem List   Diagnosis Date Noted  . Coronary artery calcification seen on CT scan 09/05/2017  . Arm numbness 09/01/2017  . Muscle weakness 09/01/2017  . Right hip pain 09/01/2017  . DDD (degenerative disc disease), lumbar 09/01/2017  . Right leg weakness 09/01/2017  . Syncope 09/01/2017  . Decreased pulse 09/01/2017  . COPD (chronic obstructive pulmonary disease) ([place]) 07/01/2016  . Special screening for malignant neoplasms, colon 06/17/2016  . Elevated LFTs 06/02/2016  . Hypercholesterolemia 06/02/2016  . Aortic atherosclerosis ([place]) 05/19/2016  . Screening for prostate cancer 05/16/2016  . Hypertension 05/10/2016  . Screen for colon cancer 05/10/2016  . Family history of lung cancer 05/10/2016  . Chronic left shoulder pain 04/28/2016  . Tinnitus 04/28/2016  . Lump of skin 04/28/2016  . Smoking 04/28/2016  . Wheezing 04/28/2016   SUBJECTIVE: No chief complaint on file.   HPI/ROS:  Duane Alexander is a 62 y.o. male who presents for initial evaluation and management of chronic hepatitis C. Patient tested positive 09/12/17 with Hep C RNA 7,850,000 copies, genotype 1b. Hepatitis C-associated risk factors present are: {hep c risks pos:13207}. Patient denies {hep c risks neg:13208}. Patient {has/not:15037} had other studies performed. Results: AST 58 ALT 41 Albumin 4.5 TBili 0.8. Patient {has/not:15037} had prior treatment for Hepatitis C. Patient {does/do/not:33181} have a past history of liver disease. Patient {does/do/not:33181} have a family history of liver disease. Patient {does/do/not:33181}  have associated signs or symptoms related to liver disease.  Labs reviewed and confirm chronic hepatitis C with a positive viral load.   Records reviewed from ***  ***  Patient {does/does not:19866} have documented  immunity to Hepatitis A. Patient {does/does not:19867} have documented immunity to Hepatitis B.    {ros - complete:22885} All other systems reviewed and are negative      Past Medical History:  Diagnosis Date  . Aortic atherosclerosis ([place])   . COPD (chronic obstructive pulmonary disease) ([place])   . Family history of lung cancer   . Hypertension   . Smoker   . Wears glasses     Prior to Admission medications   Medication Sig Start Date End Date Taking? Authorizing Provider  aspirin EC 81 MG tablet Take 1 tablet (81 mg total) by mouth daily. 07/19/17   Tysinger, [name], PA-C  gabapentin (NEURONTIN) 300 MG capsule Take 1 capsule (300 mg total) by mouth at bedtime. 09/05/17 09/05/18  Tysinger, [name], PA-C  lisinopril (PRINIVIL,ZESTRIL) 20 MG tablet Take 1 tablet (20 mg total) by mouth daily. 07/19/17   Tysinger, [name], PA-C  lovastatin (MEVACOR) 20 MG tablet Take 1 tablet (20 mg total) by mouth at bedtime. 07/19/17   Tysinger, [name], PA-C  Multiple Vitamin (MULTIVITAMIN) tablet Take 1 tablet by mouth daily.    [provider]    No Known Allergies  Social History   Tobacco Use  . Smoking status: Current Every Day Smoker    Packs/day: 1.00    Years: 45.00    Pack years: 45.00    Types: Cigarettes  . Smokeless tobacco: Never Used  Substance Use Topics  . Alcohol use: Yes    Alcohol/week: 10.0 standard drinks    Types: 10 Cans of beer per week  . Drug use: No    Family History  Problem Relation Age of Onset  . Cancer  Mother        lung  . Other Father        died in his 3s of stomach issues?  . Cancer Brother 36       lung  . Other Sister        GI related death  . Heart disease Neg Hx   . Hypertension Neg Hx   . Stroke Neg Hx   . Colon cancer Neg Hx    ***  Objective:  There were no vitals filed for this visit. Constitutional: {EXAM; GENERAL APPEARANCE:5021} Eyes: anicteric Cardiovascular: {Mis exam cardio:32073} Respiratory: {Exam; lungs  brief:12271} Gastrointestinal: {Exam; abdomen:5794::"Bowel sounds are normal","liver is not enlarged","spleen is not enlarged"} Musculoskeletal: {extremities:315109::"peripheral pulses normal, no pedal edema, no clubbing or cyanosis"} Skin: {Skin exam gi:12013}; no porphyria cutanea tarda Lymphatic: no cervical lymphadenopathy   Laboratory: Genotype: No results found for: HCVGENOTYPE HCV viral load: No results found for: HCVQUANT Lab Results  Component Value Date   WBC 6.8 07/17/2017   HGB 15.4 07/17/2017   HCT 44.4 07/17/2017   MCV 101 (H) 07/17/2017   PLT 244 07/17/2017    Lab Results  Component Value Date   CREATININE 0.78 07/17/2017   BUN 16 07/17/2017   NA 139 07/17/2017   K 4.4 07/17/2017   CL 97 07/17/2017   CO2 26 07/17/2017    Lab Results  Component Value Date   ALT 41 09/01/2017   AST 58 (H) 09/01/2017   ALKPHOS 57 09/01/2017    Lab Results  Component Value Date   BILITOT 0.8 09/01/2017   ALBUMIN 4.5 09/01/2017    Labs and history reviewed and show CHILD-PUGH *** 5-6 points: Child class A 7-9 points: Child class B 10-15 points: Child class C  Imaging:  Assessment & Plan:   Problem List Items Addressed This Visit    None      I spent 45 minutes with the patient including greater than 70% of time in face to face counsel of the patient re hepatitis c and the details described above and in coordination of their care.  Janene Madeira, MSN, NP-C [facility] [email or web address] Pager: [phone] Office: [phone]

## 2017-10-06 ENCOUNTER — Other Ambulatory Visit: Payer: BLUE CROSS/BLUE SHIELD

## 2017-10-06 ENCOUNTER — Ambulatory Visit
Admission: RE | Admit: 2017-10-06 | Discharge: 2017-10-06 | Disposition: A | Discharge status: Home / Self Care | Payer: BLUE CROSS/BLUE SHIELD | Source: Ambulatory Visit | Attending: Orthopaedic Surgery | Admitting: Orthopaedic Surgery

## 2017-10-06 ENCOUNTER — Other Ambulatory Visit (INDEPENDENT_AMBULATORY_CARE_PROVIDER_SITE_OTHER): Payer: Self-pay | Admitting: Radiology

## 2017-10-06 DIAGNOSIS — M542 Cervicalgia: Secondary | ICD-10-CM

## 2017-10-06 DIAGNOSIS — M545 Low back pain, unspecified: Secondary | ICD-10-CM

## 2017-10-06 DIAGNOSIS — M4802 Spinal stenosis, cervical region: Secondary | ICD-10-CM | POA: Diagnosis not present

## 2017-10-06 DIAGNOSIS — M5124 Other intervertebral disc displacement, thoracic region: Secondary | ICD-10-CM | POA: Diagnosis not present

## 2017-10-06 DIAGNOSIS — G8929 Other chronic pain: Secondary | ICD-10-CM

## 2017-10-06 DIAGNOSIS — M4804 Spinal stenosis, thoracic region: Secondary | ICD-10-CM | POA: Diagnosis not present

## 2017-10-06 NOTE — Progress Notes (Signed)
Urgent referral to nundkumar.  Thanks.

## 2017-10-06 NOTE — Progress Notes (Signed)
This is the guy I spoke to you about.

## 2017-10-09 ENCOUNTER — Ambulatory Visit: Payer: BLUE CROSS/BLUE SHIELD | Admitting: Interventional Cardiology

## 2017-10-11 ENCOUNTER — Ambulatory Visit (INDEPENDENT_AMBULATORY_CARE_PROVIDER_SITE_OTHER): Payer: BLUE CROSS/BLUE SHIELD | Admitting: Orthopaedic Surgery

## 2017-10-20 ENCOUNTER — Ambulatory Visit: Payer: BLUE CROSS/BLUE SHIELD | Admitting: Infectious Diseases

## 2017-10-20 DIAGNOSIS — B182 Chronic viral hepatitis C: Secondary | ICD-10-CM | POA: Insufficient documentation

## 2017-10-20 NOTE — Progress Notes (Deleted)
Patient Name: Duane Alexander  Date of Birth: [birth year]  MRN: [id number]  PCP: [name]  Referring Provider: ***  Patient Active Problem List   Diagnosis Date Noted  . Chronic hepatitis C without hepatic coma ([place]) 10/20/2017  . Coronary artery calcification seen on CT scan 09/05/2017  . Arm numbness 09/01/2017  . Muscle weakness 09/01/2017  . Right hip pain 09/01/2017  . DDD (degenerative disc disease), lumbar 09/01/2017  . Right leg weakness 09/01/2017  . Syncope 09/01/2017  . Decreased pulse 09/01/2017  . COPD (chronic obstructive pulmonary disease) ([place]) 07/01/2016  . Special screening for malignant neoplasms, colon 06/17/2016  . Elevated LFTs 06/02/2016  . Hypercholesterolemia 06/02/2016  . Aortic atherosclerosis ([place]) 05/19/2016  . Screening for prostate cancer 05/16/2016  . Hypertension 05/10/2016  . Screen for colon cancer 05/10/2016  . Family history of lung cancer 05/10/2016  . Chronic left shoulder pain 04/28/2016  . Tinnitus 04/28/2016  . Lump of skin 04/28/2016  . Smoking 04/28/2016  . Wheezing 04/28/2016    SUBJECTIVE:  Duane Alexander is a 62 y.o. male who presents for initial evaluation and management of chronic hepatitis C.    HPI/ROS:  [name] Patient tested positive 09/12/2017. Hepatitis C-associated risk factors present are: {hep c risks pos:13207}. Patient denies {hep c risks neg:13208}. Patient {has/not:15037} had other studies performed. Results: {hep c studies:13209}. Patient {has/not:15037} had prior treatment for Hepatitis C. Patient {does/do/not:33181} have a past history of liver disease. Patient {does/do/not:33181} have a family history of liver disease. Patient {does/do/not:33181}  have associated signs or symptoms related to liver disease.  Labs reviewed and confirm chronic hepatitis C with a positive viral load.   Records reviewed from ***  ***  Patient {does/does not:19866} have documented immunity to Hepatitis A.  Patient {does/does not:19867} have documented immunity to Hepatitis B.    {ros - complete:22885} All other systems reviewed and are negative      Past Medical History:  Diagnosis Date  . Aortic atherosclerosis ([place])   . COPD (chronic obstructive pulmonary disease) ([place])   . Family history of lung cancer   . Hypertension   . Smoker   . Wears glasses     Prior to Admission medications   Medication Sig Start Date End Date Taking? Authorizing Provider  aspirin EC 81 MG tablet Take 1 tablet (81 mg total) by mouth daily. 07/19/17   Tysinger, [name], PA-C  gabapentin (NEURONTIN) 300 MG capsule Take 1 capsule (300 mg total) by mouth at bedtime. 09/05/17 09/05/18  Tysinger, [name], PA-C  lisinopril (PRINIVIL,ZESTRIL) 20 MG tablet Take 1 tablet (20 mg total) by mouth daily. 07/19/17   Tysinger, [name], PA-C  lovastatin (MEVACOR) 20 MG tablet Take 1 tablet (20 mg total) by mouth at bedtime. 07/19/17   Tysinger, [name], PA-C  Multiple Vitamin (MULTIVITAMIN) tablet Take 1 tablet by mouth daily.    [provider]    No Known Allergies  Social History   Tobacco Use  . Smoking status: Current Every Day Smoker    Packs/day: 1.00    Years: 45.00    Pack years: 45.00    Types: Cigarettes  . Smokeless tobacco: Never Used  Substance Use Topics  . Alcohol use: Yes    Alcohol/week: 10.0 standard drinks    Types: 10 Cans of beer per week  . Drug use: No    Family History  Problem Relation Age of Onset  . Cancer Mother  lung  . Other Father        died in his 66s of stomach issues?  . Cancer Brother 79       lung  . Other Sister        GI related death  . Heart disease Neg Hx   . Hypertension Neg Hx   . Stroke Neg Hx   . Colon cancer Neg Hx    ***  Objective:  There were no vitals filed for this visit. Constitutional: {EXAM; GENERAL APPEARANCE:5021} Eyes: anicteric Cardiovascular: {Mis exam cardio:32073} Respiratory: {Exam; lungs brief:12271} Gastrointestinal:  {Exam; abdomen:5794::"Bowel sounds are normal","liver is not enlarged","spleen is not enlarged"} Musculoskeletal: {extremities:315109::"peripheral pulses normal, no pedal edema, no clubbing or cyanosis"} Skin: {Skin exam gi:12013}; no porphyria cutanea tarda Lymphatic: no cervical lymphadenopathy   Laboratory: Genotype: 09/12/17 1b HCV viral load: 09/12/17 7,850,000  Lab Results  Component Value Date   WBC 6.8 07/17/2017   HGB 15.4 07/17/2017   HCT 44.4 07/17/2017   MCV 101 (H) 07/17/2017   PLT 244 07/17/2017    Lab Results  Component Value Date   CREATININE 0.78 07/17/2017   BUN 16 07/17/2017   NA 139 07/17/2017   K 4.4 07/17/2017   CL 97 07/17/2017   CO2 26 07/17/2017    Lab Results  Component Value Date   ALT 41 09/01/2017   AST 58 (H) 09/01/2017   ALKPHOS 57 09/01/2017    Lab Results  Component Value Date   BILITOT 0.8 09/01/2017   ALBUMIN 4.5 09/01/2017    Imaging:  none  Assessment & Plan:   Problem List Items Addressed This Visit    None      I spent 45 minutes with the patient including greater than 70% of time in face to face counsel of the patient re hepatitis c and the details described above and in coordination of their care.  Janene Madeira, MSN, NP-C [facility] [email or web address] Pager: [phone] Office: [phone]

## 2017-10-27 ENCOUNTER — Ambulatory Visit (HOSPITAL_COMMUNITY)
Admission: RE | Admit: 2017-10-27 | Payer: BLUE CROSS/BLUE SHIELD | Source: Ambulatory Visit | Attending: Cardiovascular Disease | Admitting: Cardiovascular Disease

## 2017-10-27 ENCOUNTER — Ambulatory Visit (HOSPITAL_COMMUNITY)
Admission: RE | Admit: 2017-10-27 | Disposition: A | Discharge status: Home / Self Care | Payer: BLUE CROSS/BLUE SHIELD | Source: Ambulatory Visit | Attending: Cardiovascular Disease | Admitting: Cardiovascular Disease

## 2017-10-27 DIAGNOSIS — R0989 Other specified symptoms and signs involving the circulatory and respiratory systems: Secondary | ICD-10-CM

## 2017-10-30 ENCOUNTER — Other Ambulatory Visit: Payer: Self-pay | Admitting: Cardiovascular Disease

## 2017-10-30 DIAGNOSIS — I700 Atherosclerosis of aorta: Secondary | ICD-10-CM

## 2017-10-30 DIAGNOSIS — R0989 Other specified symptoms and signs involving the circulatory and respiratory systems: Secondary | ICD-10-CM

## 2017-11-06 DIAGNOSIS — M5000 Cervical disc disorder with myelopathy, unspecified cervical region: Secondary | ICD-10-CM | POA: Diagnosis not present

## 2017-11-09 ENCOUNTER — Inpatient Hospital Stay (HOSPITAL_COMMUNITY): Admission: RE | Admit: 2017-11-09 | Payer: BLUE CROSS/BLUE SHIELD | Source: Ambulatory Visit

## 2017-11-24 ENCOUNTER — Ambulatory Visit (HOSPITAL_COMMUNITY)
Admission: RE | Admit: 2017-11-24 | Payer: BLUE CROSS/BLUE SHIELD | Source: Ambulatory Visit | Attending: Cardiovascular Disease | Admitting: Cardiovascular Disease

## 2017-11-24 DIAGNOSIS — R0989 Other specified symptoms and signs involving the circulatory and respiratory systems: Secondary | ICD-10-CM

## 2017-11-27 ENCOUNTER — Encounter (HOSPITAL_COMMUNITY): Payer: Self-pay | Admitting: Cardiology

## 2017-11-28 ENCOUNTER — Other Ambulatory Visit: Payer: Self-pay | Admitting: Medical

## 2017-11-28 NOTE — Telephone Encounter (Signed)
Is this ok to refill?  

## 2017-11-29 ENCOUNTER — Telehealth: Payer: Self-pay | Admitting: Medical

## 2017-11-29 NOTE — Telephone Encounter (Signed)
Referral was closed due to excessive no show appointments.

## 2017-11-29 NOTE — Telephone Encounter (Signed)
Please call him to find out what is going on or why he is not following up with the hepatitis clinic as this is a serious issue  We just want to help him get the right care

## 2017-11-29 NOTE — Telephone Encounter (Signed)
I would recommend he go for the initial consult at least.  Treatment recommendations, insurance coverage, and costs can all be discussed then and there are other resources may potentially be able to help

## 2017-11-29 NOTE — Telephone Encounter (Signed)
Patient states that he has been sick, money short and other problems and he just cant go right now.  I advised him to please contact them and go see them due to this is a serious health concern.

## 2017-11-29 NOTE — Telephone Encounter (Signed)
Patient notified

## 2017-11-29 NOTE — Telephone Encounter (Signed)
I don't see that he has had appt with hepatitis clinic/infectious disease from our referral.  Please inquire

## 2017-12-03 HISTORY — PX: ANTERIOR CERVICAL DECOMP/DISCECTOMY FUSION: SHX1161

## 2017-12-14 DIAGNOSIS — M4802 Spinal stenosis, cervical region: Secondary | ICD-10-CM | POA: Diagnosis not present

## 2017-12-14 DIAGNOSIS — M5020 Other cervical disc displacement, unspecified cervical region: Secondary | ICD-10-CM | POA: Diagnosis not present

## 2018-01-01 DIAGNOSIS — M5000 Cervical disc disorder with myelopathy, unspecified cervical region: Secondary | ICD-10-CM | POA: Diagnosis not present

## 2018-02-07 ENCOUNTER — Ambulatory Visit: Payer: BLUE CROSS/BLUE SHIELD | Admitting: Cardiovascular Disease

## 2018-02-26 DIAGNOSIS — M5000 Cervical disc disorder with myelopathy, unspecified cervical region: Secondary | ICD-10-CM | POA: Diagnosis not present

## 2018-05-01 ENCOUNTER — Telehealth: Payer: Self-pay | Admitting: Cardiovascular Disease

## 2018-05-01 NOTE — Telephone Encounter (Signed)
LVM for patient to change appt to phone or video appt.

## 2018-05-01 NOTE — Telephone Encounter (Signed)
LVM to call back with preference for appointment on 05-11-18.

## 2018-05-11 ENCOUNTER — Ambulatory Visit: Payer: BLUE CROSS/BLUE SHIELD | Admitting: Cardiovascular Disease

## 2018-08-02 ENCOUNTER — Other Ambulatory Visit: Payer: Self-pay | Admitting: Medical

## 2018-08-02 NOTE — Telephone Encounter (Signed)
Is this ok to refill?  

## 2018-08-03 ENCOUNTER — Telehealth: Payer: Self-pay | Admitting: Medical

## 2018-08-03 ENCOUNTER — Other Ambulatory Visit: Payer: Self-pay | Admitting: Medical

## 2018-08-03 NOTE — Telephone Encounter (Signed)
Refill sent, get in for physical or med check, fasting

## 2018-08-03 NOTE — Telephone Encounter (Signed)
Is this ok to refill?  

## 2018-08-06 NOTE — Telephone Encounter (Signed)
Pt coming in Friday for CPE

## 2018-08-06 NOTE — Telephone Encounter (Signed)
Left message on voicemail for patient to call back and schedule CPE.   

## 2018-08-10 ENCOUNTER — Ambulatory Visit (INDEPENDENT_AMBULATORY_CARE_PROVIDER_SITE_OTHER): Payer: BC Managed Care – PPO | Admitting: Medical

## 2018-08-10 ENCOUNTER — Encounter: Payer: Self-pay | Admitting: Medical

## 2018-08-10 ENCOUNTER — Other Ambulatory Visit: Payer: Self-pay

## 2018-08-10 VITALS — BP 150/100 | HR 65 | Temp 98.6°F | Resp 16 | Ht 72.0 in | Wt 169.0 lb

## 2018-08-10 DIAGNOSIS — Z0000 Encounter for general adult medical examination without abnormal findings: Secondary | ICD-10-CM

## 2018-08-10 DIAGNOSIS — G629 Polyneuropathy, unspecified: Secondary | ICD-10-CM | POA: Insufficient documentation

## 2018-08-10 DIAGNOSIS — M79601 Pain in right arm: Secondary | ICD-10-CM

## 2018-08-10 DIAGNOSIS — J449 Chronic obstructive pulmonary disease, unspecified: Secondary | ICD-10-CM

## 2018-08-10 DIAGNOSIS — L989 Disorder of the skin and subcutaneous tissue, unspecified: Secondary | ICD-10-CM | POA: Insufficient documentation

## 2018-08-10 DIAGNOSIS — I700 Atherosclerosis of aorta: Secondary | ICD-10-CM | POA: Diagnosis not present

## 2018-08-10 DIAGNOSIS — Z23 Encounter for immunization: Secondary | ICD-10-CM

## 2018-08-10 DIAGNOSIS — Z9119 Patient's noncompliance with other medical treatment and regimen: Secondary | ICD-10-CM | POA: Insufficient documentation

## 2018-08-10 DIAGNOSIS — R200 Anesthesia of skin: Secondary | ICD-10-CM

## 2018-08-10 DIAGNOSIS — B182 Chronic viral hepatitis C: Secondary | ICD-10-CM

## 2018-08-10 DIAGNOSIS — M79605 Pain in left leg: Secondary | ICD-10-CM

## 2018-08-10 DIAGNOSIS — M79604 Pain in right leg: Secondary | ICD-10-CM

## 2018-08-10 DIAGNOSIS — G8929 Other chronic pain: Secondary | ICD-10-CM | POA: Insufficient documentation

## 2018-08-10 DIAGNOSIS — I2510 Atherosclerotic heart disease of native coronary artery without angina pectoris: Secondary | ICD-10-CM | POA: Diagnosis not present

## 2018-08-10 DIAGNOSIS — I10 Essential (primary) hypertension: Secondary | ICD-10-CM

## 2018-08-10 DIAGNOSIS — Z91199 Patient's noncompliance with other medical treatment and regimen due to unspecified reason: Secondary | ICD-10-CM

## 2018-08-10 DIAGNOSIS — F17200 Nicotine dependence, unspecified, uncomplicated: Secondary | ICD-10-CM

## 2018-08-10 DIAGNOSIS — Z125 Encounter for screening for malignant neoplasm of prostate: Secondary | ICD-10-CM

## 2018-08-10 DIAGNOSIS — M5136 Other intervertebral disc degeneration, lumbar region: Secondary | ICD-10-CM

## 2018-08-10 DIAGNOSIS — Z7185 Encounter for immunization safety counseling: Secondary | ICD-10-CM | POA: Insufficient documentation

## 2018-08-10 DIAGNOSIS — E7800 Pure hypercholesterolemia, unspecified: Secondary | ICD-10-CM

## 2018-08-10 DIAGNOSIS — M79602 Pain in left arm: Secondary | ICD-10-CM

## 2018-08-10 DIAGNOSIS — Z7189 Other specified counseling: Secondary | ICD-10-CM

## 2018-08-10 NOTE — Progress Notes (Signed)
Subjective: Chief Complaint  Patient presents with  . CPE    fasting CPE  needs refills   Here for physical today  Medical team: Dr. Sanda Klein, cardiology Dr. Hildred Laser, GI Dr. Frankey Shown, Ortho Dr. Consuella Lose, neurosurgery Tysinger, [name], PA-C here for primary care  Concerns: His main concerns today are ongoing numbness in the hands and feet specifically the toes.  Last year we had referred to orthopedics.  He notes that he ended up having an MRI of the thoracic and cervical spine but insurance for whatever reason would not approve the lumbar spine.  Even though 1 of his main concerns was chronic leg and back pain and numbness in the toes.  He ultimately was referred to neurosurgery and had neck surgery in December.  He had anterior cervical discectomy and fusion of C3-4 C4-5.  They notes that even after surgery continues to have pains from his shoulder to his elbow and numbness in his hands regularly.  He feels like the leg and toe issue was never really addressed  Last week he went tubing down a river and got really bad sun poisoning, leg swelled up really big had skin blisters.  Was sick for several days, could not eat, could not walk.  This finally resolved.  Otherwise no other recent complaints.  Hypertension-he is compliant with his blood pressure medicine without complaint.  No chest pain no difficulty breathing no palpitations no other swelling  Hyperlipidemia-he is compliant with his cholesterol medicine without complaint  He continues to smoke  He works full-time  Past Medical History:  Diagnosis Date  . Aortic atherosclerosis ([place])   . COPD (chronic obstructive pulmonary disease) ([place])    per CT findings 2019  . Family history of lung cancer   . Hepatitis C    referred to hepatitis clinic 2019 but he never went  . Hyperlipidemia   . Hypertension   . Smoker   . Wears glasses     Past Surgical History:  Procedure Laterality Date  . ANTERIOR  CERVICAL DECOMP/DISCECTOMY FUSION  12/2017   Dr. Consuella Lose  . COLONOSCOPY N/A 01/19/2017   Procedure: COLONOSCOPY;  Surgeon: Rogene Houston, MD;  Location: [facility];  Service: Endoscopy;  Laterality: N/A;  930-moved to 9:45 per Lelon Frohlich  . POLYPECTOMY  01/19/2017   Procedure: POLYPECTOMY;  Surgeon: Rogene Houston, MD;  Location: [facility];  Service: Endoscopy;;  splenic flexure  . Right eye surgery     as a child-removed muscle from leg and put in upper eye lid    Social History   Socioeconomic History  . Marital status: Widowed    Spouse name: Not on file  . Number of children: Not on file  . Years of education: Not on file  . Highest education level: Not on file  Occupational History  . Not on file  Social Needs  . Financial resource strain: Not on file  . Food insecurity    Worry: Not on file    Inability: Not on file  . Transportation needs    Medical: Not on file    Non-medical: Not on file  Tobacco Use  . Smoking status: Current Every Day Smoker    Packs/day: 1.00    Years: 45.00    Pack years: 45.00    Types: Cigarettes  . Smokeless tobacco: Never Used  Substance and Sexual Activity  . Alcohol use: Yes    Alcohol/week: 12.0 standard drinks  Types: 12 Cans of beer per week  . Drug use: No  . Sexual activity: Yes    Birth control/protection: None  Lifestyle  . Physical activity    Days per week: Not on file    Minutes per session: Not on file  . Stress: Not on file  Relationships  . Social Herbalist on phone: Not on file    Gets together: Not on file    Attends religious service: Not on file    Active member of club or organization: Not on file    Attends meetings of clubs or organizations: Not on file    Relationship status: Not on file  . Intimate partner violence    Fear of current or ex partner: Not on file    Emotionally abused: Not on file    Physically abused: Not on file    Forced sexual activity: Not on file  Other  Topics Concern  . Not on file  Social History Narrative   Has girlfriend.  08/2018    Family History  Problem Relation Age of Onset  . Cancer Mother        lung  . Other Father        died in his 1s of stomach issues?  . Cancer Brother 35       lung  . Other Sister        GI related death  . Heart disease Neg Hx   . Hypertension Neg Hx   . Stroke Neg Hx   . Colon cancer Neg Hx      Current Outpatient Medications:  .  lisinopril (ZESTRIL) 20 MG tablet, TAKE 1 TABLET(20 MG) BY MOUTH DAILY, Disp: 30 tablet, Rfl: 0 .  lovastatin (MEVACOR) 20 MG tablet, TAKE 1 TABLET(20 MG) BY MOUTH AT BEDTIME, Disp: 30 tablet, Rfl: 0 .  Multiple Vitamin (MULTIVITAMIN) tablet, Take 1 tablet by mouth daily., Disp: , Rfl:  .  aspirin EC 81 MG tablet, Take 1 tablet (81 mg total) by mouth daily. (Patient not taking: Reported on 08/10/2018), Disp: 90 tablet, Rfl: 3 .  gabapentin (NEURONTIN) 300 MG capsule, TAKE 1 CAPSULE(300 MG) BY MOUTH AT BEDTIME (Patient not taking: Reported on 08/10/2018), Disp: 30 capsule, Rfl: 2  No Known Allergies   Objective: BP (!) 150/100   Pulse 65   Temp 98.6 F (37 C) (Oral)   Resp 16   Ht 6' (1.829 m)   Wt 169 lb (76.7 kg)   SpO2 98%   BMI 22.92 kg/m   Wt Readings from Last 3 Encounters:  08/10/18 169 lb (76.7 kg)  09/05/17 161 lb (73 kg)  09/01/17 159 lb 3.2 oz (72.2 kg)   BP Readings from Last 3 Encounters:  08/10/18 (!) 150/100  09/05/17 132/74  09/01/17 140/80   General appearance: alert, no distress, WD/WN, hoarse voice Skin: several slightly raised, 4-5 mm diameter pinkish somewhat pearly lesions of chest, scattered macules otherwise , scattered small crusted AKs on forearms HEENT: normocephalic, sclerae anicteric, PERRLA, EOMi, nares patent, no discharge or erythema, pharynx normal Oral cavity: MMM Neck: supple, no lymphadenopathy, no thyromegaly, no masses, no bruits Heart: RRR, normal S1, S2, no murmurs Lungs: CTA bilaterally, no wheezes, rhonchi,  or rales Abdomen: +bs, soft, non tender, non distended, no masses, no hepatomegaly, no splenomegaly, no bruits Back: nontender, no obvious deformity Musculoskeletal: nontender on limited exam Extremities: no edema, no cyanosis, no clubbing Pulses: 1+ symmetric, upper and 1+ lower extremities, normal cap  refill.   right LE seems decreased worse than left pedal pulses Neurological: alert, oriented x 3, CN2-12 intact,  DTRs 2+ throughout, otherwise strength normal upper extremities and lower extremities, sensation normal throughout, DTRs 2+ throughout, no cerebellar signs, gait normal Psychiatric: normal affect, behavior normal, pleasant  GU: normal male, no hernia, no mass, no lad Rectal - deferred    Assessment: Encounter Diagnoses  Name Primary?  . Encounter for health maintenance examination in adult Yes  . Chronic hepatitis C without hepatic coma ([place])   . Coronary artery calcification seen on CT scan   . Aortic atherosclerosis ([place])   . Hypertension, unspecified type   . Chronic obstructive pulmonary disease, unspecified COPD type ([place])   . DDD (degenerative disc disease), lumbar   . Arm numbness   . Hypercholesterolemia   . Screening for prostate cancer   . Smoking   . Noncompliance   . Vaccine counseling   . Need for pneumococcal vaccination   . Neuropathy   . Chronic pain of both upper extremities   . Chronic pain of both lower extremities   . Skin lesion      Plan: Physical exam - discussed and counseled on healthy lifestyle, diet, exercise, preventative care, vaccinations, sick and well care, proper use of emergency dept and after hours care, and addressed their concerns.    Health screening: See your eye doctor yearly for routine vision care. See your dentist yearly for routine dental care including hygiene visits twice yearly.  Cancer screening Colonoscopy:  Reviewed colonoscopy on file that is up to date  Discussed PSA, prostate exam, and prostate cancer  screening risks/benefits.      Vaccinations: Advised yearly influenza vaccine Counseled on the pneumococcal vaccine.  Vaccine information sheet given.  Pneumococcal vaccine Prevnar 13 given after consent obtained.  Shingles vaccine:  I recommend you have a shingles vaccine to help prevent shingles or herpes zoster outbreak.   Please call your insurer to inquire about coverage for the Shingrix vaccine given in 2 doses.   Some insurers cover this vaccine after age 79, some cover this after age 36.  If your insurer covers this, then call to schedule appointment to have this vaccine here.  We will do lab titers today for hepatitis B and hepatitis A as he will need these updated if he is not immune  He is up-to-date on Tdap    Separate significant chronic issues discussed: Hepatitis C- I reviewed back over his labs from last year showing high levels of hepatitis C virus, I reviewed the CT abdomen pelvis that showed cirrhosis of the liver, no obvious liver tumor, AFP negative.  For whatever reason he decided not to go to hepatitis clinic last year.  I discussed the serious nature of hepatitis C chronic infection, possible complications including liver cancer, cirrhosis of liver issues, amongst the other any complications.  He is agreeable to referral back to hepatitis clinic, infectious disease.  We will refer  Hypertension-elevated blood pressure today.  He reports compliance.  Labs today  High cholesterol, atherosclerosis- he still smokes, needs to quit smoking has discussed.  He reports compliance with statin.  Labs today  Tobacco use disorder-counseled on the need to quit smoking  Several skin lesions noted today that need to be addressed-advise referral to dermatology  Neuropathy, chronic pain in arms and legs-discussed possible causes, long term alcohol use, hep C/liver related, DDD related.   Advised consult with infectious disease and get their input.   Potentially  f/u with orthopedist to  look further at his lumbar DDD issues.  Consider EMG/NCS.   Advised he cut back on alcohol or stop alcohol.   Coronary artery calcification - discussed findings on prior imagine, importance of smoking cessation, statin.     Duane Alexander was seen today for cpe.  Diagnoses and all orders for this visit:  Encounter for health maintenance examination in adult -     Comprehensive metabolic panel -     CBC with Differential/Platelet -     Lipid panel -     PSA -     Hepatitis B surface antibody,quantitative -     Hepatitis A antibody, total  Chronic hepatitis C without hepatic coma (HCC) -     Hepatitis B surface antibody,quantitative -     Hepatitis A antibody, total -     Ambulatory referral to Infectious Disease  Coronary artery calcification seen on CT scan  Aortic atherosclerosis (HCC)  Hypertension, unspecified type  Chronic obstructive pulmonary disease, unspecified COPD type (H[facility])  DDD (degenerative disc disease), lumbar  Arm numbness  Hypercholesterolemia  Screening for prostate cancer -     PSA  Smoking  Noncompliance  Vaccine counseling  Need for pneumococcal vaccination  Neuropathy  Chronic pain of both upper extremities  Chronic pain of both lower extremities  Skin lesion  Other orders -     Pneumococcal conjugate vaccine 13-valent    Follow-up pending labs, yearly for physical

## 2018-08-11 LAB — COMPREHENSIVE METABOLIC PANEL
ALT: 39 IU/L (ref 0–44)
AST: 52 IU/L — ABNORMAL HIGH (ref 0–40)
Albumin/Globulin Ratio: 1.3 (ref 1.2–2.2)
Albumin: 4.4 g/dL (ref 3.8–4.8)
Alkaline Phosphatase: 58 IU/L (ref 39–117)
BUN/Creatinine Ratio: 29 — ABNORMAL HIGH (ref 10–24)
BUN: 22 mg/dL (ref 8–27)
Bilirubin Total: 1 mg/dL (ref 0.0–1.2)
CO2: 23 mmol/L (ref 20–29)
Calcium: 10.1 mg/dL (ref 8.6–10.2)
Chloride: 99 mmol/L (ref 96–106)
Creatinine, Ser: 0.76 mg/dL (ref 0.76–1.27)
GFR calc Af Amer: 111 mL/min/{1.73_m2} (ref >59)
GFR calc non Af Amer: 96 mL/min/{1.73_m2} (ref >59)
Globulin, Total: 3.5 g/dL (ref 1.5–4.5)
Glucose: 97 mg/dL (ref 65–99)
Potassium: 5.6 mmol/L — ABNORMAL HIGH (ref 3.5–5.2)
Sodium: 139 mmol/L (ref 134–144)
Total Protein: 7.9 g/dL (ref 6.0–8.5)

## 2018-08-11 LAB — CBC WITH DIFFERENTIAL/PLATELET
Basophils Absolute: 0.1 10*3/uL (ref 0.0–0.2)
Basos: 1 %
EOS (ABSOLUTE): 0.1 10*3/uL (ref 0.0–0.4)
Eos: 2 %
Hematocrit: 43.9 % (ref 37.5–51.0)
Hemoglobin: 14.7 g/dL (ref 13.0–17.7)
Immature Grans (Abs): 0 10*3/uL (ref 0.0–0.1)
Immature Granulocytes: 0 %
Lymphocytes Absolute: 4.5 10*3/uL — ABNORMAL HIGH (ref 0.7–3.1)
Lymphs: 59 %
MCH: 33.7 pg — ABNORMAL HIGH (ref 26.6–33.0)
MCHC: 33.5 g/dL (ref 31.5–35.7)
MCV: 101 fL — ABNORMAL HIGH (ref 79–97)
Monocytes Absolute: 0.7 10*3/uL (ref 0.1–0.9)
Monocytes: 9 %
Neutrophils Absolute: 2.2 10*3/uL (ref 1.4–7.0)
Neutrophils: 29 %
Platelets: 252 10*3/uL (ref 150–450)
RBC: 4.36 x10E6/uL (ref 4.14–5.80)
RDW: 12 % (ref 11.6–15.4)
WBC: 7.5 10*3/uL (ref 3.4–10.8)

## 2018-08-11 LAB — PSA: Prostate Specific Ag, Serum: 0.6 ng/mL (ref 0.0–4.0)

## 2018-08-11 LAB — LIPID PANEL
Chol/HDL Ratio: 2.1 ratio (ref 0.0–5.0)
Cholesterol, Total: 188 mg/dL (ref 100–199)
HDL: 89 mg/dL (ref >39)
LDL Calculated: 88 mg/dL (ref 0–99)
Triglycerides: 54 mg/dL (ref 0–149)
VLDL Cholesterol Cal: 11 mg/dL (ref 5–40)

## 2018-08-11 LAB — HEPATITIS A ANTIBODY, TOTAL: hep A Total Ab: POSITIVE — AB

## 2018-08-11 LAB — HEPATITIS B SURFACE ANTIBODY, QUANTITATIVE: Hepatitis B Surf Ab Quant: <3.1 m[IU]/mL — ABNORMAL LOW (ref >9.9)

## 2018-09-07 ENCOUNTER — Other Ambulatory Visit: Payer: Self-pay | Admitting: Medical

## 2018-09-11 ENCOUNTER — Other Ambulatory Visit: Payer: Self-pay | Admitting: Medical

## 2018-10-12 ENCOUNTER — Other Ambulatory Visit: Payer: Self-pay | Admitting: Medical

## 2018-10-15 ENCOUNTER — Other Ambulatory Visit: Payer: Self-pay | Admitting: Medical

## 2018-11-29 ENCOUNTER — Other Ambulatory Visit: Payer: Self-pay | Admitting: Medical

## 2019-01-11 ENCOUNTER — Other Ambulatory Visit: Payer: Self-pay | Admitting: Medical

## 2019-01-23 ENCOUNTER — Encounter: Payer: Self-pay | Admitting: Medical

## 2019-01-23 ENCOUNTER — Other Ambulatory Visit: Payer: Self-pay

## 2019-01-23 ENCOUNTER — Ambulatory Visit: Payer: BC Managed Care – PPO | Admitting: Medical

## 2019-01-23 VITALS — BP 120/82 | HR 74 | Temp 98.2°F | Ht 72.0 in | Wt 163.4 lb

## 2019-01-23 DIAGNOSIS — R748 Abnormal levels of other serum enzymes: Secondary | ICD-10-CM

## 2019-01-23 DIAGNOSIS — I10 Essential (primary) hypertension: Secondary | ICD-10-CM

## 2019-01-23 DIAGNOSIS — K7460 Unspecified cirrhosis of liver: Secondary | ICD-10-CM | POA: Diagnosis not present

## 2019-01-23 DIAGNOSIS — K2900 Acute gastritis without bleeding: Secondary | ICD-10-CM

## 2019-01-23 DIAGNOSIS — R109 Unspecified abdominal pain: Secondary | ICD-10-CM | POA: Diagnosis not present

## 2019-01-23 DIAGNOSIS — B182 Chronic viral hepatitis C: Secondary | ICD-10-CM

## 2019-01-23 DIAGNOSIS — E785 Hyperlipidemia, unspecified: Secondary | ICD-10-CM

## 2019-01-23 MED ORDER — OMEPRAZOLE 40 MG PO CPDR: 40.0000 mg | DELAYED_RELEASE_CAPSULE | Freq: Every day | ORAL | 1 refills | Status: DC

## 2019-01-23 MED ORDER — SUCRALFATE 1 GM/10ML PO SUSP: 1.0000 g | Freq: Three times a day (TID) | ORAL | 0 refills | Status: DC

## 2019-01-23 NOTE — Patient Instructions (Signed)
Begin Omeprazole acid blocker twice daily x 1 week, then once daily.   Try to take this in the morning 30 minutes prior to breakfast  Begin Sucralfate liquid 30 minutes prior to a meal 3 times daily to coat the stomach.  Avoid acidic and spicy foods, avoid alcohol, avoid anti-inflammatories as these can worse acid, gastritis and inflammation in the gastric tract.

## 2019-01-23 NOTE — Progress Notes (Addendum)
Subjective:  Duane Alexander is a 64 y.o. male who presents for Chief Complaint  Patient presents with  . Abdominal Pain    upper abdominal pain      Here for burning pain in upper abdomen x 4-5 days.   Denies belching, nausea, vomiting, diarrhea, constipation.  No blood in stool,  No chest pain, no SOB.   No urinary concern.  He notes he stopped alcohol 1st of the year.  Was drinking a few beers nightly for a long time.  He would drink beer mixed with tomato juice and clam juice.   No NSAID use, doesn't eat a lot of spicy foods.   No other aggravating or relieving factors.    No other c/o.  Past Medical History:  Diagnosis Date  . Aortic atherosclerosis ([place])   . COPD (chronic obstructive pulmonary disease) ([place])    per CT findings 2019  . Family history of lung cancer   . Hepatitis C    referred to hepatitis clinic 2019 but he never went  . Hyperlipidemia   . Hypertension   . Smoker   . Wears glasses    Current Outpatient Medications on File Prior to Visit  Medication Sig Dispense Refill  . lisinopril (ZESTRIL) 20 MG tablet TAKE 1 TABLET BY MOUTH DAILY 30 tablet 0  . lovastatin (MEVACOR) 20 MG tablet TAKE 1 TABLET BY MOUTH AT BEDTIME 30 tablet 0  . Multiple Vitamin (MULTIVITAMIN) tablet Take 1 tablet by mouth daily.    [name] aspirin EC 81 MG tablet Take 1 tablet (81 mg total) by mouth daily. (Patient not taking: Reported on 08/10/2018) 90 tablet 3  . gabapentin (NEURONTIN) 300 MG capsule TAKE 1 CAPSULE(300 MG) BY MOUTH AT BEDTIME (Patient not taking: Reported on 08/10/2018) 30 capsule 2   No current facility-administered medications on file prior to visit.   The following portions of the patient's history were reviewed and updated as appropriate: allergies, current medications, past family history, past medical history, past social history, past surgical history and problem list.  ROS Otherwise as in subjective above    Objective: BP 120/82   Pulse 74   Temp 98.2 F (36.8 C)    Ht 6' (1.829 m)   Wt 163 lb 6.4 oz (74.1 kg)   SpO2 97%   BMI 22.16 kg/m   General appearance: alert, no distress, well developed, well nourished Neck: supple, no lymphadenopathy, no thyromegaly, no masses Heart: RRR, normal S1, S2, no murmurs Lungs: CTA bilaterally, no wheezes, rhonchi, or rales Abdomen: +bs, soft, tender across upper abdomen, +hepatomegaly, non-distended, no masses, no splenomegaly Pulses: 2+ radial pulses, 2+ pedal pulses, normal cap refill Ext: no edema    Assessment: Encounter Diagnoses  Name Primary?  . Abdominal pain, unspecified abdominal location Yes  . Chronic hepatitis C without hepatic coma ([place])   . Acute gastritis without hemorrhage, unspecified gastritis type   . Cirrhosis of liver without ascites, unspecified hepatic cirrhosis type ([place])   . Hypertension, unspecified type   . Hyperlipidemia, unspecified hyperlipidemia type   . Abnormal serum level of lipase      Plan: Abdominal pain, gastritis - begin medications below, labs today. Avoid GERD triggers.  Avoid ETOH.  Chronic hep C - despite 2 prior referral attempts and prior discussed of the serious nature of + hep C, he never went to hepatitis clinic.    We will again refer to hepatitis/infectious disease clinic for treatment recommendations.   We discussed the need for abdominal for  surveillance given risk of liver cancer.   He will consider.   HTN - c/t current medication.  Hyperlipidemia - he c/t on statin due to atherosclerosis.  Benefits thought to outweighs risks with liver disease.   Addendum: After reviewing labs from yesterday, lipase is over 1000!   His pain and symptoms are out of proportion to the level of lipase.   given his underlying cirrhosis, current pain, and risk factors for hepatocellular malignancy, we will pursue CT abdomen and pelvis.     Duane Alexander was seen today for abdominal pain.  Diagnoses and all orders for this visit:  Abdominal pain, unspecified abdominal  location -     Lipase -     Comprehensive metabolic panel  Chronic hepatitis C without hepatic coma (HCC) -     Lipase -     Comprehensive metabolic panel  Acute gastritis without hemorrhage, unspecified gastritis type -     Lipase -     Comprehensive metabolic panel  Cirrhosis of liver without ascites, unspecified hepatic cirrhosis type (HCC)  Hypertension, unspecified type  Hyperlipidemia, unspecified hyperlipidemia type  Abnormal serum level of lipase  Other orders -     sucralfate (CARAFATE) 1 GM/10ML suspension; Take 10 mLs (1 g total) by mouth 4 (four) times daily -  with meals and at bedtime. -     omeprazole (PRILOSEC) 40 MG capsule; Take 1 capsule (40 mg total) by mouth daily.   Follow up: pending labs

## 2019-01-24 ENCOUNTER — Telehealth: Payer: Self-pay | Admitting: Medical

## 2019-01-24 ENCOUNTER — Other Ambulatory Visit: Payer: Self-pay | Admitting: Medical

## 2019-01-24 DIAGNOSIS — R748 Abnormal levels of other serum enzymes: Secondary | ICD-10-CM | POA: Insufficient documentation

## 2019-01-24 DIAGNOSIS — R109 Unspecified abdominal pain: Secondary | ICD-10-CM

## 2019-01-24 LAB — COMPREHENSIVE METABOLIC PANEL
ALT: 42 IU/L (ref 0–44)
AST: 50 IU/L — ABNORMAL HIGH (ref 0–40)
Albumin/Globulin Ratio: 1.2 (ref 1.2–2.2)
Albumin: 4.1 g/dL (ref 3.8–4.8)
Alkaline Phosphatase: 67 IU/L (ref 39–117)
BUN/Creatinine Ratio: 17 (ref 10–24)
BUN: 15 mg/dL (ref 8–27)
Bilirubin Total: 0.4 mg/dL (ref 0.0–1.2)
CO2: 24 mmol/L (ref 20–29)
Calcium: 9.6 mg/dL (ref 8.6–10.2)
Chloride: 103 mmol/L (ref 96–106)
Creatinine, Ser: 0.87 mg/dL (ref 0.76–1.27)
GFR calc Af Amer: 105 mL/min/{1.73_m2} (ref >59)
GFR calc non Af Amer: 91 mL/min/{1.73_m2} (ref >59)
Globulin, Total: 3.5 g/dL (ref 1.5–4.5)
Glucose: 122 mg/dL — ABNORMAL HIGH (ref 65–99)
Potassium: 4.4 mmol/L (ref 3.5–5.2)
Sodium: 139 mmol/L (ref 134–144)
Total Protein: 7.6 g/dL (ref 6.0–8.5)

## 2019-01-24 LAB — LIPASE: Lipase: 1163 U/L — ABNORMAL HIGH (ref 13–78)

## 2019-01-24 NOTE — Telephone Encounter (Signed)
Regarding my CT request....   If prior auth needed for imaging such as CT, please use the following info.  Use Provider Portal if possible.  If you don't have an account as a CMA to the portal, set up account at [phone]  Fax cover sheet and my office note with clinical info regarding imaging request to [phone]  Fax cover should include patient, DOB, member ID, health plan name  Make sure Audelia Acton has underlined pertinent exam and clinical info to support imaging request   Call the prior auth line [phone]  to check status of request/authorization or use portal to check status.

## 2019-01-24 NOTE — Addendum Note (Signed)
Addended by: Carlena Hurl on: 01/24/2019 02:50 PM   Modules accepted: Level of Service

## 2019-01-29 NOTE — Telephone Encounter (Signed)
I have placed notes on your desk. Please underline information so I can fax for patient to have ct done.

## 2019-01-30 ENCOUNTER — Other Ambulatory Visit: Payer: Self-pay | Admitting: Medical

## 2019-01-30 NOTE — Telephone Encounter (Signed)
Information has been faxed  

## 2019-01-30 NOTE — Telephone Encounter (Signed)
I checked and circled some things.  Fax over and lets get CT approved

## 2019-02-07 ENCOUNTER — Other Ambulatory Visit: Payer: Self-pay | Admitting: Medical

## 2019-02-07 ENCOUNTER — Telehealth: Payer: Self-pay | Admitting: Medical

## 2019-02-07 ENCOUNTER — Ambulatory Visit
Admission: RE | Admit: 2019-02-07 | Discharge: 2019-02-07 | Disposition: A | Discharge status: Home / Self Care | Payer: BLUE CROSS/BLUE SHIELD | Source: Ambulatory Visit | Attending: Medical | Admitting: Medical

## 2019-02-07 DIAGNOSIS — K7460 Unspecified cirrhosis of liver: Secondary | ICD-10-CM | POA: Diagnosis not present

## 2019-02-07 DIAGNOSIS — R109 Unspecified abdominal pain: Secondary | ICD-10-CM

## 2019-02-07 DIAGNOSIS — R748 Abnormal levels of other serum enzymes: Secondary | ICD-10-CM

## 2019-02-07 DIAGNOSIS — C259 Malignant neoplasm of pancreas, unspecified: Secondary | ICD-10-CM

## 2019-02-07 MED ORDER — IOPAMIDOL (ISOVUE-300) INJECTION 61%: 100.0000 mL | Freq: Once | INTRAVENOUS | Status: DC | PRN

## 2019-02-07 NOTE — Telephone Encounter (Signed)
Referral has been placed. 

## 2019-02-07 NOTE — Telephone Encounter (Signed)
Go ahead and get referral going for oncology as well, ASAP, likely pancreatic cancer.  He is not aware of diagnosis yet.

## 2019-02-08 ENCOUNTER — Encounter: Payer: Self-pay | Admitting: Medical

## 2019-02-08 ENCOUNTER — Other Ambulatory Visit: Payer: Self-pay

## 2019-02-08 ENCOUNTER — Ambulatory Visit (INDEPENDENT_AMBULATORY_CARE_PROVIDER_SITE_OTHER): Payer: BC Managed Care – PPO | Admitting: Medical

## 2019-02-08 VITALS — BP 150/90 | HR 63 | Temp 97.8°F | Ht 72.0 in | Wt 164.6 lb

## 2019-02-08 DIAGNOSIS — K739 Chronic hepatitis, unspecified: Secondary | ICD-10-CM | POA: Diagnosis not present

## 2019-02-08 DIAGNOSIS — K8689 Other specified diseases of pancreas: Secondary | ICD-10-CM

## 2019-02-08 DIAGNOSIS — F17200 Nicotine dependence, unspecified, uncomplicated: Secondary | ICD-10-CM

## 2019-02-08 DIAGNOSIS — J449 Chronic obstructive pulmonary disease, unspecified: Secondary | ICD-10-CM

## 2019-02-08 DIAGNOSIS — B182 Chronic viral hepatitis C: Secondary | ICD-10-CM

## 2019-02-08 DIAGNOSIS — R1013 Epigastric pain: Secondary | ICD-10-CM | POA: Diagnosis not present

## 2019-02-08 DIAGNOSIS — E785 Hyperlipidemia, unspecified: Secondary | ICD-10-CM

## 2019-02-08 DIAGNOSIS — I700 Atherosclerosis of aorta: Secondary | ICD-10-CM

## 2019-02-08 NOTE — Progress Notes (Signed)
Subjective:  Duane Alexander is a 64 y.o. male who presents for Chief Complaint  Patient presents with  . Follow-up    ct scan      Here to discuss CT scan.     Duane Alexander has a history of untreated hepatitis C, hyperlipidemia, hypertension, COPD, smoker, aortic atherosclerosis.  I saw him 01/23/2019 for burning pain in upper abdomen x 4-5 days.   Since last visit that pain completley resolved.  He feels fine today.  At his last visit his lipase was quite elevated which prompted further evaluation with CT scan.  He also has not yet established with hepatitis clinic/infectious disease for chronic hepatitis C.  He was referred in 2019 but never went to appointment, and advised to go since then in 2020.   He agreed to referral again just recently  No other c/o.  Past Medical History:  Diagnosis Date  . Aortic atherosclerosis ([place])   . COPD (chronic obstructive pulmonary disease) ([place])    per CT findings 2019  . Family history of lung cancer   . Hepatitis C    referred to hepatitis clinic 2019 but he never went  . Hyperlipidemia   . Hypertension   . Smoker   . Wears glasses    Current Outpatient Medications on File Prior to Visit  Medication Sig Dispense Refill  . lisinopril (ZESTRIL) 20 MG tablet TAKE 1 TABLET BY MOUTH DAILY 30 tablet 0  . lovastatin (MEVACOR) 20 MG tablet TAKE 1 TABLET BY MOUTH AT BEDTIME 30 tablet 0  . Multiple Vitamin (MULTIVITAMIN) tablet Take 1 tablet by mouth daily.    . sucralfate (CARAFATE) 1 GM/10ML suspension TAKE 10 MLS(1 GRAM) BY MOUTH FOUR TIMES DAILY WITH MEALS AND AT BEDTIME 420 mL 0  . aspirin EC 81 MG tablet Take 1 tablet (81 mg total) by mouth daily. (Patient not taking: Reported on 02/08/2019) 90 tablet 3  . gabapentin (NEURONTIN) 300 MG capsule TAKE 1 CAPSULE(300 MG) BY MOUTH AT BEDTIME (Patient not taking: Reported on 02/08/2019) 30 capsule 2  . omeprazole (PRILOSEC) 40 MG capsule Take 1 capsule (40 mg total) by mouth daily. (Patient not taking:  Reported on 02/08/2019) 30 capsule 1   No current facility-administered medications on file prior to visit.   The following portions of the patient's history were reviewed and updated as appropriate: allergies, current medications, past family history, past medical history, past social history, past surgical history and problem list.  ROS Otherwise as in subjective above    Objective: BP (!) 150/90   Pulse 63   Temp 97.8 F (36.6 C)   Ht 6' (1.829 m)   Wt 164 lb 9.6 oz (74.7 kg)   SpO2 94%   BMI 22.32 kg/m   General appearance: alert, no distress, well developed, well nourished Neck: supple, no lymphadenopathy, no thyromegaly, no masses Heart: RRR, normal S1, S2, no murmurs Lungs: CTA bilaterally, no wheezes, rhonchi, or rales Abdomen: +bs, soft,, +hepatomegaly, nontender, non-distended, no masses, no splenomegaly Pulses: 2+ radial pulses, 2+ pedal pulses, normal cap refill Ext: no edema    Assessment: Encounter Diagnoses  Name Primary?  . Pancreatic mass Yes  . Epigastric pain   . Chronic hepatitis ([place])   . Aortic atherosclerosis ([place])   . Chronic hepatitis C without hepatic coma ([place])   . Chronic obstructive pulmonary disease, unspecified COPD type ([place])   . Smoking   . Hyperlipidemia, unspecified hyperlipidemia type      Plan: We discussed his recent CT  scan that unfortunately shows a pancreatic mass.  This is new from prior scan 2019.  We discussed the findings, discussed the need for biopsy and consult with gastroenterology and oncology.  He still needs treatment for hepatitis C.  We already got an appointment scheduled in 3 days to meet with oncology.  Oncology has already sent word to gastroenterology, Dr. Dereck Leep for possible consult in biopsy.  Patient lives in [place].  He will continue his current medications.   Follow-up pending GI and oncology consults

## 2019-02-08 NOTE — Patient Instructions (Signed)
We saw you recently for burning pain in the upper abdomen, and we check some blood work.  Given your symptoms and lab results I ordered a scan because I was concerned about the pancreas.  Your recent liver tests are elevated, your lipase pancreas enzyme was quite high.  Your CT scan shows a mass at the head of the pancreas causing swelling of the pancreatic duct.  This is concerning for a tumor versus inflammation of the pancreas.  The plan now is to refer you to 2 specialists, a gastroenterologist and an oncologist.  The gastroenterologist role is to potentially go in with a camera and get a biopsy.  This is a procedure called ERCP unless they decide some other approach.  I also want to go ahead and have you see oncology as this could be a tumor.  We need to make sure we get you hooked in with the right doctors to be able to address this issue appropriately and quickly.  We still need to address the chronic hepatitis infection issue as well as cirrhosis since you have never had treatment for the hepatitis.  So please discuss this with the gastroenterologist.  We have an appointment scheduled already with oncology.  We are working on the gastroenterology appointment time.  Oncology Appointment with Dr. Truitt Merle Monday 02/11/2019 at 3pm.  Arrive at least 15 minutes prior.   [address], [place], [place] [address] [phone]   We are working to get you an appointment with Dr. Laural Golden, gastroenterology in [place] at [facility].

## 2019-02-11 ENCOUNTER — Inpatient Hospital Stay: Payer: BC Managed Care – PPO | Admitting: Hematology

## 2019-02-11 ENCOUNTER — Telehealth (INDEPENDENT_AMBULATORY_CARE_PROVIDER_SITE_OTHER): Payer: Self-pay | Admitting: Internal Medicine

## 2019-02-11 ENCOUNTER — Telehealth: Payer: Self-pay

## 2019-02-11 NOTE — Telephone Encounter (Signed)
Dr. Truitt Merle contacted me requesting pancreatic EUS with FNA for recently discovered pancreatic head mass.  She called because I had seen patient for screening colonoscopy in January, 2019. I told her I do not do EUS as an generally get help from Dr. Oretha Caprice. Therefore I contacted Dr. Ardis Hughs on her behalf to request the study.

## 2019-02-11 NOTE — Telephone Encounter (Signed)
I spoke with patient regarding his scheduled appointment today with Dr. Burr Medico at 3:00 pm.  He states he called and let someone know that his sister had a flat tire and he wouldn't be able to make it.  He lives in [place], [place] and would like to be seen at [facility] which is much closer to where he lives.  Scheduling was notified.

## 2019-02-12 ENCOUNTER — Telehealth: Payer: Self-pay

## 2019-02-12 ENCOUNTER — Other Ambulatory Visit: Payer: Self-pay

## 2019-02-12 ENCOUNTER — Encounter (HOSPITAL_COMMUNITY): Payer: Self-pay | Admitting: *Deleted

## 2019-02-12 DIAGNOSIS — K8689 Other specified diseases of pancreas: Secondary | ICD-10-CM

## 2019-02-12 NOTE — Progress Notes (Signed)
Oncology Navigator Note:  Patient was referred to our office by his primary for pancreatic mass.  I called patient today to introduce myself and provide information in how I will be involved with his care. He denies any needs at this time. I provided information on their first visit and what to expect.  I made sure patient was aware of appointment time and directions to the cancer center.  My phone number was given so that he can call me with any questions or concerns.  He voices appreciation and understanding.

## 2019-02-12 NOTE — Telephone Encounter (Signed)
EUS scheduled for 3/8 at [facility] with Dr Rush [facility] COVID testing on 3/4 at 945 am.  Information sent via My Chart.  Left message on machine to call back

## 2019-02-12 NOTE — Telephone Encounter (Signed)
Duane Banister, MD  Rogene Houston, MD; Timothy Lasso, RN; Mansouraty, Telford Nab., MD  Bernadene Person, thanks. We'll take care of this    Duane Alexander,  HE needs first available EUS with myself or Gabe for pancreatic neck mass. Thanks

## 2019-02-13 ENCOUNTER — Encounter (HOSPITAL_COMMUNITY): Payer: Self-pay | Admitting: Surgery

## 2019-02-13 ENCOUNTER — Inpatient Hospital Stay (HOSPITAL_COMMUNITY): Payer: BC Managed Care – PPO | Attending: Hematology | Admitting: Hematology

## 2019-02-13 ENCOUNTER — Encounter (HOSPITAL_COMMUNITY): Payer: Self-pay | Admitting: Hematology

## 2019-02-13 ENCOUNTER — Other Ambulatory Visit: Payer: Self-pay

## 2019-02-13 ENCOUNTER — Inpatient Hospital Stay (HOSPITAL_COMMUNITY): Payer: BC Managed Care – PPO

## 2019-02-13 VITALS — BP 167/99 | HR 99 | Temp 97.3°F | Resp 18 | Ht 70.0 in | Wt 160.1 lb

## 2019-02-13 DIAGNOSIS — K8689 Other specified diseases of pancreas: Secondary | ICD-10-CM | POA: Diagnosis not present

## 2019-02-13 DIAGNOSIS — Z7289 Other problems related to lifestyle: Secondary | ICD-10-CM | POA: Diagnosis not present

## 2019-02-13 DIAGNOSIS — Z806 Family history of leukemia: Secondary | ICD-10-CM | POA: Insufficient documentation

## 2019-02-13 DIAGNOSIS — Z808 Family history of malignant neoplasm of other organs or systems: Secondary | ICD-10-CM

## 2019-02-13 DIAGNOSIS — Z801 Family history of malignant neoplasm of trachea, bronchus and lung: Secondary | ICD-10-CM

## 2019-02-13 DIAGNOSIS — D378 Neoplasm of uncertain behavior of other specified digestive organs: Secondary | ICD-10-CM | POA: Diagnosis not present

## 2019-02-13 DIAGNOSIS — F17210 Nicotine dependence, cigarettes, uncomplicated: Secondary | ICD-10-CM | POA: Insufficient documentation

## 2019-02-13 LAB — HEPATIC FUNCTION PANEL
ALT: 42 U/L (ref 0–44)
AST: 44 U/L — ABNORMAL HIGH (ref 15–41)
Albumin: 4 g/dL (ref 3.5–5.0)
Alkaline Phosphatase: 52 U/L (ref 38–126)
Bilirubin, Direct: 0.3 mg/dL — ABNORMAL HIGH (ref 0.0–0.2)
Indirect Bilirubin: 1.1 mg/dL — ABNORMAL HIGH (ref 0.3–0.9)
Total Bilirubin: 1.4 mg/dL — ABNORMAL HIGH (ref 0.3–1.2)
Total Protein: 8.6 g/dL — ABNORMAL HIGH (ref 6.5–8.1)

## 2019-02-13 LAB — AMYLASE: Amylase: 186 U/L — ABNORMAL HIGH (ref 28–100)

## 2019-02-13 NOTE — Assessment & Plan Note (Addendum)
1.  Pancreatic head mass: -Patient reported epigastric pain for 2 weeks. -CT scan of the abdomen and pelvis without contrast on 02/07/2019 showed new pancreatic ductal dilatation with in the body and the tail of the pancreas with new hypodense infiltrative region in the neck of the pancreas.  Small lymph nodes within the gastrohepatic ligament as well as adjacent to the pancreatic body.  Portacaval lymph node measures 10 mm.  Mild nodularity of the liver surface with enlarged left liver compatible with cirrhosis. -Patient was evaluated by PMD and was given Carafate and omeprazole.  He reports that his pain subsided yesterday. -Denies any fevers, night sweats or weight loss in the past few months.  He has good appetite. -He drank 3 to 4 cans of beer daily and quit about 2 months ago.  He has a 50-pack-year smoking history. -I have reviewed the images of the CT scan with the patient and his sister.  I have recommended CT of the abdomen with contrast pancreatic protocol. -We have also recommended LFTs, CA 19-9 along with routine labs. -We will make a referral to GI for EUS and biopsy.  We will also order a PET CT scan for further staging. -If adenocarcinoma is confirmed, we will also refer him for germline mutation testing.  2.  Family history: -Brother died of lung cancer.  Father died of throat cancer. -Sister had ovarian cancer.  Nephew died of acute leukemia.  Maternal uncle had lung cancer.

## 2019-02-13 NOTE — Progress Notes (Signed)
[facility] NOTE  Patient Care Team: Rakes, Connye Burkitt, FNP as PCP - General (Family Medicine) Derek Jack, MD as Consulting Physician (Hematology and Oncology)  CHIEF COMPLAINTS/PURPOSE OF CONSULTATION:  Pancreatic mass.  HISTORY OF PRESENTING ILLNESS:  Duane Alexander 64 y.o. male is seen in consultation today for further work-up and management of newly diagnosed pancreatic mass.  He reported 2-week history of epigastric pain.  CT scan of the abdomen and pelvis without contrast on 02/07/2019 showed new pancreatic ductal dilatation within the body and the tail of the pancreas with new hypodense infiltrative region in the neck of the pancreas.  Small lymph nodes within the gastrohepatic ligament as well as adjacent to the pancreatic body.  Portacaval lymph node measures 10 mm.  Mild nodularity of the liver surface with enlarged left liver compatible with cirrhosis.  CMP on 01/22/2018 showed total bilirubin of 0.4 with AST elevated at 50.  Lipase was elevated at 1163.  Alk phos was 67.  Creatinine was normal.  He was evaluated by his PMD and was given Carafate and omeprazole.  Patient reported that his pain improved yesterday.  He was told to have hepatitis C recently.  He denies any prior history of blood transfusions.  Denies any fevers, night sweats or weight loss in the last 6 months.  Denies any loss of appetite.  Lives at home with his roommate.  He is seen today with his sister.  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  He is a current active smoker, 1 pack/day for 50 years.  He used to drink 3 to 4 cans of beer per day.  He quit drinking 2 months ago.  Family history significant for brother who died of lung cancer and father died of throat cancer.  Sister had ovarian cancer.  Nephew had acute leukemia.  Maternal uncle had lung cancer.  He reported some numbness in the hands and feet since his neck  surgery a year ago.  Appetite is 100%.  Energy levels are 50%.  MEDICAL HISTORY:  Past Medical History:  Diagnosis Date  . Aortic atherosclerosis ([place])   . COPD (chronic obstructive pulmonary disease) ([place])    per CT findings 2019  . Family history of lung cancer   . Hepatitis C    referred to hepatitis clinic 2019 but he never went  . Hyperlipidemia   . Hypertension   . Smoker   . Wears glasses     SURGICAL HISTORY: Past Surgical History:  Procedure Laterality Date  . ANTERIOR CERVICAL DECOMP/DISCECTOMY FUSION  12/2017   Dr. Consuella Lose  . COLONOSCOPY N/A 01/19/2017   Procedure: COLONOSCOPY;  Surgeon: Rogene Houston, MD;  Location[facility];  Service: Endoscopy;  Laterality: N/A;  930-moved to 9:45 per Lelon Frohlich  . POLYPECTOMY  01/19/2017   Procedure: POLYPECTOMY;  Surgeon: Rogene Houston, MD;  Location: [facility];  Service: Endoscopy;;  splenic flexure  . Right eye surgery     as a child-removed muscle from leg and put in upper eye lid    SOCIAL HISTORY: Social History   Socioeconomic History  . Marital status: Widowed    Spouse name: Not on file  . Number of children: Not on file  . Years of education: Not on file  . Highest education level: Not on file  Occupational History    Employer: [employer]  Tobacco Use  . Smoking status: Current  Every Day Smoker    Packs/day: 1.00    Years: 45.00    Pack years: 45.00    Types: Cigarettes  . Smokeless tobacco: Never Used  Substance and Sexual Activity  . Alcohol use: Not Currently  . Drug use: No  . Sexual activity: Not Currently  Other Topics Concern  . Not on file  Social History Narrative  . Not on file   Social Determinants of Health   Financial Resource Strain: Low Risk   . Difficulty of Paying Living Expenses: Not hard at all  Food Insecurity: No Food Insecurity  . Worried About Charity fundraiser in the Last Year: Never true  . Ran Out of Food in the Last Year: Never true   Transportation Needs: No Transportation Needs  . Lack of Transportation (Medical): No  . Lack of Transportation (Non-Medical): No  Physical Activity: Inactive  . Days of Exercise per Week: 0 days  . Minutes of Exercise per Session: 0 min  Stress: Stress Concern Present  . Feeling of Stress : Rather much  Social Connections: Moderately Isolated  . Frequency of Communication with Friends and Family: Three times a week  . Frequency of Social Gatherings with Friends and Family: Three times a week  . Attends Religious Services: Never  . Active Member of Clubs or Organizations: No  . Attends Archivist Meetings: Never  . Marital Status: Widowed  Intimate Partner Violence: Not At Risk  . Fear of Current or Ex-Partner: No  . Emotionally Abused: No  . Physically Abused: No  . Sexually Abused: No    FAMILY HISTORY: Family History  Problem Relation Age of Onset  . Cancer Mother   . Other Father        died in his 23s of stomach issues?  . Cancer Brother 52       lung  . Other Sister        GI related death  . Heart disease Neg Hx   . Hypertension Neg Hx   . Stroke Neg Hx   . Colon cancer Neg Hx     ALLERGIES:  has No Known Allergies.  MEDICATIONS:  Current Outpatient Medications  Medication Sig Dispense Refill  . lisinopril (ZESTRIL) 20 MG tablet TAKE 1 TABLET BY MOUTH DAILY 30 tablet 0  . lovastatin (MEVACOR) 20 MG tablet TAKE 1 TABLET BY MOUTH AT BEDTIME 30 tablet 0  . Multiple Vitamin (MULTIVITAMIN) tablet Take 1 tablet by mouth daily.     No current facility-administered medications for this visit.    REVIEW OF SYSTEMS:   Constitutional: Denies fevers, chills or abnormal night sweats.  Positive for sleep problems. Eyes: Denies blurriness of vision, double vision or watery eyes Ears, nose, mouth, throat, and face: Denies mucositis or sore throat Respiratory: Denies cough, dyspnea or wheezes Cardiovascular: Denies palpitation, chest discomfort or lower  extremity swelling Gastrointestinal:  Denies nausea, heartburn or change in bowel habits Skin: Denies abnormal skin rashes Lymphatics: Denies new lymphadenopathy or easy bruising Neurological: Numbness in the hands and feet from neck surgery. Chronic pain in the lower back present. Behavioral/Psych: Mood is stable, no new changes  All other systems were reviewed with the patient and are negative.  PHYSICAL EXAMINATION: ECOG PERFORMANCE STATUS: 0 - Asymptomatic  Vitals:   02/13/19 1331  BP: (!) 167/99  Pulse: 99  Resp: 18  Temp: (!) 97.3 F (36.3 C)  SpO2: 100%   Filed Weights   02/13/19 1331  Weight: 160 lb  1.6 oz (72.6 kg)    GENERAL:alert, no distress and comfortable SKIN: skin color, texture, turgor are normal, no rashes or significant lesions EYES: normal, conjunctiva are pink and non-injected, sclera clear OROPHARYNX:no exudate, no erythema and lips, buccal mucosa, and tongue normal  NECK: supple, thyroid normal size, non-tender, without nodularity LYMPH:  no palpable lymphadenopathy in the cervical, axillary or inguinal LUNGS: clear to auscultation and percussion with normal breathing effort HEART: regular rate & rhythm and no murmurs and no lower extremity edema ABDOMEN:abdomen soft, non-tender and normal bowel sounds Musculoskeletal:no cyanosis of digits and no clubbing  PSYCH: alert & oriented x 3 with fluent speech NEURO: no focal motor/sensory deficits  LABORATORY DATA:  I have reviewed the data as listed Lab Results  Component Value Date   WBC 7.5 08/10/2018   HGB 14.7 08/10/2018   HCT 43.9 08/10/2018   MCV 101 (H) 08/10/2018   PLT 252 08/10/2018     Chemistry      Component Value Date/Time   NA 139 01/23/2019 1538   K 4.4 01/23/2019 1538   CL 103 01/23/2019 1538   CO2 24 01/23/2019 1538   BUN 15 01/23/2019 1538   CREATININE 0.87 01/23/2019 1538   CREATININE 0.84 05/17/2016 0712      Component Value Date/Time   CALCIUM 9.6 01/23/2019 1538    ALKPHOS 67 01/23/2019 1538   AST 50 (H) 01/23/2019 1538   ALT 42 01/23/2019 1538   BILITOT 0.4 01/23/2019 1538       RADIOGRAPHIC STUDIES: I have personally reviewed the radiological images as listed and agreed with the findings in the report. CT ABDOMEN PELVIS WO CONTRAST  Result Date: 02/07/2019 CLINICAL DATA:  64 year old male with a history of abdominal pain EXAM: CT ABDOMEN AND PELVIS WITHOUT CONTRAST TECHNIQUE: Multidetector CT imaging of the abdomen and pelvis was performed following the standard protocol without IV contrast. COMPARISON:  09/19/2017 FINDINGS: Lower chest: No acute finding of the lower chest. Hepatobiliary: Mild nodularity of the liver surface with enlarged left liver compatible with cirrhosis. Differential attenuation of the material within the gallbladder with hyperdense material layered dependently compatible with biliary sludge/microlithiasis. No inflammatory changes. Pancreas: There is new pancreatic ductal dilatation within the body and the tail the pancreas. There appears to be a new hypodense infiltrative region in the neck of the pancreas (image 27 of series 2). This is where the pancreatic duct dilation terminates. Spleen: Unremarkable Adrenals/Urinary Tract: - Right adrenal gland:  Unremarkable - Left adrenal gland: Unremarkable. - Right kidney: No hydronephrosis, nephrolithiasis, inflammation, or ureteral dilation. No focal lesion. - Left Kidney: No hydronephrosis, nephrolithiasis, inflammation, or ureteral dilation. No focal lesion. - Urinary Bladder: Unremarkable. Stomach/Bowel: - Stomach: Unremarkable. - Small bowel: Unremarkable - Appendix: Normal. - Colon: Unremarkable. Vascular/Lymphatic: Atherosclerotic changes of the aorta. No aneurysm. Calcifications bilateral iliac arteries. Small inguinal lymph nodes. No pelvic adenopathy. No retroperitoneal adenopathy with small lymph nodes present. Small lymph nodes/nodules within the gastrohepatic ligament as well as  adjacent to the pancreatic body. Portal caval node measures 10 mm. Reproductive: Diameter of the prostate measures 36 mm. Other: None Musculoskeletal: Multilevel degenerative changes of the thoracolumbar spine. No acute displaced fracture IMPRESSION: Current CT suggests pancreatic adenocarcinoma of the head of the pancreas, resulting in distal pancreatic duct dilation. Given that focal inflammatory changes/pancreatitis remains on the differential, further contrast-enhanced imaging pancreas protocol with either MRI or CT is indicated, as well as correlation with lab studies including CA 19 9. These results will be called to  the ordering clinician or representative by the Radiologist Assistant, and communication documented in the PACS or zVision Dashboard. Questionable lymphadenopathy of the upper abdomen which will be better assessed on future contrast-enhanced studies. Cirrhotic morphology of the liver. Aortic Atherosclerosis (ICD10-I70.0). Electronically Signed   By: Corrie Mckusick D.O.   On: 02/07/2019 10:31    ASSESSMENT & PLAN:  Pancreatic mass 1.  Pancreatic head mass: -Patient reported epigastric pain for 2 weeks. -CT scan of the abdomen and pelvis without contrast on 02/07/2019 showed new pancreatic ductal dilatation with in the body and the tail of the pancreas with new hypodense infiltrative region in the neck of the pancreas.  Small lymph nodes within the gastrohepatic ligament as well as adjacent to the pancreatic body.  Portacaval lymph node measures 10 mm.  Mild nodularity of the liver surface with enlarged left liver compatible with cirrhosis. -Patient was evaluated by PMD and was given Carafate and omeprazole.  He reports that his pain subsided yesterday. -Denies any fevers, night sweats or weight loss in the past few months.  He has good appetite. -He drank 3 to 4 cans of beer daily and quit about 2 months ago.  He has a 50-pack-year smoking history. -I have reviewed the images of the CT scan  with the patient and his sister.  I have recommended CT of the abdomen with contrast pancreatic protocol. -We have also recommended LFTs, CA 19-9 along with routine labs. -We will make a referral to GI for EUS and biopsy.  We will also order a PET CT scan for further staging. -If adenocarcinoma is confirmed, we will also refer him for germline mutation testing.  2.  Family history: -Brother died of lung cancer.  Father died of throat cancer. -Sister had ovarian cancer.  Nephew died of acute leukemia.  Maternal uncle had lung cancer.  Orders Placed This Encounter  Procedures  . CT Abdomen W Contrast    Pancreatic protocol    Standing Status:   Future    Standing Expiration Date:   02/13/2020    Order Specific Question:   ** REASON FOR EXAM (FREE TEXT)    Answer:   pancreatic mass    Order Specific Question:   If indicated for the ordered procedure, I authorize the administration of contrast media per Radiology protocol    Answer:   Yes    Order Specific Question:   Preferred imaging location?    Answer:   [facility]    Order Specific Question:   Is Oral Contrast requested for this exam?    Answer:   Yes, Per Radiology protocol    Order Specific Question:   Radiology Contrast Protocol - do NOT remove file path    Answer:   _0 [email or web address]  . NM PET Image Initial (PI) Skull Base To Thigh    Standing Status:   Future    Standing Expiration Date:   02/13/2020    Order Specific Question:   ** REASON FOR EXAM (FREE TEXT)    Answer:   pancreatic mass    Order Specific Question:   If indicated for the ordered procedure, I authorize the administration of a radiopharmaceutical per Radiology protocol    Answer:   Yes    Order Specific Question:   Preferred imaging location?    Answer:   [facility]    Order Specific Question:   Radiology Contrast Protocol - do NOT remove file path    Answer:   _1 [email or web address]  . Cancer  antigen  19-9    Standing Status:   Future    Standing Expiration Date:   02/13/2020  . Hepatic function panel    Standing Status:   Future    Standing Expiration Date:   02/13/2020  . Amylase    Standing Status:   Future    Standing Expiration Date:   02/13/2020  . Lipase    Standing Status:   Future    Standing Expiration Date:   02/13/2020    All questions were answered. The patient knows to call the clinic with any problems, questions or concerns.      Derek Jack, MD 02/13/2019 2:27 PM

## 2019-02-13 NOTE — Telephone Encounter (Signed)
Left message on machine to call back  

## 2019-02-13 NOTE — Patient Instructions (Addendum)
[place] at [facility] Discharge Instructions  You were seen today by Dr. Delton Coombes. He went over your history, family history and how you've been feeling latley. He will schedule you for a PET scan and a CT scan prior to your next visit. He will also draw blood today before you leave. He will refer you to a GI doctor. He will also schedule you for genetic testing. Call your HR department and discuss FMLA papers with them for work. He will follow up with you by phone with results.  Thank you for choosing [place] [facility] to provide your oncology and hematology care.  To afford each patient quality time with our provider, please arrive at least 15 minutes before your scheduled appointment time.   If you have a lab appointment with the [facility] please come in thru the  Main Entrance and check in at the main information desk  You need to re-schedule your appointment should you arrive 10 or more minutes late.  We strive to give you quality time with our providers, and arriving late affects you and other patients whose appointments are after yours.  Also, if you no show three or more times for appointments you may be dismissed from the clinic at the providers discretion.     Again, thank you for choosing [facility].  Our hope is that these requests will decrease the amount of time that you wait before being seen by our physicians.       _____________________________________________________________  Should you have questions after your visit to [facility], please contact our office at [phone] between the hours of 8:00 a.m. and 4:30 p.m.  Voicemails left after 4:00 p.m. will not be returned until the following business day.  For prescription refill requests, have your pharmacy contact our office and allow 72 hours.    Cancer Center Support Programs:   > Cancer Support Group  2nd Tuesday of the month  1pm-2pm, Journey Room

## 2019-02-14 LAB — CANCER ANTIGEN 19-9: CA 19-9: 40 U/mL — ABNORMAL HIGH (ref 0–35)

## 2019-02-14 NOTE — Telephone Encounter (Signed)
EUS scheduled, pt instructed and medications reviewed.  Patient instructions mailed to home.  Patient to call with any questions or concerns. COVID appt also given and pt instructed

## 2019-02-15 ENCOUNTER — Other Ambulatory Visit: Payer: Self-pay | Admitting: Medical

## 2019-02-25 ENCOUNTER — Ambulatory Visit (HOSPITAL_COMMUNITY): Payer: BC Managed Care – PPO

## 2019-02-25 ENCOUNTER — Encounter (HOSPITAL_COMMUNITY): Admission: RE | Admit: 2019-02-25 | Payer: BC Managed Care – PPO | Source: Ambulatory Visit

## 2019-02-26 ENCOUNTER — Encounter (HOSPITAL_COMMUNITY): Payer: Self-pay | Admitting: *Deleted

## 2019-02-26 ENCOUNTER — Other Ambulatory Visit: Payer: Self-pay

## 2019-02-26 ENCOUNTER — Other Ambulatory Visit (HOSPITAL_COMMUNITY): Payer: Self-pay | Admitting: *Deleted

## 2019-02-26 ENCOUNTER — Telehealth: Payer: Self-pay | Admitting: Gastroenterology

## 2019-02-26 ENCOUNTER — Inpatient Hospital Stay (HOSPITAL_COMMUNITY): Payer: BC Managed Care – PPO | Admitting: Hematology

## 2019-02-26 MED ORDER — TRAMADOL HCL 50 MG PO TABS: 50.0000 mg | ORAL_TABLET | Freq: Four times a day (QID) | ORAL | 0 refills | Status: DC | PRN

## 2019-02-26 NOTE — Telephone Encounter (Signed)
Pt has questions regarding upcoming EUS.  Please call pt to discuss.

## 2019-02-26 NOTE — Telephone Encounter (Signed)
The pt and his sister had questions regarding the location of the COVID test.  I have provided the date, time and address of the [facility].  The pt has been advised of the information and verbalized understanding.

## 2019-02-27 ENCOUNTER — Telehealth (HOSPITAL_COMMUNITY): Payer: Self-pay | Admitting: *Deleted

## 2019-03-01 ENCOUNTER — Other Ambulatory Visit (HOSPITAL_COMMUNITY): Payer: Self-pay | Admitting: Nurse Practitioner

## 2019-03-06 ENCOUNTER — Encounter (HOSPITAL_COMMUNITY): Payer: Self-pay | Admitting: *Deleted

## 2019-03-06 ENCOUNTER — Other Ambulatory Visit (HOSPITAL_COMMUNITY): Payer: Self-pay | Admitting: *Deleted

## 2019-03-06 ENCOUNTER — Other Ambulatory Visit (HOSPITAL_COMMUNITY): Payer: Self-pay | Admitting: Hematology

## 2019-03-06 MED ORDER — TRAMADOL HCL 50 MG PO TABS: 50.0000 mg | ORAL_TABLET | Freq: Four times a day (QID) | ORAL | 0 refills | Status: DC | PRN

## 2019-03-07 ENCOUNTER — Encounter (HOSPITAL_COMMUNITY): Payer: Self-pay | Admitting: Gastroenterology

## 2019-03-07 ENCOUNTER — Other Ambulatory Visit: Payer: Self-pay

## 2019-03-07 ENCOUNTER — Other Ambulatory Visit (HOSPITAL_COMMUNITY)
Admission: RE | Admit: 2019-03-07 | Discharge: 2019-03-07 | Disposition: A | Discharge status: Home / Self Care | Payer: BC Managed Care – PPO | Source: Ambulatory Visit | Attending: Gastroenterology | Admitting: Gastroenterology

## 2019-03-07 DIAGNOSIS — Z01812 Encounter for preprocedural laboratory examination: Secondary | ICD-10-CM | POA: Diagnosis not present

## 2019-03-07 DIAGNOSIS — Z20822 Contact with and (suspected) exposure to covid-19: Secondary | ICD-10-CM | POA: Diagnosis not present

## 2019-03-07 LAB — SARS CORONAVIRUS 2 (TAT 6-24 HRS): SARS Coronavirus 2: NEGATIVE

## 2019-03-07 NOTE — Progress Notes (Signed)
Mr. Helmholt denies chest pain or shortness of breath.  Patient had Covid  Test today and is in quarantine with his sister.

## 2019-03-11 ENCOUNTER — Ambulatory Visit (HOSPITAL_COMMUNITY): Payer: BC Managed Care – PPO | Admitting: Certified Registered Nurse Anesthetist

## 2019-03-11 ENCOUNTER — Other Ambulatory Visit: Payer: Self-pay | Admitting: Physician Assistant

## 2019-03-11 ENCOUNTER — Encounter (HOSPITAL_COMMUNITY)
Admission: RE | Disposition: A | Discharge status: Home / Self Care | Payer: Self-pay | Source: Home / Self Care | Attending: Gastroenterology

## 2019-03-11 ENCOUNTER — Encounter (HOSPITAL_COMMUNITY): Payer: Self-pay | Admitting: Gastroenterology

## 2019-03-11 ENCOUNTER — Ambulatory Visit (HOSPITAL_COMMUNITY)
Admission: RE | Admit: 2019-03-11 | Discharge: 2019-03-11 | Disposition: A | Discharge status: Home / Self Care | Payer: BC Managed Care – PPO | Attending: Gastroenterology | Admitting: Gastroenterology

## 2019-03-11 ENCOUNTER — Other Ambulatory Visit (HOSPITAL_COMMUNITY): Payer: Self-pay | Admitting: *Deleted

## 2019-03-11 DIAGNOSIS — K8689 Other specified diseases of pancreas: Secondary | ICD-10-CM | POA: Diagnosis not present

## 2019-03-11 DIAGNOSIS — K2980 Duodenitis without bleeding: Secondary | ICD-10-CM | POA: Diagnosis not present

## 2019-03-11 DIAGNOSIS — B1920 Unspecified viral hepatitis C without hepatic coma: Secondary | ICD-10-CM | POA: Insufficient documentation

## 2019-03-11 DIAGNOSIS — Z801 Family history of malignant neoplasm of trachea, bronchus and lung: Secondary | ICD-10-CM | POA: Insufficient documentation

## 2019-03-11 DIAGNOSIS — K228 Other specified diseases of esophagus: Secondary | ICD-10-CM | POA: Diagnosis not present

## 2019-03-11 DIAGNOSIS — F17210 Nicotine dependence, cigarettes, uncomplicated: Secondary | ICD-10-CM | POA: Diagnosis not present

## 2019-03-11 DIAGNOSIS — K3189 Other diseases of stomach and duodenum: Secondary | ICD-10-CM

## 2019-03-11 DIAGNOSIS — I700 Atherosclerosis of aorta: Secondary | ICD-10-CM | POA: Diagnosis not present

## 2019-03-11 DIAGNOSIS — J449 Chronic obstructive pulmonary disease, unspecified: Secondary | ICD-10-CM | POA: Insufficient documentation

## 2019-03-11 DIAGNOSIS — C772 Secondary and unspecified malignant neoplasm of intra-abdominal lymph nodes: Secondary | ICD-10-CM | POA: Insufficient documentation

## 2019-03-11 DIAGNOSIS — K317 Polyp of stomach and duodenum: Secondary | ICD-10-CM

## 2019-03-11 DIAGNOSIS — C250 Malignant neoplasm of head of pancreas: Secondary | ICD-10-CM | POA: Insufficient documentation

## 2019-03-11 DIAGNOSIS — K319 Disease of stomach and duodenum, unspecified: Secondary | ICD-10-CM | POA: Insufficient documentation

## 2019-03-11 DIAGNOSIS — C771 Secondary and unspecified malignant neoplasm of intrathoracic lymph nodes: Secondary | ICD-10-CM | POA: Diagnosis not present

## 2019-03-11 DIAGNOSIS — I10 Essential (primary) hypertension: Secondary | ICD-10-CM | POA: Diagnosis not present

## 2019-03-11 DIAGNOSIS — I899 Noninfective disorder of lymphatic vessels and lymph nodes, unspecified: Secondary | ICD-10-CM | POA: Diagnosis not present

## 2019-03-11 DIAGNOSIS — K219 Gastro-esophageal reflux disease without esophagitis: Secondary | ICD-10-CM | POA: Insufficient documentation

## 2019-03-11 HISTORY — PX: ESOPHAGOGASTRODUODENOSCOPY (EGD) WITH PROPOFOL: SHX5813

## 2019-03-11 HISTORY — PX: BIOPSY: SHX5522

## 2019-03-11 HISTORY — PX: POLYPECTOMY: SHX5525

## 2019-03-11 HISTORY — PX: FINE NEEDLE ASPIRATION: SHX5430

## 2019-03-11 HISTORY — PX: EUS: SHX5427

## 2019-03-11 HISTORY — DX: Gastro-esophageal reflux disease without esophagitis: K21.9

## 2019-03-11 LAB — CBC
HCT: 42 % (ref 39.0–52.0)
Hemoglobin: 14 g/dL (ref 13.0–17.0)
MCH: 32.9 pg (ref 26.0–34.0)
MCHC: 33.3 g/dL (ref 30.0–36.0)
MCV: 98.6 fL (ref 80.0–100.0)
Platelets: 253 10*3/uL (ref 150–400)
RBC: 4.26 MIL/uL (ref 4.22–5.81)
RDW: 11.9 % (ref 11.5–15.5)
WBC: 7.6 10*3/uL (ref 4.0–10.5)
nRBC: 0 % (ref 0.0–0.2)

## 2019-03-11 LAB — PROTIME-INR
INR: 1 (ref 0.8–1.2)
Prothrombin Time: 12.6 seconds (ref 11.4–15.2)

## 2019-03-11 SURGERY — UPPER ENDOSCOPIC ULTRASOUND (EUS) RADIAL
Anesthesia: Monitor Anesthesia Care

## 2019-03-11 MED ORDER — PROPOFOL 10 MG/ML IV BOLUS
INTRAVENOUS | Status: DC | PRN
  Administered 2019-03-11 (×3): 20 mg via INTRAVENOUS
  Administered 2019-03-11: 15 mg via INTRAVENOUS

## 2019-03-11 MED ORDER — SODIUM CHLORIDE 0.9 % IV SOLN: INTRAVENOUS | Status: DC

## 2019-03-11 MED ORDER — GLYCOPYRROLATE PF 0.2 MG/ML IJ SOSY
PREFILLED_SYRINGE | INTRAMUSCULAR | Status: DC | PRN
  Administered 2019-03-11: .1 mg via INTRAVENOUS

## 2019-03-11 MED ORDER — LIDOCAINE 2% (20 MG/ML) 5 ML SYRINGE
INTRAMUSCULAR | Status: DC | PRN
  Administered 2019-03-11: 60 mg via INTRAVENOUS

## 2019-03-11 MED ORDER — PROPOFOL 500 MG/50ML IV EMUL
INTRAVENOUS | Status: DC | PRN
  Administered 2019-03-11: 160 ug/kg/min via INTRAVENOUS
  Administered 2019-03-11: 80 ug/kg/min via INTRAVENOUS

## 2019-03-11 MED ORDER — SODIUM CHLORIDE 0.9 % IV SOLN: INTRAVENOUS | Status: DC | PRN

## 2019-03-11 MED ORDER — FENTANYL CITRATE (PF) 100 MCG/2ML IJ SOLN
25.0000 ug | INTRAMUSCULAR | Status: DC | PRN
  Administered 2019-03-11 (×2): 50 ug via INTRAVENOUS

## 2019-03-11 MED ORDER — FENTANYL CITRATE (PF) 100 MCG/2ML IJ SOLN
INTRAMUSCULAR | Status: DC | PRN
  Administered 2019-03-11: 25 ug via INTRAVENOUS

## 2019-03-11 MED ORDER — CIPROFLOXACIN IN D5W 400 MG/200ML IV SOLN
INTRAVENOUS | Status: DC | PRN
  Administered 2019-03-11: 400 mg via INTRAVENOUS

## 2019-03-11 MED ORDER — ONDANSETRON HCL 4 MG/2ML IJ SOLN: 4.0000 mg | Freq: Once | INTRAMUSCULAR | Status: DC | PRN

## 2019-03-11 MED ORDER — FENTANYL CITRATE (PF) 100 MCG/2ML IJ SOLN
INTRAMUSCULAR | Status: AC
  Filled 2019-03-11: qty 2

## 2019-03-11 MED ORDER — CIPROFLOXACIN IN D5W 400 MG/200ML IV SOLN
INTRAVENOUS | Status: AC
  Filled 2019-03-11: qty 200

## 2019-03-11 SURGICAL SUPPLY — 15 items

## 2019-03-11 NOTE — Anesthesia Procedure Notes (Signed)
Procedure Name: MAC Date/Time: 03/11/2019 12:24 PM Performed by: Janace Litten, CRNA Pre-anesthesia Checklist: Patient identified, Emergency Drugs available, Suction available and Patient being monitored Patient Re-evaluated:Patient Re-evaluated prior to induction Oxygen Delivery Method: Nasal cannula

## 2019-03-11 NOTE — Anesthesia Preprocedure Evaluation (Addendum)
Anesthesia Evaluation  Patient identified by MRN, date of birth, ID band Patient awake  General Assessment Comment:US guided PIVs every time  Reviewed: Allergy & Precautions, NPO status , Patient's Chart, lab work & pertinent test results  History of Anesthesia Complications (+) DIFFICULT IV STICK / SPECIAL LINE and history of anesthetic complications  Airway Mallampati: I  TM Distance: >3 FB Neck ROM: Full    Dental no notable dental hx. (+) Edentulous Upper, Dental Advisory Given   Pulmonary COPD, Current SmokerPatient did not abstain from smoking.,  23 pack year history, about 1/2ppd currently   Pulmonary exam normal breath sounds clear to auscultation       Cardiovascular hypertension, Normal cardiovascular exam Rhythm:Regular Rate:Normal  HLD     Neuro/Psych negative neurological ROS  negative psych ROS   GI/Hepatic GERD  Controlled and Medicated,(+) Hepatitis -, CHCV untreated Pancreatic mass   Endo/Other  negative endocrine ROS  Renal/GU negative Renal ROS  negative genitourinary   Musculoskeletal  (+) Arthritis , Osteoarthritis,    Abdominal Normal abdominal exam  (+)   Peds negative pediatric ROS (+)  Hematology negative hematology ROS (+)   Anesthesia Other Findings   Reproductive/Obstetrics negative OB ROS                         Anesthesia Physical Anesthesia Plan  ASA: III  Anesthesia Plan: MAC   Post-op Pain Management:    Induction:   PONV Risk Score and Plan: 2 and Propofol infusion and TIVA  Airway Management Planned: Natural Airway and Simple Face Mask  Additional Equipment: None  Intra-op Plan:   Post-operative Plan:   Informed Consent: I have reviewed the patients History and Physical, chart, labs and discussed the procedure including the risks, benefits and alternatives for the proposed anesthesia with the patient or authorized representative who has  indicated his/her understanding and acceptance.       Plan Discussed with: CRNA  Anesthesia Plan Comments:         Anesthesia Quick Evaluation

## 2019-03-11 NOTE — H&P (Signed)
GASTROENTEROLOGY PROCEDURE H&P NOTE   Primary Care Physician: Baruch Gouty, FNP  HPI: Duane Alexander is a 64 y.o. male who presents for EGD/EUS with possible Biopsy of Pancreatic Lesion/PD dilation concerning for possible malignancy.  Past Medical History:  Diagnosis Date  . Aortic atherosclerosis ([place])   . COPD (chronic obstructive pulmonary disease) ([place])    per CT findings 2019  . Family history of lung cancer   . GERD (gastroesophageal reflux disease)   . Hepatitis C    referred to hepatitis clinic 2019 but he never went  . Hyperlipidemia   . Hypertension   . Smoker   . Wears glasses    Past Surgical History:  Procedure Laterality Date  . ANTERIOR CERVICAL DECOMP/DISCECTOMY FUSION  12/2017   Dr. Consuella Lose  . COLONOSCOPY N/A 01/19/2017   Procedure: COLONOSCOPY;  Surgeon: Rogene Houston, MD;  Location: [facility];  Service: Endoscopy;  Laterality: N/A;  930-moved to 9:45 per Lelon Frohlich  . POLYPECTOMY  01/19/2017   Procedure: POLYPECTOMY;  Surgeon: Rogene Houston, MD;  Location: [facility];  Service: Endoscopy;;  splenic flexure  . Right eye surgery     as a child-removed muscle from leg and put in upper eye lid   Current Facility-Administered Medications  Medication Dose Route Frequency Provider Last Rate Last Admin  . 0.9 %  sodium chloride infusion   Intravenous Continuous Mansouraty, Telford Nab., MD       No Known Allergies Family History  Problem Relation Age of Onset  . Cancer Mother   . Other Father        died in his 9s of stomach issues?  . Cancer Brother 5       lung  . Other Sister        GI related death  . Heart disease Neg Hx   . Hypertension Neg Hx   . Stroke Neg Hx   . Colon cancer Neg Hx    Social History   Socioeconomic History  . Marital status: Widowed    Spouse name: Not on file  . Number of children: Not on file  . Years of education: Not on file  . Highest education level: Not on file  Occupational History   Employer: [employer]  Tobacco Use  . Smoking status: Current Every Day Smoker    Packs/day: 0.50    Years: 45.00    Pack years: 22.50    Types: Cigarettes  . Smokeless tobacco: Never Used  Substance and Sexual Activity  . Alcohol use: Not Currently  . Drug use: No  . Sexual activity: Not Currently  Other Topics Concern  . Not on file  Social History Narrative  . Not on file   Social Determinants of Health   Financial Resource Strain: Low Risk   . Difficulty of Paying Living Expenses: Not hard at all  Food Insecurity: No Food Insecurity  . Worried About Charity fundraiser in the Last Year: Never true  . Ran Out of Food in the Last Year: Never true  Transportation Needs: No Transportation Needs  . Lack of Transportation (Medical): No  . Lack of Transportation (Non-Medical): No  Physical Activity: Inactive  . Days of Exercise per Week: 0 days  . Minutes of Exercise per Session: 0 min  Stress: Stress Concern Present  . Feeling of Stress : Rather much  Social Connections: Moderately Isolated  . Frequency of Communication with Friends and Family: Three times a week  .  Frequency of Social Gatherings with Friends and Family: Three times a week  . Attends Religious Services: Never  . Active Member of Clubs or Organizations: No  . Attends Archivist Meetings: Never  . Marital Status: Widowed  Intimate Partner Violence: Not At Risk  . Fear of Current or Ex-Partner: No  . Emotionally Abused: No  . Physically Abused: No  . Sexually Abused: No    Physical Exam: Vital signs in last 24 hours: Temp:  [97.6 F (36.4 C)] 97.6 F (36.4 C) (03/08 1025) Pulse Rate:  [62] 62 (03/08 1025) Resp:  [11] 11 (03/08 1025) BP: (195)/(88) 195/88 (03/08 1025) SpO2:  [100 %] 100 % (03/08 1025) Weight:  [72.6 kg] 72.6 kg (03/08 1025)   GEN: NAD EYE: Sclerae anicteric ENT: MMM CV: Non-tachycardic GI: Soft, TTP in abdomen 7-8/10 discomfort currently NEURO:  Alert &  Oriented x 3  Lab Results: No results for input(s): WBC, HGB, HCT, PLT in the last 72 hours. BMET No results for input(s): NA, K, CL, CO2, GLUCOSE, BUN, CREATININE, CALCIUM in the last 72 hours. LFT No results for input(s): PROT, ALBUMIN, AST, ALT, ALKPHOS, BILITOT, BILIDIR, IBILI in the last 72 hours. PT/INR Recent Labs    03/11/19 1124  LABPROT 12.6  INR 1.0     Impression / Plan: This is a 64 y.o.male who presents for EGD/EUS with possible biopsy.  The risks of EUS including bleeding, infection, aspiration pneumonia and intestinal perforation were discussed as was the possibility it may not give a definitive diagnosis.  If a biopsy of the pancreas is done as part of the EUS, there is an additional risk of pancreatitis at the rate of about 1%.  It was explained that procedure related pancreatitis is typically mild, although can be severe and even life threatening, which is why we do not perform random pancreatic biopsies and only biopsy a lesion we feel is concerning enough to warrant the risk.  The risks and benefits of endoscopic evaluation were discussed with the patient; these include but are not limited to the risk of perforation, infection, bleeding, missed lesions, lack of diagnosis, severe illness requiring hospitalization, as well as anesthesia and sedation related illnesses.  The patient is agreeable to proceed.    Justice Britain, MD [facility] Office # [phone]

## 2019-03-11 NOTE — Anesthesia Postprocedure Evaluation (Signed)
Anesthesia Post Note  Patient: Duane Alexander  Procedure(s) Performed: UPPER ENDOSCOPIC ULTRASOUND (EUS) RADIAL (N/A ) ESOPHAGOGASTRODUODENOSCOPY (EGD) WITH PROPOFOL (N/A ) BIOPSY POLYPECTOMY FINE NEEDLE ASPIRATION (FNA) LINEAR     Patient location during evaluation: Endoscopy Anesthesia Type: MAC Level of consciousness: awake and alert Pain management: pain level controlled Vital Signs Assessment: post-procedure vital signs reviewed and stable Respiratory status: spontaneous breathing, nonlabored ventilation, respiratory function stable and patient connected to nasal cannula oxygen Cardiovascular status: blood pressure returned to baseline and stable Postop Assessment: no apparent nausea or vomiting Anesthetic complications: no    Last Vitals:  Vitals:   03/11/19 1349 03/11/19 1400  BP: (!) 164/96   Pulse: (!) 56   Resp: 11   Temp:  (!) 36.2 C  SpO2: 100%     Last Pain:  Vitals:   03/11/19 1349  TempSrc:   PainSc: 10-Worst pain ever                 Barnet Glasgow

## 2019-03-11 NOTE — Discharge Instructions (Signed)
YOU HAD AN ENDOSCOPIC PROCEDURE TODAY: Refer to the procedure report and other information in the discharge instructions given to you for any specific questions about what was found during the examination. If this information does not answer your questions, please call [place] office at [phone] to clarify.   YOU SHOULD EXPECT: Some feelings of bloating in the abdomen. Passage of more gas than usual. Walking can help get rid of the air that was put into your GI tract during the procedure and reduce the bloating. If you had a lower endoscopy (such as a colonoscopy or flexible sigmoidoscopy) you may notice spotting of blood in your stool or on the toilet paper. Some abdominal soreness may be present for a day or two, also.  DIET: Your first meal following the procedure should be a light meal and then it is ok to progress to your normal diet. A half-sandwich or bowl of soup is an example of a good first meal. Heavy or fried foods are harder to digest and may make you feel nauseous or bloated. Drink plenty of fluids but you should avoid alcoholic beverages for 24 hours. If you had a esophageal dilation, please see attached instructions for diet.    ACTIVITY: Your care partner should take you home directly after the procedure. You should plan to take it easy, moving slowly for the rest of the day. You can resume normal activity the day after the procedure however YOU SHOULD NOT DRIVE, use power tools, machinery or perform tasks that involve climbing or major physical exertion for 24 hours (because of the sedation medicines used during the test).   SYMPTOMS TO REPORT IMMEDIATELY: A gastroenterologist can be reached at any hour. Please call [phone]  for any of the following symptoms:   Following upper endoscopy (EGD, EUS, ERCP, esophageal dilation) Vomiting of blood or coffee ground material  New, significant abdominal pain  New, significant chest pain or pain under the shoulder blades  Painful or  persistently difficult swallowing  New shortness of breath  Black, tarry-looking or red, bloody stools  FOLLOW UP:  If any biopsies were taken you will be contacted by phone or by letter within the next 1-3 weeks. Call [phone]  if you have not heard about the biopsies in 3 weeks.  Please also call with any specific questions about appointments or follow up tests.  

## 2019-03-11 NOTE — Op Note (Signed)
[facility] Patient Name: Duane Alexander Procedure Date : 03/11/2019 MRN: [id number] Attending MD: Justice Britain , MD Date of Birth: [birth year] CSN: [id number] Age: 64 Admit Type: Inpatient Procedure:                Upper EUS Indications:              Suspected mass in pancreas on CT scan, Epigastric                            abdominal pain, Generalized abdominal pain Providers:                Justice Britain, MD, Carlyn Reichert, RN, Lazaro Arms, Technician Referring MD:             Derek Jack, MD, Areeg N. Rehman Medicines:                Monitored Anesthesia Care Complications:            No immediate complications. Estimated Blood Loss:     Estimated blood loss was minimal. Procedure:                Pre-Anesthesia Assessment:                           - Prior to the procedure, a History and Physical                            was performed, and patient medications and                            allergies were reviewed. The patient's tolerance of                            previous anesthesia was also reviewed. The risks                            and benefits of the procedure and the sedation                            options and risks were discussed with the patient.                            All questions were answered, and informed consent                            was obtained. Prior Anticoagulants: The patient has                            taken no previous anticoagulant or antiplatelet                            agents. ASA Grade Assessment: III - A patient with  severe systemic disease. After reviewing the risks                            and benefits, the patient was deemed in                            satisfactory condition to undergo the procedure.                           After obtaining informed consent, the endoscope was                            passed under direct vision.  Throughout the                            procedure, the patient's blood pressure, pulse, and                            oxygen saturations were monitored continuously. The                            [id number]-H190 ([id number]) Olympus gastroscope was                            introduced through the mouth, and advanced to the                            second part of duodenum. The [id number]-Q180V ([id number])                            Evant was introduced through the                            mouth, and advanced to the second part of duodenum.                            The GF-UTC180 ([id number]) Olympus Linear EUS scope                            was introduced through the mouth, and advanced to                            the duodenum for ultrasound examination from the                            stomach and duodenum. The upper EUS was                            accomplished without difficulty. The patient                            tolerated the procedure. Scope In: Scope Out: Findings:      ENDOSCOPIC FINDING: :      No gross lesions were noted  in the entire esophagus.      The Z-line was irregular and was found 41 cm from the incisors.      Patchy mildly erythematous mucosa without bleeding was found in the       entire examined stomach. Biopsies were taken with a cold forceps for       histology and Helicobacter pylori testing.      A single 5 mm sessile polyp with no bleeding was found in the second       portion of the duodenum. The polyp was removed with a cold snare.       Resection and retrieval were complete.      The ampulla was normal.      ENDOSONOGRAPHIC FINDING: :      An irregular mass was identified in the pancreatic head at the region of       pancreatic duct dilation. The mass was hypoechoic. The mass measured at       least 25 mm by 22 mm in maximal cross-sectional diameter but was       irregular and difficult to discern completely as the endosonographic        borders were poorly-defined. There was sonographic evidence suggesting       invasion into the superior mesenteric artery (manifested by encasement)       and the portal vein (manifested by abutment). An intact interface was       seen between the mass and the celiac trunk suggesting a lack of       invasion. The remainder of the pancreas was examined. The       endosonographic appearance of parenchyma and the upstream pancreatic       duct indicated duct dilation, prominent ductal side-branches in the       tail, parenchymal atrophy and parenchymal hyperechoic strands. The       pancreatic duct in the head measured 5.2 mm. The pancreatic duct in the       neck measured 5.2 mm. The pancreatic duct in the body measured 4.9 mm.       The pancreatic duct in the tail measured 2.9 mm -> 2.7 mm. Fine needle       biopsy was performed of the mass. Color Doppler imaging was utilized       prior to needle puncture to confirm a lack of significant vascular       structures within the needle path. Five passes were made with the       Acquire 22 gauge ultrasound core biopsy needle using a transduodenal       approach. Visible cores of tissue were obtained. Preliminary cytologic       examination and touch preps were performed. Final cytology results are       pending.      Endosonographic imaging in the common bile duct and in the common       hepatic duct showed no stones, sludge or dilation. The CBD measured 2.7       mm.      A few malignant-appearing lymph nodes were visualized in the porta       hepatis region as well as in the peripancreatic region. The largest       measured 26 mm by 23 mm in maximal cross-sectional diameter was in the       porta hepatis. The nodes were irregular, hypoechoic and had well defined       margins. Fine  needle biopsy was performed. Color Doppler imaging was       utilized prior to needle puncture to confirm a lack of significant       vascular structures within the  needle path. Four passes were made with       the 22 gauge ultrasound core biopsy needle using a transduodenal       approach. Visible cores of tissue were obtained. A preliminary cytologic       examination was performed. Final cytology results are pending.      Endosonographic imaging in the visualized portion of the liver showed no       mass.      The celiac region was visualized. Impression:               EGD Impression:                           - No gross lesions in esophagus.                           - Z-line irregular, 41 cm from the incisors.                           - Erythematous mucosa in the stomach. Biopsied.                           - A single duodenal polyp. Resected and retrieved.                           - Normal ampulla.                           EUS Impression:                           - A mass was identified in the pancreatic head.                            Cytology results are pending. However, the                            endosonographic appearance is highly suspicious for                            adenocarcinoma. This was staged T4 N1 Mx by                            endosonographic criteria. The staging applies if                            malignancy is confirmed and if the lymph node is                            positive as well. Fine needle biopsy performed.                            Would review upcoming CT-Abdomen with  IV Contrast                            for further delineation of the vasculature.                           - A few malignant-appearing lymph nodes were                            visualized in the porta hepatis region. Cytology                            results are pending. However, the endosonographic                            appearance is highly suspicious for metastatic                            pancreatic adenocarcinoma. Fine needle biopsy                            performed. Recommendation:           - The patient will  be observed post-procedure,                            until all discharge criteria are met.                           - Discharge patient to home.                           - Patient has a contact number available for                            emergencies. The signs and symptoms of potential                            delayed complications were discussed with the                            patient. Return to normal activities tomorrow.                            Written discharge instructions were provided to the                            patient.                           - Low fat diet for 2 weeks.                           - Monitor for signs/symptoms of bleeding,                            perforation,  pancreatitis, and infection. If issues                            please call our number to get further assistance as                            needed.                           - Await cytology results and await path results.                           - Observe patient's clinical course.                           - The findings and recommendations were discussed                            with the patient.                           - The findings and recommendations were discussed                            with the patient's family. Procedure Code(s):        --- Professional ---                           [phone], Esophagogastroduodenoscopy, flexible,                            transoral; with transendoscopic ultrasound-guided                            intramural or transmural fine needle                            aspiration/biopsy(s), (includes endoscopic                            ultrasound examination limited to the esophagus,                            stomach or duodenum, and adjacent structures)                           43251, Esophagogastroduodenoscopy, flexible,                            transoral; with removal of tumor(s), polyp(s), or                            other  lesion(s) by snare technique Diagnosis Code(s):        --- Professional ---                           K22.8, Other specified diseases of  esophagus                           K31.89, Other diseases of stomach and duodenum                           K31.7, Polyp of stomach and duodenum                           K86.89, Other specified diseases of pancreas                           I89.9, Noninfective disorder of lymphatic vessels                            and lymph nodes, unspecified                           R10.13, Epigastric pain                           R10.84, Generalized abdominal pain                           R93.3, Abnormal findings on diagnostic imaging of                            other parts of digestive tract CPT copyright 2019 [employer]. All rights reserved. The codes documented in this report are preliminary and upon coder review may  be revised to meet current compliance requirements. Justice Britain, MD 03/11/2019 2:02:25 PM Number of Addenda: 0

## 2019-03-11 NOTE — Transfer of Care (Signed)
Immediate Anesthesia Transfer of Care Note  Patient: Duane Alexander  Procedure(s) Performed: UPPER ENDOSCOPIC ULTRASOUND (EUS) RADIAL (N/A ) ESOPHAGOGASTRODUODENOSCOPY (EGD) WITH PROPOFOL (N/A ) BIOPSY POLYPECTOMY FINE NEEDLE ASPIRATION (FNA) LINEAR  Patient Location: PACU  Anesthesia Type:MAC  Level of Consciousness: drowsy and patient cooperative  Airway & Oxygen Therapy: Patient Spontanous Breathing  Post-op Assessment: Report given to RN and Post -op Vital signs reviewed and stable  Post vital signs: Reviewed and stable  Last Vitals:  Vitals Value Taken Time  BP 144/82 03/11/19 1334  Temp    Pulse 58 03/11/19 1335  Resp 12 03/11/19 1335  SpO2 100 % 03/11/19 1335  Vitals shown include unvalidated device data.  Last Pain:  Vitals:   03/11/19 1025  TempSrc: Oral  PainSc: 5          Complications: No apparent anesthesia complications

## 2019-03-11 NOTE — Progress Notes (Signed)
Patient has not had labs done for CT scan tomorrow. I received notification from radiology and orders were placed for them to do I-stat creatinine tomorrow before scan.

## 2019-03-12 ENCOUNTER — Encounter: Payer: Self-pay | Admitting: *Deleted

## 2019-03-12 ENCOUNTER — Other Ambulatory Visit: Payer: Self-pay

## 2019-03-12 ENCOUNTER — Encounter (HOSPITAL_COMMUNITY): Payer: Self-pay | Admitting: *Deleted

## 2019-03-12 ENCOUNTER — Ambulatory Visit (HOSPITAL_COMMUNITY): Payer: BC Managed Care – PPO

## 2019-03-12 ENCOUNTER — Ambulatory Visit (HOSPITAL_COMMUNITY)
Admission: RE | Admit: 2019-03-12 | Discharge: 2019-03-12 | Disposition: A | Discharge status: Home / Self Care | Payer: BC Managed Care – PPO | Source: Ambulatory Visit | Attending: Hematology | Admitting: Hematology

## 2019-03-12 ENCOUNTER — Other Ambulatory Visit (HOSPITAL_COMMUNITY): Payer: Self-pay | Admitting: Nurse Practitioner

## 2019-03-12 DIAGNOSIS — K8689 Other specified diseases of pancreas: Secondary | ICD-10-CM | POA: Insufficient documentation

## 2019-03-12 LAB — POCT I-STAT CREATININE: Creatinine, Ser: 0.8 mg/dL (ref 0.61–1.24)

## 2019-03-12 MED ORDER — TRAMADOL HCL 50 MG PO TABS: 50.0000 mg | ORAL_TABLET | Freq: Four times a day (QID) | ORAL | 0 refills | Status: DC | PRN

## 2019-03-12 MED ORDER — SODIUM CHLORIDE (PF) 0.9 % IJ SOLN
INTRAMUSCULAR | Status: AC
  Filled 2019-03-12: qty 50

## 2019-03-12 MED ORDER — IOHEXOL 300 MG/ML  SOLN
100.0000 mL | Freq: Once | INTRAMUSCULAR | Status: AC | PRN
  Administered 2019-03-12: 100 mL via INTRAVENOUS

## 2019-03-12 MED ORDER — SODIUM CHLORIDE (PF) 0.9 % IJ SOLN
INTRAMUSCULAR | Status: AC
  Filled 2019-03-12: qty 100

## 2019-03-13 ENCOUNTER — Encounter (HOSPITAL_COMMUNITY): Payer: Self-pay | Admitting: *Deleted

## 2019-03-13 LAB — SURGICAL PATHOLOGY

## 2019-03-13 LAB — CYTOLOGY - NON PAP

## 2019-03-14 ENCOUNTER — Ambulatory Visit (HOSPITAL_COMMUNITY): Payer: BC Managed Care – PPO | Admitting: Genetic Counselor

## 2019-03-14 ENCOUNTER — Encounter: Payer: Self-pay | Admitting: Family Medicine

## 2019-03-14 ENCOUNTER — Inpatient Hospital Stay (HOSPITAL_COMMUNITY): Payer: BC Managed Care – PPO

## 2019-03-14 ENCOUNTER — Ambulatory Visit (HOSPITAL_COMMUNITY): Payer: BC Managed Care – PPO | Admitting: Hematology

## 2019-03-15 ENCOUNTER — Encounter: Payer: Self-pay | Admitting: Gastroenterology

## 2019-03-18 ENCOUNTER — Ambulatory Visit (HOSPITAL_COMMUNITY)
Admission: RE | Admit: 2019-03-18 | Discharge: 2019-03-18 | Disposition: A | Discharge status: Home / Self Care | Payer: BC Managed Care – PPO | Source: Ambulatory Visit | Attending: Hematology | Admitting: Hematology

## 2019-03-18 ENCOUNTER — Other Ambulatory Visit: Payer: Self-pay

## 2019-03-18 DIAGNOSIS — K8689 Other specified diseases of pancreas: Secondary | ICD-10-CM | POA: Insufficient documentation

## 2019-03-19 ENCOUNTER — Encounter (HOSPITAL_COMMUNITY): Payer: Self-pay | Admitting: *Deleted

## 2019-03-20 ENCOUNTER — Ambulatory Visit (HOSPITAL_COMMUNITY): Payer: BC Managed Care – PPO | Admitting: Hematology

## 2019-03-21 ENCOUNTER — Other Ambulatory Visit (HOSPITAL_COMMUNITY): Payer: Self-pay | Admitting: *Deleted

## 2019-03-21 ENCOUNTER — Encounter (HOSPITAL_COMMUNITY): Payer: Self-pay | Admitting: *Deleted

## 2019-03-21 ENCOUNTER — Encounter (HOSPITAL_COMMUNITY): Payer: Self-pay

## 2019-03-21 MED ORDER — HYDROCODONE-ACETAMINOPHEN 5-325 MG PO TABS: 1.0000 | ORAL_TABLET | Freq: Four times a day (QID) | ORAL | 0 refills | Status: DC | PRN

## 2019-03-23 ENCOUNTER — Other Ambulatory Visit: Payer: Self-pay

## 2019-03-23 ENCOUNTER — Emergency Department (HOSPITAL_COMMUNITY)
Admission: EM | Admit: 2019-03-23 | Discharge: 2019-03-23 | Disposition: A | Discharge status: Home / Self Care | Payer: BC Managed Care – PPO | Attending: Emergency Medicine | Admitting: Emergency Medicine

## 2019-03-23 ENCOUNTER — Encounter (HOSPITAL_COMMUNITY): Payer: Self-pay | Admitting: Emergency Medicine

## 2019-03-23 DIAGNOSIS — R112 Nausea with vomiting, unspecified: Secondary | ICD-10-CM | POA: Diagnosis not present

## 2019-03-23 DIAGNOSIS — Z79899 Other long term (current) drug therapy: Secondary | ICD-10-CM | POA: Diagnosis not present

## 2019-03-23 DIAGNOSIS — F17210 Nicotine dependence, cigarettes, uncomplicated: Secondary | ICD-10-CM | POA: Insufficient documentation

## 2019-03-23 DIAGNOSIS — I10 Essential (primary) hypertension: Secondary | ICD-10-CM | POA: Insufficient documentation

## 2019-03-23 DIAGNOSIS — J449 Chronic obstructive pulmonary disease, unspecified: Secondary | ICD-10-CM | POA: Diagnosis not present

## 2019-03-23 DIAGNOSIS — Z8507 Personal history of malignant neoplasm of pancreas: Secondary | ICD-10-CM | POA: Insufficient documentation

## 2019-03-23 LAB — CBC WITH DIFFERENTIAL/PLATELET
Abs Immature Granulocytes: 0.02 10*3/uL (ref 0.00–0.07)
Basophils Absolute: 0 10*3/uL (ref 0.0–0.1)
Basophils Relative: 0 %
Eosinophils Absolute: 0.1 10*3/uL (ref 0.0–0.5)
Eosinophils Relative: 1 %
HCT: 41.4 % (ref 39.0–52.0)
Hemoglobin: 13.6 g/dL (ref 13.0–17.0)
Immature Granulocytes: 0 %
Lymphocytes Relative: 23 %
Lymphs Abs: 1.8 10*3/uL (ref 0.7–4.0)
MCH: 33.2 pg (ref 26.0–34.0)
MCHC: 32.9 g/dL (ref 30.0–36.0)
MCV: 101 fL — ABNORMAL HIGH (ref 80.0–100.0)
Monocytes Absolute: 0.5 10*3/uL (ref 0.1–1.0)
Monocytes Relative: 6 %
Neutro Abs: 5.2 10*3/uL (ref 1.7–7.7)
Neutrophils Relative %: 70 %
Platelets: ADEQUATE 10*3/uL (ref 150–400)
RBC: 4.1 MIL/uL — ABNORMAL LOW (ref 4.22–5.81)
RDW: 12.2 % (ref 11.5–15.5)
WBC: 7.6 10*3/uL (ref 4.0–10.5)
nRBC: 0 % (ref 0.0–0.2)

## 2019-03-23 LAB — COMPREHENSIVE METABOLIC PANEL
ALT: 38 U/L (ref 0–44)
AST: 37 U/L (ref 15–41)
Albumin: 3.9 g/dL (ref 3.5–5.0)
Alkaline Phosphatase: 55 U/L (ref 38–126)
Anion gap: 11 (ref 5–15)
BUN: 26 mg/dL — ABNORMAL HIGH (ref 8–23)
CO2: 25 mmol/L (ref 22–32)
Calcium: 9.2 mg/dL (ref 8.9–10.3)
Chloride: 102 mmol/L (ref 98–111)
Creatinine, Ser: 1.31 mg/dL — ABNORMAL HIGH (ref 0.61–1.24)
GFR calc Af Amer: >60 mL/min (ref >60)
GFR calc non Af Amer: 57 mL/min — ABNORMAL LOW (ref >60)
Glucose, Bld: 116 mg/dL — ABNORMAL HIGH (ref 70–99)
Potassium: 4.4 mmol/L (ref 3.5–5.1)
Sodium: 138 mmol/L (ref 135–145)
Total Bilirubin: 1 mg/dL (ref 0.3–1.2)
Total Protein: 7.9 g/dL (ref 6.5–8.1)

## 2019-03-23 LAB — LIPASE, BLOOD: Lipase: 108 U/L — ABNORMAL HIGH (ref 11–51)

## 2019-03-23 MED ORDER — SODIUM CHLORIDE 0.9 % IV BOLUS
2000.0000 mL | Freq: Once | INTRAVENOUS | Status: AC
  Administered 2019-03-23: 2000 mL via INTRAVENOUS

## 2019-03-23 MED ORDER — ONDANSETRON 4 MG PO TBDP: ORAL_TABLET | ORAL | 0 refills | Status: DC

## 2019-03-23 MED ORDER — ONDANSETRON 4 MG PO TBDP
4.0000 mg | ORAL_TABLET | Freq: Once | ORAL | Status: AC
  Administered 2019-03-23: 4 mg via ORAL
  Filled 2019-03-23: qty 1

## 2019-03-23 NOTE — ED Triage Notes (Signed)
Patient states nausea and vomiting starting yesterday. Pt states recent diagnosis of pancreatic cancer. Pt states he called Dr. Tera Helper and his office told him to come in today.

## 2019-03-23 NOTE — Discharge Instructions (Addendum)
Drink plenty of fluids and follow-up with your doctor next week °

## 2019-03-23 NOTE — ED Provider Notes (Signed)
[facility] EMERGENCY DEPARTMENT Provider Note   CSN: [id number] Arrival date & time: 03/23/19  1447     History Chief Complaint  Patient presents with  . Nausea  . Emesis    Duane Alexander is a 64 y.o. male.  Patient has a history of pancreatic cancer that has been recently diagnosed.  Patient complains of some vomiting and spoke with his doctor's office and they stated he should come here.  Patient not having any significant discomfort  The history is provided by the patient. No language interpreter was used.  Emesis Severity:  Mild Timing:  Constant Quality:  Bilious material Able to tolerate:  Liquids Progression:  Improving Chronicity:  New Recent urination:  Normal Context: not post-tussive   Relieved by:  Nothing Worsened by:  Nothing Associated symptoms: no abdominal pain, no cough, no diarrhea and no headaches        Past Medical History:  Diagnosis Date  . Aortic atherosclerosis ([place])   . COPD (chronic obstructive pulmonary disease) ([place])    per CT findings 2019  . Family history of lung cancer   . GERD (gastroesophageal reflux disease)   . Hepatitis C    referred to hepatitis clinic 2019 but he never went  . Hyperlipidemia   . Hypertension   . Smoker   . Wears glasses     Patient Active Problem List   Diagnosis Date Noted  . Pancreatic mass 02/08/2019  . Epigastric pain 02/08/2019  . Chronic hepatitis ([place]) 02/08/2019  . Abnormal serum level of lipase 01/24/2019  . Cirrhosis of liver without ascites ([place]) 01/23/2019  . Acute gastritis without hemorrhage 01/23/2019  . Abdominal pain 01/23/2019  . Noncompliance 08/10/2018  . Vaccine counseling 08/10/2018  . Need for pneumococcal vaccination 08/10/2018  . Neuropathy 08/10/2018  . Chronic pain of both upper extremities 08/10/2018  . Chronic pain of both lower extremities 08/10/2018  . Skin lesion 08/10/2018  . Chronic hepatitis C without hepatic coma ([place]) 10/20/2017  . Coronary artery  calcification seen on CT scan 09/05/2017  . Arm numbness 09/01/2017  . Muscle weakness 09/01/2017  . DDD (degenerative disc disease), lumbar 09/01/2017  . Right leg weakness 09/01/2017  . COPD (chronic obstructive pulmonary disease) ([place]) 07/01/2016  . Encounter for health maintenance examination in adult 06/17/2016  . Hyperlipidemia 06/02/2016  . Aortic atherosclerosis ([place]) 05/19/2016  . Screening for prostate cancer 05/16/2016  . Hypertension 05/10/2016  . Screen for colon cancer 05/10/2016  . Family history of lung cancer 05/10/2016  . Chronic left shoulder pain 04/28/2016  . Tinnitus 04/28/2016  . Lump of skin 04/28/2016  . Smoking 04/28/2016  . Wheezing 04/28/2016    Past Surgical History:  Procedure Laterality Date  . ANTERIOR CERVICAL DECOMP/DISCECTOMY FUSION  12/2017   Dr. Consuella Lose  . BIOPSY  03/11/2019   Procedure: BIOPSY;  Surgeon: [name]., MD;  Location: [facility];  Service: Gastroenterology;;  . COLONOSCOPY N/A 01/19/2017   Procedure: COLONOSCOPY;  Surgeon: Rogene Houston, MD;  [facility];  Service: Endoscopy;  Laterality: N/A;  930-moved to 9:45 per Lelon Frohlich  . ESOPHAGOGASTRODUODENOSCOPY (EGD) WITH PROPOFOL N/A 03/11/2019   Procedure: ESOPHAGOGASTRODUODENOSCOPY (EGD) WITH PROPOFOL;  Surgeon: Rush Landmark Telford Nab., MD;  Location: [facility] ENDOSCOPY;  Service: Gastroenterology;  Laterality: N/A;  . EUS N/A 03/11/2019   Procedure: UPPER ENDOSCOPIC ULTRASOUND (EUS) RADIAL;  Surgeon: Rush Landmark Telford Nab., MD;  Location: [facility];  Service: Gastroenterology;  Laterality: N/A;  . FINE NEEDLE ASPIRATION  03/11/2019  Procedure: FINE NEEDLE ASPIRATION (FNA) LINEAR;  Surgeon: [name]., MD;  Location: [facility];  Service: Gastroenterology;;  . POLYPECTOMY  01/19/2017   Procedure: POLYPECTOMY;  Surgeon: Rogene Houston, MD;  [facility];  Service: Endoscopy;;  splenic flexure  . POLYPECTOMY  03/11/2019   Procedure:  POLYPECTOMY;  Surgeon: Mansouraty, Telford Nab., MD;  Location: [facility] ENDOSCOPY;  Service: Gastroenterology;;  . Right eye surgery     as a child-removed muscle from leg and put in upper eye lid       Family History  Problem Relation Age of Onset  . Cancer Mother   . Other Father        died in his 56s of stomach issues?  . Cancer Brother 54       lung  . Other Sister        GI related death  . Heart disease Neg Hx   . Hypertension Neg Hx   . Stroke Neg Hx   . Colon cancer Neg Hx     Social History   Tobacco Use  . Smoking status: Current Every Day Smoker    Packs/day: 0.50    Years: 45.00    Pack years: 22.50    Types: Cigarettes  . Smokeless tobacco: Never Used  Substance Use Topics  . Alcohol use: Not Currently  . Drug use: No    Home Medications Prior to Admission medications   Medication Sig Start Date End Date Taking? Authorizing Provider  HYDROcodone-acetaminophen (NORCO/VICODIN) 5-325 MG tablet Take 1 tablet by mouth every 6 (six) hours as needed for moderate pain. 03/21/19   Derek Jack, MD  lisinopril (ZESTRIL) 20 MG tablet TAKE 1 TABLET BY MOUTH DAILY Patient taking differently: Take 20 mg by mouth daily.  02/15/19   Tysinger, [name], PA-C  lovastatin (MEVACOR) 20 MG tablet TAKE 1 TABLET BY MOUTH AT BEDTIME Patient taking differently: Take 20 mg by mouth at bedtime.  02/15/19   Tysinger, [name], PA-C  Multiple Vitamin (MULTIVITAMIN) tablet Take 1 tablet by mouth daily.    [provider]  omeprazole (PRILOSEC) 40 MG capsule Take 40 mg by mouth daily. 02/19/19   [provider]  ondansetron (ZOFRAN ODT) 4 MG disintegrating tablet 4mg  ODT q4 hours prn nausea/vomit 03/23/19   Milton Ferguson, MD    Allergies    Patient has no known allergies.  Review of Systems   Review of Systems  Constitutional: Negative for appetite change and fatigue.  HENT: Negative for congestion, ear discharge and sinus pressure.   Eyes: Negative for discharge.   Respiratory: Negative for cough.   Cardiovascular: Negative for chest pain.  Gastrointestinal: Positive for vomiting. Negative for abdominal pain and diarrhea.  Genitourinary: Negative for frequency and hematuria.  Musculoskeletal: Negative for back pain.  Skin: Negative for rash.  Neurological: Negative for seizures and headaches.  Psychiatric/Behavioral: Negative for hallucinations.    Physical Exam Updated Vital Signs BP 117/69 (BP Location: Right Arm)   Pulse 78   Temp 98.2 F (36.8 C) (Oral)   Resp 16   Ht 5\' 10"  (1.778 m)   Wt 72.6 kg   SpO2 97%   BMI 22.96 kg/m   Physical Exam Vitals and nursing note reviewed.  Constitutional:      Appearance: He is well-developed.  HENT:     Head: Normocephalic.     Nose: Nose normal.  Eyes:     General: No scleral icterus.    Conjunctiva/sclera: Conjunctivae normal.  Neck:     Thyroid: No thyromegaly.  Cardiovascular:     Rate and Rhythm: Normal rate and regular rhythm.     Heart sounds: No murmur. No friction rub. No gallop.   Pulmonary:     Breath sounds: No stridor. No wheezing or rales.  Chest:     Chest wall: No tenderness.  Abdominal:     General: There is no distension.     Tenderness: There is no abdominal tenderness. There is no rebound.  Musculoskeletal:        General: Normal range of motion.     Cervical back: Neck supple.  Lymphadenopathy:     Cervical: No cervical adenopathy.  Skin:    Findings: No erythema or rash.  Neurological:     Mental Status: He is oriented to person, place, and time.     Motor: No abnormal muscle tone.     Coordination: Coordination normal.  Psychiatric:        Behavior: Behavior normal.     ED Results / Procedures / Treatments   Labs (all labs ordered are listed, but only abnormal results are displayed) Labs Reviewed  CBC WITH DIFFERENTIAL/PLATELET - Abnormal; Notable for the following components:      Result Value   RBC 4.10 (*)    MCV 101.0 (*)    All other  components within normal limits  COMPREHENSIVE METABOLIC PANEL - Abnormal; Notable for the following components:   Glucose, Bld 116 (*)    BUN 26 (*)    Creatinine, Ser 1.31 (*)    GFR calc non Af Amer 57 (*)    All other components within normal limits  LIPASE, BLOOD - Abnormal; Notable for the following components:   Lipase 108 (*)    All other components within normal limits    EKG None  Radiology No results found.  Procedures Procedures (including critical care time)  Medications Ordered in ED Medications  ondansetron (ZOFRAN-ODT) disintegrating tablet 4 mg (4 mg Oral Given 03/23/19 1609)  sodium chloride 0.9 % bolus 2,000 mL (2,000 mLs Intravenous New Bag/Given 03/23/19 1610)    ED Course  I have reviewed the triage vital signs and the nursing notes.  Pertinent labs & imaging results that were available during my care of the patient were reviewed by me and considered in my medical decision making (see chart for details).    MDM Rules/Calculators/A&P                     Patient with dehydration and vomiting.  Labs unremarkable except mildly elevated lipase.  Patient is feeling better with Zofran and will be sent home with Zofran follow-up with his oncologist  Final Clinical Impression(s) / ED Diagnoses Final diagnoses:  Non-intractable vomiting with nausea, unspecified vomiting type    Rx / DC Orders ED Discharge Orders         Ordered    ondansetron (ZOFRAN ODT) 4 MG disintegrating tablet     03/23/19 1734           Milton Ferguson, MD 03/23/19 1738

## 2019-03-25 ENCOUNTER — Encounter (HOSPITAL_COMMUNITY): Payer: Self-pay | Admitting: *Deleted

## 2019-03-26 ENCOUNTER — Other Ambulatory Visit: Payer: Self-pay | Admitting: Medical

## 2019-03-27 ENCOUNTER — Encounter (HOSPITAL_COMMUNITY)
Admission: RE | Admit: 2019-03-27 | Discharge: 2019-03-27 | Disposition: A | Discharge status: Home / Self Care | Payer: BC Managed Care – PPO | Source: Ambulatory Visit | Attending: Hematology | Admitting: Hematology

## 2019-03-27 ENCOUNTER — Ambulatory Visit (HOSPITAL_COMMUNITY)
Admission: RE | Admit: 2019-03-27 | Discharge: 2019-03-27 | Disposition: A | Discharge status: Home / Self Care | Payer: BC Managed Care – PPO | Source: Ambulatory Visit | Attending: Hematology | Admitting: Hematology

## 2019-03-27 ENCOUNTER — Encounter (HOSPITAL_COMMUNITY): Payer: Self-pay

## 2019-03-27 ENCOUNTER — Other Ambulatory Visit: Payer: Self-pay

## 2019-03-27 ENCOUNTER — Other Ambulatory Visit (HOSPITAL_COMMUNITY): Payer: Self-pay | Admitting: Hematology

## 2019-03-27 DIAGNOSIS — K8689 Other specified diseases of pancreas: Secondary | ICD-10-CM

## 2019-03-27 LAB — GLUCOSE, CAPILLARY: Glucose-Capillary: 128 mg/dL — ABNORMAL HIGH (ref 70–99)

## 2019-03-27 MED ORDER — FLUDEOXYGLUCOSE F - 18 (FDG) INJECTION
7.8000 | Freq: Once | INTRAVENOUS | Status: AC
  Administered 2019-03-27: 7.8 via INTRAVENOUS

## 2019-03-28 ENCOUNTER — Encounter (HOSPITAL_COMMUNITY): Payer: Self-pay | Admitting: *Deleted

## 2019-03-28 ENCOUNTER — Inpatient Hospital Stay (HOSPITAL_COMMUNITY): Payer: BC Managed Care – PPO | Attending: Hematology | Admitting: Hematology

## 2019-03-28 ENCOUNTER — Other Ambulatory Visit (HOSPITAL_COMMUNITY): Payer: Self-pay | Admitting: Hematology

## 2019-03-28 ENCOUNTER — Encounter (HOSPITAL_COMMUNITY): Payer: Self-pay | Admitting: Hematology

## 2019-03-28 VITALS — BP 136/85 | HR 95 | Temp 96.9°F | Resp 18 | Wt 153.3 lb

## 2019-03-28 DIAGNOSIS — J449 Chronic obstructive pulmonary disease, unspecified: Secondary | ICD-10-CM | POA: Insufficient documentation

## 2019-03-28 DIAGNOSIS — Z79899 Other long term (current) drug therapy: Secondary | ICD-10-CM | POA: Diagnosis not present

## 2019-03-28 DIAGNOSIS — C250 Malignant neoplasm of head of pancreas: Secondary | ICD-10-CM | POA: Insufficient documentation

## 2019-03-28 DIAGNOSIS — K8689 Other specified diseases of pancreas: Secondary | ICD-10-CM

## 2019-03-28 DIAGNOSIS — R200 Anesthesia of skin: Secondary | ICD-10-CM | POA: Diagnosis not present

## 2019-03-28 DIAGNOSIS — K7460 Unspecified cirrhosis of liver: Secondary | ICD-10-CM | POA: Diagnosis not present

## 2019-03-28 DIAGNOSIS — C259 Malignant neoplasm of pancreas, unspecified: Secondary | ICD-10-CM | POA: Insufficient documentation

## 2019-03-28 DIAGNOSIS — Z809 Family history of malignant neoplasm, unspecified: Secondary | ICD-10-CM | POA: Insufficient documentation

## 2019-03-28 DIAGNOSIS — R3911 Hesitancy of micturition: Secondary | ICD-10-CM | POA: Insufficient documentation

## 2019-03-28 DIAGNOSIS — E785 Hyperlipidemia, unspecified: Secondary | ICD-10-CM | POA: Insufficient documentation

## 2019-03-28 DIAGNOSIS — I10 Essential (primary) hypertension: Secondary | ICD-10-CM | POA: Insufficient documentation

## 2019-03-28 DIAGNOSIS — R1013 Epigastric pain: Secondary | ICD-10-CM | POA: Diagnosis not present

## 2019-03-28 DIAGNOSIS — F17210 Nicotine dependence, cigarettes, uncomplicated: Secondary | ICD-10-CM | POA: Diagnosis not present

## 2019-03-28 DIAGNOSIS — K219 Gastro-esophageal reflux disease without esophagitis: Secondary | ICD-10-CM | POA: Insufficient documentation

## 2019-03-28 DIAGNOSIS — Z8619 Personal history of other infectious and parasitic diseases: Secondary | ICD-10-CM | POA: Diagnosis not present

## 2019-03-28 DIAGNOSIS — Z801 Family history of malignant neoplasm of trachea, bronchus and lung: Secondary | ICD-10-CM | POA: Diagnosis not present

## 2019-03-28 MED ORDER — HYDROCODONE-ACETAMINOPHEN 5-325 MG PO TABS: 1.0000 | ORAL_TABLET | ORAL | 0 refills | Status: DC | PRN

## 2019-03-28 NOTE — Patient Instructions (Signed)
[place] at [facility] Discharge Instructions  You were seen today by Dr. Delton Coombes. He went over your recent scan results. You will require chemotherapy and then surgery. He will refer you to the surgeon for a consult. He will schedule you for a port-a-cath placement. He will also schedule you for a baseline scan before you start your treatment. He will see you back in for labs and follow up.   Thank you for choosing [place] at [facility] to provide your oncology and hematology care.  To afford each patient quality time with our provider, please arrive at least 15 minutes before your scheduled appointment time.   If you have a lab appointment with the [facility] please come in thru the  Main Entrance and check in at the main information desk  You need to re-schedule your appointment should you arrive 10 or more minutes late.  We strive to give you quality time with our providers, and arriving late affects you and other patients whose appointments are after yours.  Also, if you no show three or more times for appointments you may be dismissed from the clinic at the providers discretion.     Again, thank you for choosing [facility].  Our hope is that these requests will decrease the amount of time that you wait before being seen by our physicians.       _____________________________________________________________  Should you have questions after your visit to [facility], please contact our office at [phone] between the hours of 8:00 a.m. and 4:30 p.m.  Voicemails left after 4:00 p.m. will not be returned until the following business day.  For prescription refill requests, have your pharmacy contact our office and allow 72 hours.    Cancer Center Support Programs:   > Cancer Support Group  2nd Tuesday of the month 1pm-2pm, Journey Room

## 2019-03-28 NOTE — Progress Notes (Signed)
[address], [place] [address]   CLINIC:  Medical Oncology/Hematology  PCP:  [name] [place] [id number] [phone]   REASON FOR VISIT:  Follow-up for pancreatic adenocarcinoma.  CURRENT THERAPY: Under work-up.  BRIEF ONCOLOGIC HISTORY:  Oncology History  Pancreatic cancer ([place])  03/28/2019 Initial Diagnosis   Pancreatic cancer ([place])   03/28/2019 Cancer Staging   Staging form: Exocrine Pancreas, AJCC 8th Edition - Clinical stage from 03/28/2019: Stage III (cT4, cN1, cM0) - Signed by Derek Jack, MD on 03/28/2019      CANCER STAGING: Cancer Staging Pancreatic cancer [facility]) Staging form: Exocrine Pancreas, AJCC 8th Edition - Clinical stage from 03/28/2019: Stage III (cT4, cN1, cM0) - Signed by Derek Jack, MD on 03/28/2019    INTERVAL HISTORY:  Mr. Macbride 64 y.o. male seen for follow-up of pancreatic adenocarcinoma.  He underwent EGD/EUS.  He also underwent PET CT scan.  Reports appetite and energy levels are 50%.  He is continuing to work.  He reports some numbness in the hands and feet.  Also has some urinary hesitation.  Reports abdominal pain in the epigastric region, throbbing type.  Taking hydrocodone which is helping.    REVIEW OF SYSTEMS:  Review of Systems  Gastrointestinal: Positive for abdominal pain and constipation.  Neurological: Positive for numbness.  Psychiatric/Behavioral: Positive for sleep disturbance.  All other systems reviewed and are negative.    PAST MEDICAL/SURGICAL HISTORY:  Past Medical History:  Diagnosis Date  . Aortic atherosclerosis ([place])   . COPD (chronic obstructive pulmonary disease) ([place])    per CT findings 2019  . Family history of lung cancer   . GERD (gastroesophageal reflux disease)   . Hepatitis C    referred to hepatitis clinic 2019 but he never went  . Hyperlipidemia   . Hypertension   . Smoker   . Wears glasses    Past  Surgical History:  Procedure Laterality Date  . ANTERIOR CERVICAL DECOMP/DISCECTOMY FUSION  12/2017   Dr. Consuella Lose  . BIOPSY  03/11/2019   Procedure: BIOPSY;  Surgeon: [name]., MD;  Location: [place];  Service: Gastroenterology;;  . COLONOSCOPY N/A 01/19/2017   Procedure: COLONOSCOPY;  Surgeon: Rogene Houston, MD;  [facility];  Service: Endoscopy;  Laterality: N/A;  930-moved to 9:45 per Lelon Frohlich  . ESOPHAGOGASTRODUODENOSCOPY (EGD) WITH PROPOFOL N/A 03/11/2019   Procedure: ESOPHAGOGASTRODUODENOSCOPY (EGD) WITH PROPOFOL;  Surgeon: Rush Landmark Telford Nab., MD;  Location: [facility] END[facility]COPY;  Service: Gastroenterology;  Laterality: N/A;  . EUS N/A 03/11/2019   Procedure: UPPER ENDOSCOPIC ULTRASOUND (EUS) RADIAL;  Surgeon: Rush Landmark Telford Nab., MD;  Location: [place];  Service: Gastroenterology;  Laterality: N/A;  . FINE NEEDLE ASPIRATION  03/11/2019   Procedure: FINE NEEDLE ASPIRATION (FNA) LINEAR;  Surgeon: [name]., MD;  Location: [place];  Service: Gastroenterology;;  . POLYPECTOMY  01/19/2017   Procedure: POLYPECTOMY;  Surgeon: Rogene Houston, MD;  [facility];  Service: Endoscopy;;  splenic flexure  . POLYPECTOMY  03/11/2019   Procedure: POLYPECTOMY;  Surgeon: Mansouraty, Telford Nab., MD;  Location: [place];  Service: Gastroenterology;;  . Right eye surgery     as a child-removed muscle from leg and put in upper eye lid     SOCIAL HISTORY:  Social History   Socioeconomic History  . Marital status: Widowed    Spouse name: Not on file  . Number of children: Not on file  . Years  of education: Not on file  . Highest education level: Not on file  Occupational History    Employer: [employer]  Tobacco Use  . Smoking status: Current Every Day Smoker    Packs/day: 0.50    Years: 45.00    Pack years: 22.50    Types: Cigarettes  . Smokeless tobacco: Never Used  Substance and Sexual Activity  . Alcohol use:  Not Currently  . Drug use: No  . Sexual activity: Not Currently  Other Topics Concern  . Not on file  Social History Narrative  . Not on file   Social Determinants of Health   Financial Resource Strain: Low Risk   . Difficulty of Paying Living Expenses: Not hard at all  Food Insecurity: No Food Insecurity  . Worried About Charity fundraiser in the Last Year: Never true  . Ran Out of Food in the Last Year: Never true  Transportation Needs: No Transportation Needs  . Lack of Transportation (Medical): No  . Lack of Transportation (Non-Medical): No  Physical Activity: Inactive  . Days of Exercise per Week: 0 days  . Minutes of Exercise per Session: 0 min  Stress: Stress Concern Present  . Feeling of Stress : Rather much  Social Connections: Moderately Isolated  . Frequency of Communication with Friends and Family: Three times a week  . Frequency of Social Gatherings with Friends and Family: Three times a week  . Attends Religious Services: Never  . Active Member of Clubs or Organizations: No  . Attends Archivist Meetings: Never  . Marital Status: Widowed  Intimate Partner Violence: Not At Risk  . Fear of Current or Ex-Partner: No  . Emotionally Abused: No  . Physically Abused: No  . Sexually Abused: No    FAMILY HISTORY:  Family History  Problem Relation Age of Onset  . Cancer Mother   . Other Father        died in his 47s of stomach issues?  . Cancer Brother 70       lung  . Other Sister        GI related death  . Heart disease Neg Hx   . Hypertension Neg Hx   . Stroke Neg Hx   . Colon cancer Neg Hx     CURRENT MEDICATIONS:  Outpatient Encounter Medications as of 03/28/2019  Medication Sig  . lisinopril (ZESTRIL) 20 MG tablet TAKE 1 TABLET BY MOUTH DAILY  . lovastatin (MEVACOR) 20 MG tablet TAKE 1 TABLET BY MOUTH AT BEDTIME  . Multiple Vitamin (MULTIVITAMIN) tablet Take 1 tablet by mouth daily.  [name] omeprazole (PRILOSEC) 40 MG capsule TAKE 1 CAPSULE(40  MG) BY MOUTH DAILY  . HYDROcodone-acetaminophen (NORCO/VICODIN) 5-325 MG tablet Take 1 tablet by mouth every 4 (four) hours as needed for moderate pain.  [name] ondansetron (ZOFRAN ODT) 4 MG disintegrating tablet 26m ODT q4 hours prn nausea/vomit (Patient not taking: Reported on 03/28/2019)  . [DISCONTINUED] HYDROcodone-acetaminophen (NORCO/VICODIN) 5-325 MG tablet Take 1 tablet by mouth every 6 (six) hours as needed for moderate pain. (Patient not taking: Reported on 03/28/2019)  . [DISCONTINUED] HYDROcodone-acetaminophen (NORCO/VICODIN) 5-325 MG tablet Take 1 tablet by mouth every 4 (four) hours as needed for moderate pain.   No facility-administered encounter medications on file as of 03/28/2019.    ALLERGIES:  No Known Allergies   PHYSICAL EXAM:  ECOG Performance status: 1  Vitals:   03/28/19 1519  BP: 136/85  Pulse: 95  Resp: 18  Temp: ([name]  96.9 F (36.1 C)  SpO2: 98%   Filed Weights   03/28/19 1519  Weight: 153 lb 4.8 oz (69.5 kg)    Physical Exam Vitals reviewed.  Constitutional:      Appearance: Normal appearance.  Cardiovascular:     Rate and Rhythm: Normal rate and regular rhythm.     Heart sounds: Normal heart sounds.  Pulmonary:     Effort: Pulmonary effort is normal.     Breath sounds: Normal breath sounds.  Abdominal:     Palpations: Abdomen is soft. There is no mass.  Skin:    General: Skin is warm.  Neurological:     General: No focal deficit present.     Mental Status: He is alert and oriented to person, place, and time.  Psychiatric:        Mood and Affect: Mood normal.        Behavior: Behavior normal.      LABORATORY DATA:  I have reviewed the labs as listed.  CBC    Component Value Date/Time   WBC 7.6 03/23/2019 1546   RBC 4.10 (L) 03/23/2019 1546   HGB 13.6 03/23/2019 1546   HGB 14.7 08/10/2018 0927   HCT 41.4 03/23/2019 1546   HCT 43.9 08/10/2018 0927   PLT  03/23/2019 1546    PLATELET CLUMPS NOTED ON SMEAR, COUNT APPEARS ADEQUATE   PLT  252 08/10/2018 0927   MCV 101.0 (H) 03/23/2019 1546   MCV 101 (H) 08/10/2018 0927   MCH 33.2 03/23/2019 1546   MCHC 32.9 03/23/2019 1546   RDW 12.2 03/23/2019 1546   RDW 12.0 08/10/2018 0927   LYMPHSABS 1.8 03/23/2019 1546   LYMPHSABS 4.5 (H) 08/10/2018 0927   MONOABS 0.5 03/23/2019 1546   EOSABS 0.1 03/23/2019 1546   EOSABS 0.1 08/10/2018 0927   BASOSABS 0.0 03/23/2019 1546   BASOSABS 0.1 08/10/2018 0927   CMP Latest Ref Rng & Units 03/23/2019 03/12/2019 02/13/2019  Glucose 70 - 99 mg/dL 116(H) - -  BUN 8 - 23 mg/dL 26(H) - -  Creatinine 0.61 - 1.24 mg/dL 1.31(H) 0.80 -  Sodium 135 - 145 mmol/L 138 - -  Potassium 3.5 - 5.1 mmol/L 4.4 - -  Chloride 98 - 111 mmol/L 102 - -  CO2 22 - 32 mmol/L 25 - -  Calcium 8.9 - 10.3 mg/dL 9.2 - -  Total Protein 6.5 - 8.1 g/dL 7.9 - 8.6(H)  Total Bilirubin 0.3 - 1.2 mg/dL 1.0 - 1.4(H)  Alkaline Phos 38 - 126 U/L 55 - 52  AST 15 - 41 U/L 37 - 44(H)  ALT 0 - 44 U/L 38 - 42       DIAGNOSTIC IMAGING:  I have independently reviewed the scans and discussed with the patient.     ASSESSMENT & PLAN:   Pancreatic cancer ([place]) 1.  Pancreatic adenocarcinoma (UT4 N1 MX): -Presentation with epigastric pain.  CTAP without contrast from 02/07/2019 showed infiltrative lesion in the neck of the pancreas with small lymph nodes within the gastrohepatic ligament, portacaval lymph node.  Mild nodularity of the liver surface with enlarged left liver compatible with cirrhosis. -EGD/EUS on 03/11/2019 showed no gross lesions on the EGD.  EUS showed a mass in the pancreatic head. -Endoscopic staging is T4 N1 MX.  A few malignant appearing lymph nodes were visualized in the porta hepatis region.  Largest measured 26 mm x 23 mm and was biopsied. -Pathology report indicated adenocarcinoma of the biopsy of the pancreatic head mass as well  as lymph node. -PET scan on 03/28/2019 reviewed by me did not show any metastatic disease. -I have recommended a CT scan of the abdomen  with and without contrast pancreatic protocol for resectability as a baseline. -I have recommended surgical evaluation with Dr. Barry Dienes. -I have recommended at least 4 months of neoadjuvant chemotherapy.  CA 19-9 was 40 on 02/13/2019. -We will also consider germline mutation testing, MSI testing and next generation sequencing. -I have talked to him about FOLFIRINOX chemotherapy.  He has ECOG 1 performance status and is continuing to work.  However he has mild cirrhosis, normal bilirubin and LFTs. -He will also require port placement.  2.  Liver disease: -Recent CT scan showed mild degree of cirrhosis.  He has a history of hepatitis C which was untreated. -However his LFTs, albumin and bilirubin are normal.  3.  Epigastric pain: -He reports throbbing pain in the epigastric region. -I have increased his hydrocodone 5/325 2 every 4 hours as needed. -He was recommended to use stool softener to control constipation.      Orders placed this encounter:  Orders Placed This Encounter  Procedures  . CT ABDOMEN W WO CONTRAST   Total time spent is 40 minutes with more than 50% of the time spent face-to-face discussing pathology report, scan report, treatment plan, counseling and coordination of care.   Derek Jack, MD [place] [phone]

## 2019-03-28 NOTE — Assessment & Plan Note (Addendum)
1.  Pancreatic adenocarcinoma (UT4 N1 MX): -Presentation with epigastric pain.  CTAP without contrast from 02/07/2019 showed infiltrative lesion in the neck of the pancreas with small lymph nodes within the gastrohepatic ligament, portacaval lymph node.  Mild nodularity of the liver surface with enlarged left liver compatible with cirrhosis. -EGD/EUS on 03/11/2019 showed no gross lesions on the EGD.  EUS showed a mass in the pancreatic head. -Endoscopic staging is T4 N1 MX.  A few malignant appearing lymph nodes were visualized in the porta hepatis region.  Largest measured 26 mm x 23 mm and was biopsied. -Pathology report indicated adenocarcinoma of the biopsy of the pancreatic head mass as well as lymph node. -PET scan on 03/28/2019 reviewed by me did not show any metastatic disease. -I have recommended a CT scan of the abdomen with and without contrast pancreatic protocol for resectability as a baseline. -I have recommended surgical evaluation with Dr. Barry Dienes. -I have recommended at least 4 months of neoadjuvant chemotherapy.  CA 19-9 was 40 on 02/13/2019. -We will also consider germline mutation testing, MSI testing and next generation sequencing. -I have talked to him about FOLFIRINOX chemotherapy.  He has ECOG 1 performance status and is continuing to work.  However he has mild cirrhosis, normal bilirubin and LFTs.  He has some numbness in the hands and feet. -He will also require port placement.  2.  Liver disease: -Recent CT scan showed mild degree of cirrhosis.  He has a history of hepatitis C which was untreated. -However his LFTs, albumin and bilirubin are normal.  3.  Epigastric pain: -He reports throbbing pain in the epigastric region. -I have increased his hydrocodone 5/325 2 every 4 hours as needed. -He was recommended to use stool softener to control constipation.

## 2019-03-29 ENCOUNTER — Other Ambulatory Visit (HOSPITAL_COMMUNITY): Payer: Self-pay | Admitting: *Deleted

## 2019-03-29 ENCOUNTER — Encounter (HOSPITAL_COMMUNITY): Payer: Self-pay | Admitting: Lab

## 2019-03-29 DIAGNOSIS — C259 Malignant neoplasm of pancreas, unspecified: Secondary | ICD-10-CM

## 2019-03-29 NOTE — Progress Notes (Unsigned)
Referral sent to [facility] Dr Barry Dienes.  Records faxed on 3/26

## 2019-04-01 ENCOUNTER — Ambulatory Visit (HOSPITAL_COMMUNITY): Payer: BC Managed Care – PPO | Admitting: Hematology

## 2019-04-02 ENCOUNTER — Other Ambulatory Visit: Payer: Self-pay

## 2019-04-02 ENCOUNTER — Ambulatory Visit (INDEPENDENT_AMBULATORY_CARE_PROVIDER_SITE_OTHER): Payer: BC Managed Care – PPO | Admitting: General Surgery

## 2019-04-02 ENCOUNTER — Encounter: Payer: Self-pay | Admitting: General Surgery

## 2019-04-02 VITALS — BP 165/87 | HR 61 | Temp 97.6°F | Resp 14 | Ht 72.0 in | Wt 157.0 lb

## 2019-04-02 DIAGNOSIS — C259 Malignant neoplasm of pancreas, unspecified: Secondary | ICD-10-CM | POA: Diagnosis not present

## 2019-04-02 NOTE — Patient Instructions (Signed)

## 2019-04-03 NOTE — H&P (Signed)
Duane Alexander; [id number]; [birth year]   HPI Patient is a 64 year old white male who was referred to my care by Dr. Delton Coombes of oncology for Port-A-Cath insertion.  Patient is about to undergo chemotherapy for pancreatic carcinoma.  He currently has no complaints. Past Medical History:  Diagnosis Date  . Aortic atherosclerosis ([place])   . COPD (chronic obstructive pulmonary disease) ([place])    per CT findings 2019  . Family history of lung cancer   . GERD (gastroesophageal reflux disease)   . Hepatitis C    referred to hepatitis clinic 2019 but he never went  . Hyperlipidemia   . Hypertension   . Smoker   . Wears glasses     Past Surgical History:  Procedure Laterality Date  . ANTERIOR CERVICAL DECOMP/DISCECTOMY FUSION  12/2017   Dr. Consuella Lose  . BIOPSY  03/11/2019   Procedure: BIOPSY;  Surgeon: [name]., MD;  Location: [place];  Service: Gastroenterology;;  . COLONOSCOPY N/A 01/19/2017   Procedure: COLONOSCOPY;  Surgeon: Rogene Houston, MD;  [facility];  Service: Endoscopy;  Laterality: N/A;  930-moved to 9:45 per Lelon Frohlich  . ESOPHAGOGASTRODUODENOSCOPY (EGD) WITH PROPOFOL N/A 03/11/2019   Procedure: ESOPHAGOGASTRODUODENOSCOPY (EGD) WITH PROPOFOL;  Surgeon: Rush Landmark Telford Nab., MD;  Location: [facility] END[facility]COPY;  Service: Gastroenterology;  Laterality: N/A;  . EUS N/A 03/11/2019   Procedure: UPPER ENDOSCOPIC ULTRASOUND (EUS) RADIAL;  Surgeon: Rush Landmark Telford Nab., MD;  Location: [place];  Service: Gastroenterology;  Laterality: N/A;  . FINE NEEDLE ASPIRATION  03/11/2019   Procedure: FINE NEEDLE ASPIRATION (FNA) LINEAR;  Surgeon: [name]., MD;  Location: [place];  Service: Gastroenterology;;  . POLYPECTOMY  01/19/2017   Procedure: POLYPECTOMY;  Surgeon: Rogene Houston, MD;  [facility];  Service: Endoscopy;;  splenic flexure  . POLYPECTOMY  03/11/2019   Procedure: POLYPECTOMY;  Surgeon: Mansouraty, Telford Nab., MD;   Location[facility] END[facility]COPY;  Service: Gastroenterology;;  . Right eye surgery     as a child-removed muscle from leg and put in upper eye lid    Family History  Problem Relation Age of Onset  . Cancer Mother   . Other Father        died in his 44s of stomach issues?  . Cancer Brother 62       lung  . Other Sister        GI related death  . Heart disease Neg Hx   . Hypertension Neg Hx   . Stroke Neg Hx   . Colon cancer Neg Hx     Current Outpatient Medications on File Prior to Visit  Medication Sig Dispense Refill  . HYDROcodone-acetaminophen (NORCO/VICODIN) 5-325 MG tablet Take 1 tablet by mouth every 4 (four) hours as needed for moderate pain. 84 tablet 0  . lisinopril (ZESTRIL) 20 MG tablet TAKE 1 TABLET BY MOUTH DAILY (Patient taking differently: Take 20 mg by mouth daily. ) 30 tablet 0  . lovastatin (MEVACOR) 20 MG tablet TAKE 1 TABLET BY MOUTH AT BEDTIME (Patient taking differently: Take 20 mg by mouth at bedtime. ) 30 tablet 0  . Multiple Vitamin (MULTIVITAMIN) tablet Take 1 tablet by mouth daily.     No current facility-administered medications on file prior to visit.    No Known Allergies  Social History   Substance and Sexual Activity  Alcohol Use Not Currently    Social History   Tobacco Use  Smoking Status Current Every Day Smoker  . Packs/day: 0.50  .  Years: 45.00  . Pack years: 22.50  . Types: Cigarettes  Smokeless Tobacco Never Used    Review of Systems  Constitutional: Negative.   HENT: Negative.   Eyes: Positive for pain.  Respiratory: Negative.   Cardiovascular: Negative.   Gastrointestinal: Negative.   Genitourinary: Positive for frequency.  Musculoskeletal: Positive for back pain.  Skin: Negative.   Neurological: Negative.   Endo/Heme/Allergies: Negative.   Psychiatric/Behavioral: Negative.     Objective   Vitals:   04/02/19 0953  BP: (!) 165/87  Pulse: 61  Resp: 14  Temp: 97.6 F (36.4 C)  SpO2: 99%    Physical Exam Vitals  reviewed.  Constitutional:      Appearance: Normal appearance. He is not ill-appearing.  HENT:     Head: Normocephalic and atraumatic.  Cardiovascular:     Rate and Rhythm: Normal rate and regular rhythm.     Heart sounds: Normal heart sounds. No murmur. No friction rub. No gallop.   Pulmonary:     Effort: Pulmonary effort is normal. No respiratory distress.     Breath sounds: Normal breath sounds. No stridor. No wheezing, rhonchi or rales.  Skin:    General: Skin is warm and dry.  Neurological:     Mental Status: He is alert and oriented to person, place, and time.   Oncology notes reviewed  Assessment  Pancreatic carcinoma, need for central venous access Plan   Patient is scheduled for Port-A-Cath insertion on 04/15/2019.  The risks and benefits of the procedure including bleeding, infection, and pneumothorax were fully explained to the patient, who gave informed consent.

## 2019-04-03 NOTE — Progress Notes (Signed)
Duane Alexander; [id number]; 11-07-1955   HPI Patient is a 64 year old white male who was referred to my care by Dr. Delton Coombes of oncology for Port-A-Cath insertion.  Patient is about to undergo chemotherapy for pancreatic carcinoma.  He currently has no complaints. Past Medical History:  Diagnosis Date  . Aortic atherosclerosis ([place])   . COPD (chronic obstructive pulmonary disease) ([place])    per CT findings 2019  . Family history of lung cancer   . GERD (gastroesophageal reflux disease)   . Hepatitis C    referred to hepatitis clinic 2019 but he never went  . Hyperlipidemia   . Hypertension   . Smoker   . Wears glasses     Past Surgical History:  Procedure Laterality Date  . ANTERIOR CERVICAL DECOMP/DISCECTOMY FUSION  12/2017   Dr. Consuella Lose  . BIOPSY  03/11/2019   Procedure: BIOPSY;  Surgeon: [name]., MD;  Location: [place];  Service: Gastroenterology;;  . COLONOSCOPY N/A 01/19/2017   Procedure: COLONOSCOPY;  Surgeon: Rogene Houston, MD;  [facility];  Service: Endoscopy;  Laterality: N/A;  930-moved to 9:45 per Lelon Frohlich  . ESOPHAGOGASTRODUODENOSCOPY (EGD) WITH PROPOFOL N/A 03/11/2019   Procedure: ESOPHAGOGASTRODUODENOSCOPY (EGD) WITH PROPOFOL;  Surgeon: Rush Landmark Telford Nab., MD;  Location: [facility] ENDOSCOPY;  Service: Gastroenterology;  Laterality: N/A;  . EUS N/A 03/11/2019   Procedure: UPPER ENDOSCOPIC ULTRASOUND (EUS) RADIAL;  Surgeon: Rush Landmark Telford Nab., MD;  Location: [place];  Service: Gastroenterology;  Laterality: N/A;  . FINE NEEDLE ASPIRATION  03/11/2019   Procedure: FINE NEEDLE ASPIRATION (FNA) LINEAR;  Surgeon: [name]., MD;  Location: [place];  Service: Gastroenterology;;  . POLYPECTOMY  01/19/2017   Procedure: POLYPECTOMY;  Surgeon: Rogene Houston, MD;  [facility];  Service: Endoscopy;;  splenic flexure  . POLYPECTOMY  03/11/2019   Procedure: POLYPECTOMY;  Surgeon: Mansouraty, Telford Nab., MD;   Location: [facility] ENDOSCOPY;  Service: Gastroenterology;;  . Right eye surgery     as a child-removed muscle from leg and put in upper eye lid    Family History  Problem Relation Age of Onset  . Cancer Mother   . Other Father        died in his 73s of stomach issues?  . Cancer Brother 75       lung  . Other Sister        GI related death  . Heart disease Neg Hx   . Hypertension Neg Hx   . Stroke Neg Hx   . Colon cancer Neg Hx     Current Outpatient Medications on File Prior to Visit  Medication Sig Dispense Refill  . HYDROcodone-acetaminophen (NORCO/VICODIN) 5-325 MG tablet Take 1 tablet by mouth every 4 (four) hours as needed for moderate pain. 84 tablet 0  . lisinopril (ZESTRIL) 20 MG tablet TAKE 1 TABLET BY MOUTH DAILY (Patient taking differently: Take 20 mg by mouth daily. ) 30 tablet 0  . lovastatin (MEVACOR) 20 MG tablet TAKE 1 TABLET BY MOUTH AT BEDTIME (Patient taking differently: Take 20 mg by mouth at bedtime. ) 30 tablet 0  . Multiple Vitamin (MULTIVITAMIN) tablet Take 1 tablet by mouth daily.     No current facility-administered medications on file prior to visit.    No Known Allergies  Social History   Substance and Sexual Activity  Alcohol Use Not Currently    Social History   Tobacco Use  Smoking Status Current Every Day Smoker  . Packs/day: 0.50  .  Years: 45.00  . Pack years: 22.50  . Types: Cigarettes  Smokeless Tobacco Never Used    Review of Systems  Constitutional: Negative.   HENT: Negative.   Eyes: Positive for pain.  Respiratory: Negative.   Cardiovascular: Negative.   Gastrointestinal: Negative.   Genitourinary: Positive for frequency.  Musculoskeletal: Positive for back pain.  Skin: Negative.   Neurological: Negative.   Endo/Heme/Allergies: Negative.   Psychiatric/Behavioral: Negative.     Objective   Vitals:   04/02/19 0953  BP: (!) 165/87  Pulse: 61  Resp: 14  Temp: 97.6 F (36.4 C)  SpO2: 99%    Physical Exam Vitals  reviewed.  Constitutional:      Appearance: Normal appearance. He is not ill-appearing.  HENT:     Head: Normocephalic and atraumatic.  Cardiovascular:     Rate and Rhythm: Normal rate and regular rhythm.     Heart sounds: Normal heart sounds. No murmur. No friction rub. No gallop.   Pulmonary:     Effort: Pulmonary effort is normal. No respiratory distress.     Breath sounds: Normal breath sounds. No stridor. No wheezing, rhonchi or rales.  Skin:    General: Skin is warm and dry.  Neurological:     Mental Status: He is alert and oriented to person, place, and time.   Oncology notes reviewed  Assessment  Pancreatic carcinoma, need for central venous access Plan   Patient is scheduled for Port-A-Cath insertion on 04/15/2019.  The risks and benefits of the procedure including bleeding, infection, and pneumothorax were fully explained to the patient, who gave informed consent.

## 2019-04-10 ENCOUNTER — Encounter (HOSPITAL_COMMUNITY)
Admission: RE | Admit: 2019-04-10 | Discharge: 2019-04-10 | Disposition: A | Discharge status: Home / Self Care | Payer: BC Managed Care – PPO | Source: Ambulatory Visit | Attending: General Surgery | Admitting: General Surgery

## 2019-04-10 ENCOUNTER — Other Ambulatory Visit: Payer: Self-pay

## 2019-04-10 ENCOUNTER — Encounter (HOSPITAL_COMMUNITY): Payer: Self-pay

## 2019-04-11 ENCOUNTER — Ambulatory Visit (HOSPITAL_COMMUNITY): Payer: BC Managed Care – PPO

## 2019-04-11 ENCOUNTER — Inpatient Hospital Stay (HOSPITAL_COMMUNITY): Payer: BC Managed Care – PPO | Attending: Hematology | Admitting: Genetic Counselor

## 2019-04-11 ENCOUNTER — Other Ambulatory Visit (HOSPITAL_COMMUNITY): Payer: Self-pay | Admitting: *Deleted

## 2019-04-11 ENCOUNTER — Inpatient Hospital Stay (HOSPITAL_COMMUNITY): Payer: BC Managed Care – PPO

## 2019-04-11 ENCOUNTER — Encounter (HOSPITAL_COMMUNITY): Payer: Self-pay | Admitting: *Deleted

## 2019-04-11 DIAGNOSIS — Z808 Family history of malignant neoplasm of other organs or systems: Secondary | ICD-10-CM | POA: Insufficient documentation

## 2019-04-11 DIAGNOSIS — K5900 Constipation, unspecified: Secondary | ICD-10-CM | POA: Insufficient documentation

## 2019-04-11 DIAGNOSIS — Z8041 Family history of malignant neoplasm of ovary: Secondary | ICD-10-CM | POA: Insufficient documentation

## 2019-04-11 DIAGNOSIS — Z5189 Encounter for other specified aftercare: Secondary | ICD-10-CM | POA: Insufficient documentation

## 2019-04-11 DIAGNOSIS — Z801 Family history of malignant neoplasm of trachea, bronchus and lung: Secondary | ICD-10-CM | POA: Insufficient documentation

## 2019-04-11 DIAGNOSIS — K769 Liver disease, unspecified: Secondary | ICD-10-CM | POA: Insufficient documentation

## 2019-04-11 DIAGNOSIS — Z5111 Encounter for antineoplastic chemotherapy: Secondary | ICD-10-CM | POA: Insufficient documentation

## 2019-04-11 DIAGNOSIS — Z800 Family history of malignant neoplasm of digestive organs: Secondary | ICD-10-CM | POA: Insufficient documentation

## 2019-04-11 DIAGNOSIS — Z8051 Family history of malignant neoplasm of kidney: Secondary | ICD-10-CM | POA: Insufficient documentation

## 2019-04-11 DIAGNOSIS — C250 Malignant neoplasm of head of pancreas: Secondary | ICD-10-CM

## 2019-04-11 DIAGNOSIS — R1013 Epigastric pain: Secondary | ICD-10-CM | POA: Insufficient documentation

## 2019-04-11 DIAGNOSIS — R202 Paresthesia of skin: Secondary | ICD-10-CM | POA: Insufficient documentation

## 2019-04-11 DIAGNOSIS — F17210 Nicotine dependence, cigarettes, uncomplicated: Secondary | ICD-10-CM | POA: Insufficient documentation

## 2019-04-11 DIAGNOSIS — Z806 Family history of leukemia: Secondary | ICD-10-CM

## 2019-04-11 DIAGNOSIS — Z8042 Family history of malignant neoplasm of prostate: Secondary | ICD-10-CM | POA: Insufficient documentation

## 2019-04-11 DIAGNOSIS — Z79899 Other long term (current) drug therapy: Secondary | ICD-10-CM | POA: Insufficient documentation

## 2019-04-11 MED ORDER — HYDROCODONE-ACETAMINOPHEN 5-325 MG PO TABS: 1.0000 | ORAL_TABLET | ORAL | 0 refills | Status: DC | PRN

## 2019-04-12 ENCOUNTER — Encounter (HOSPITAL_COMMUNITY): Payer: Self-pay

## 2019-04-12 ENCOUNTER — Encounter (HOSPITAL_COMMUNITY): Payer: Self-pay | Admitting: *Deleted

## 2019-04-12 ENCOUNTER — Other Ambulatory Visit: Payer: Self-pay

## 2019-04-12 ENCOUNTER — Encounter (HOSPITAL_COMMUNITY): Payer: Self-pay | Admitting: Genetic Counselor

## 2019-04-12 ENCOUNTER — Other Ambulatory Visit (HOSPITAL_COMMUNITY)
Admission: RE | Admit: 2019-04-12 | Discharge: 2019-04-12 | Disposition: A | Discharge status: Home / Self Care | Payer: BC Managed Care – PPO | Source: Ambulatory Visit | Attending: General Surgery | Admitting: General Surgery

## 2019-04-12 DIAGNOSIS — Z20822 Contact with and (suspected) exposure to covid-19: Secondary | ICD-10-CM | POA: Insufficient documentation

## 2019-04-12 DIAGNOSIS — Z8051 Family history of malignant neoplasm of kidney: Secondary | ICD-10-CM | POA: Insufficient documentation

## 2019-04-12 DIAGNOSIS — Z800 Family history of malignant neoplasm of digestive organs: Secondary | ICD-10-CM | POA: Insufficient documentation

## 2019-04-12 DIAGNOSIS — Z01812 Encounter for preprocedural laboratory examination: Secondary | ICD-10-CM | POA: Diagnosis not present

## 2019-04-12 DIAGNOSIS — Z8042 Family history of malignant neoplasm of prostate: Secondary | ICD-10-CM | POA: Insufficient documentation

## 2019-04-12 DIAGNOSIS — Z808 Family history of malignant neoplasm of other organs or systems: Secondary | ICD-10-CM | POA: Insufficient documentation

## 2019-04-12 DIAGNOSIS — Z806 Family history of leukemia: Secondary | ICD-10-CM | POA: Insufficient documentation

## 2019-04-12 DIAGNOSIS — Z8041 Family history of malignant neoplasm of ovary: Secondary | ICD-10-CM | POA: Insufficient documentation

## 2019-04-13 LAB — SARS CORONAVIRUS 2 (TAT 6-24 HRS): SARS Coronavirus 2: NEGATIVE

## 2019-04-15 ENCOUNTER — Ambulatory Visit (HOSPITAL_COMMUNITY): Payer: BC Managed Care – PPO | Admitting: Anesthesiology

## 2019-04-15 ENCOUNTER — Encounter (HOSPITAL_COMMUNITY): Payer: Self-pay | Admitting: Genetic Counselor

## 2019-04-15 ENCOUNTER — Encounter (HOSPITAL_COMMUNITY): Payer: Self-pay | Admitting: General Surgery

## 2019-04-15 ENCOUNTER — Ambulatory Visit (HOSPITAL_COMMUNITY)
Admission: RE | Admit: 2019-04-15 | Discharge: 2019-04-15 | Disposition: A | Discharge status: Home / Self Care | Payer: BC Managed Care – PPO | Attending: General Surgery | Admitting: General Surgery

## 2019-04-15 ENCOUNTER — Encounter (HOSPITAL_COMMUNITY)
Admission: RE | Disposition: A | Discharge status: Home / Self Care | Payer: Self-pay | Source: Home / Self Care | Attending: General Surgery

## 2019-04-15 ENCOUNTER — Ambulatory Visit (HOSPITAL_COMMUNITY): Payer: BC Managed Care – PPO

## 2019-04-15 DIAGNOSIS — B1920 Unspecified viral hepatitis C without hepatic coma: Secondary | ICD-10-CM | POA: Diagnosis not present

## 2019-04-15 DIAGNOSIS — J449 Chronic obstructive pulmonary disease, unspecified: Secondary | ICD-10-CM | POA: Diagnosis not present

## 2019-04-15 DIAGNOSIS — Z809 Family history of malignant neoplasm, unspecified: Secondary | ICD-10-CM | POA: Insufficient documentation

## 2019-04-15 DIAGNOSIS — I10 Essential (primary) hypertension: Secondary | ICD-10-CM | POA: Insufficient documentation

## 2019-04-15 DIAGNOSIS — C259 Malignant neoplasm of pancreas, unspecified: Secondary | ICD-10-CM | POA: Diagnosis not present

## 2019-04-15 DIAGNOSIS — Z79899 Other long term (current) drug therapy: Secondary | ICD-10-CM | POA: Insufficient documentation

## 2019-04-15 DIAGNOSIS — Z95828 Presence of other vascular implants and grafts: Secondary | ICD-10-CM

## 2019-04-15 DIAGNOSIS — F17210 Nicotine dependence, cigarettes, uncomplicated: Secondary | ICD-10-CM | POA: Diagnosis not present

## 2019-04-15 DIAGNOSIS — K219 Gastro-esophageal reflux disease without esophagitis: Secondary | ICD-10-CM | POA: Diagnosis not present

## 2019-04-15 DIAGNOSIS — Z801 Family history of malignant neoplasm of trachea, bronchus and lung: Secondary | ICD-10-CM | POA: Diagnosis not present

## 2019-04-15 DIAGNOSIS — Z452 Encounter for adjustment and management of vascular access device: Secondary | ICD-10-CM | POA: Diagnosis not present

## 2019-04-15 DIAGNOSIS — E785 Hyperlipidemia, unspecified: Secondary | ICD-10-CM | POA: Diagnosis not present

## 2019-04-15 DIAGNOSIS — K7460 Unspecified cirrhosis of liver: Secondary | ICD-10-CM | POA: Diagnosis not present

## 2019-04-15 HISTORY — PX: PORTACATH PLACEMENT: SHX2246

## 2019-04-15 SURGERY — INSERTION, TUNNELED CENTRAL VENOUS DEVICE, WITH PORT
Anesthesia: Monitor Anesthesia Care | Site: Chest | Laterality: Left

## 2019-04-15 MED ORDER — CHLORHEXIDINE GLUCONATE CLOTH 2 % EX PADS: 6.0000 | MEDICATED_PAD | Freq: Once | CUTANEOUS | Status: DC

## 2019-04-15 MED ORDER — HEPARIN SOD (PORK) LOCK FLUSH 100 UNIT/ML IV SOLN
INTRAVENOUS | Status: DC | PRN
  Administered 2019-04-15: 500 [IU]

## 2019-04-15 MED ORDER — MEPERIDINE HCL 50 MG/ML IJ SOLN: 6.2500 mg | INTRAMUSCULAR | Status: DC | PRN

## 2019-04-15 MED ORDER — CEFAZOLIN SODIUM-DEXTROSE 2-4 GM/100ML-% IV SOLN
INTRAVENOUS | Status: AC
  Filled 2019-04-15: qty 100

## 2019-04-15 MED ORDER — LACTATED RINGERS IV SOLN: Freq: Once | INTRAVENOUS | Status: AC

## 2019-04-15 MED ORDER — PROPOFOL 500 MG/50ML IV EMUL
INTRAVENOUS | Status: DC | PRN
  Administered 2019-04-15: 150 ug/kg/min via INTRAVENOUS

## 2019-04-15 MED ORDER — LACTATED RINGERS IV SOLN: INTRAVENOUS | Status: DC | PRN

## 2019-04-15 MED ORDER — FENTANYL CITRATE (PF) 100 MCG/2ML IJ SOLN
INTRAMUSCULAR | Status: DC | PRN
  Administered 2019-04-15 (×2): 50 ug via INTRAVENOUS

## 2019-04-15 MED ORDER — FENTANYL CITRATE (PF) 100 MCG/2ML IJ SOLN
INTRAMUSCULAR | Status: AC
  Filled 2019-04-15: qty 2

## 2019-04-15 MED ORDER — LIDOCAINE HCL (PF) 1 % IJ SOLN
INTRAMUSCULAR | Status: DC | PRN
  Administered 2019-04-15: 8 mL

## 2019-04-15 MED ORDER — MIDAZOLAM HCL 2 MG/2ML IJ SOLN
INTRAMUSCULAR | Status: AC
  Filled 2019-04-15: qty 2

## 2019-04-15 MED ORDER — CEFAZOLIN SODIUM-DEXTROSE 2-4 GM/100ML-% IV SOLN
2.0000 g | INTRAVENOUS | Status: AC
  Administered 2019-04-15: 2 g via INTRAVENOUS

## 2019-04-15 MED ORDER — PROPOFOL 10 MG/ML IV BOLUS
INTRAVENOUS | Status: AC
  Filled 2019-04-15: qty 20

## 2019-04-15 MED ORDER — MIDAZOLAM HCL 5 MG/5ML IJ SOLN
INTRAMUSCULAR | Status: DC | PRN
  Administered 2019-04-15: 2 mg via INTRAVENOUS

## 2019-04-15 MED ORDER — SODIUM CHLORIDE (PF) 0.9 % IJ SOLN
INTRAMUSCULAR | Status: DC | PRN
  Administered 2019-04-15: 200 mL

## 2019-04-15 MED ORDER — HYDROMORPHONE HCL 1 MG/ML IJ SOLN: 0.2500 mg | INTRAMUSCULAR | Status: DC | PRN

## 2019-04-15 MED ORDER — HEPARIN SOD (PORK) LOCK FLUSH 100 UNIT/ML IV SOLN
INTRAVENOUS | Status: AC
  Filled 2019-04-15: qty 5

## 2019-04-15 MED ORDER — ONDANSETRON HCL 4 MG/2ML IJ SOLN: 4.0000 mg | Freq: Once | INTRAMUSCULAR | Status: DC | PRN

## 2019-04-15 MED ORDER — LIDOCAINE HCL (PF) 1 % IJ SOLN
INTRAMUSCULAR | Status: AC
  Filled 2019-04-15: qty 30

## 2019-04-15 MED ORDER — ONDANSETRON HCL 4 MG/2ML IJ SOLN
INTRAMUSCULAR | Status: DC | PRN
  Administered 2019-04-15: 4 mg via INTRAVENOUS

## 2019-04-15 MED ORDER — KETOROLAC TROMETHAMINE 30 MG/ML IJ SOLN
INTRAMUSCULAR | Status: DC | PRN
  Administered 2019-04-15: 30 mg via INTRAVENOUS

## 2019-04-15 SURGICAL SUPPLY — 31 items
ADH SKN CLS APL DERMABOND .7 (GAUZE/BANDAGES/DRESSINGS) ×1
APL PRP STRL LF ISPRP CHG 10.5 (MISCELLANEOUS) ×1
APPLICATOR CHLORAPREP 10.5 ORG (MISCELLANEOUS) ×2 IMPLANT
BAG DECANTER FOR FLEXI CONT (MISCELLANEOUS) ×2 IMPLANT
CLOTH BEACON ORANGE TIMEOUT ST (SAFETY) ×2 IMPLANT
COVER LIGHT HANDLE STERIS (MISCELLANEOUS) ×4 IMPLANT
COVER WAND RF STERILE (DRAPES) ×2 IMPLANT
DECANTER SPIKE VIAL GLASS SM (MISCELLANEOUS) ×2 IMPLANT
DERMABOND ADVANCED (GAUZE/BANDAGES/DRESSINGS) ×1
DERMABOND ADVANCED .7 DNX12 (GAUZE/BANDAGES/DRESSINGS) ×1 IMPLANT
DRAPE C-ARM FOLDED MOBILE STRL (DRAPES) ×2 IMPLANT
ELECT REM PT RETURN 9FT ADLT (ELECTROSURGICAL) ×2
ELECTRODE REM PT RTRN 9FT ADLT (ELECTROSURGICAL) ×1 IMPLANT
GLOVE BIOGEL PI IND STRL 7.0 (GLOVE) ×2 IMPLANT
GLOVE BIOGEL PI INDICATOR 7.0 (GLOVE) ×2
GLOVE SURG SS PI 7.5 STRL IVOR (GLOVE) ×2 IMPLANT
GOWN STRL REUS W/TWL LRG LVL3 (GOWN DISPOSABLE) ×4 IMPLANT
IV NS 500ML (IV SOLUTION) ×2
IV NS 500ML BAXH (IV SOLUTION) ×1 IMPLANT
KIT PORT POWER 8FR ISP MRI (Port) ×2 IMPLANT
KIT TURNOVER KIT A (KITS) ×2 IMPLANT
NDL HYPO 25X1 1.5 SAFETY (NEEDLE) ×1 IMPLANT
NEEDLE HYPO 25X1 1.5 SAFETY (NEEDLE) ×2 IMPLANT
PACK MINOR (CUSTOM PROCEDURE TRAY) ×2 IMPLANT
PAD ARMBOARD 7.5X6 YLW CONV (MISCELLANEOUS) ×2 IMPLANT
SET BASIN LINEN APH (SET/KITS/TRAYS/PACK) ×2 IMPLANT
SUT MNCRL AB 4-0 PS2 18 (SUTURE) ×2 IMPLANT
SUT VIC AB 3-0 SH 27 (SUTURE) ×2
SUT VIC AB 3-0 SH 27X BRD (SUTURE) ×1 IMPLANT
SYR 5ML LL (SYRINGE) ×2 IMPLANT
SYR CONTROL 10ML LL (SYRINGE) ×2 IMPLANT

## 2019-04-15 NOTE — Progress Notes (Signed)
REFERRING PROVIDER: Derek Jack, MD [address] [place],  [place] [address]  PRIMARY PROVIDER:  Carlena Hurl, PA-C  PRIMARY REASON FOR VISIT:  1. Malignant neoplasm of head of pancreas ([place])   2. Family history of ovarian cancer   3. Family history of leukemia   4. Family history of brain cancer   5. Family history of stomach cancer   6. Family history of colon cancer   7. Family history of kidney cancer   8. Family history of prostate cancer   9. Family history of lung cancer     I connected with Duane Alexander on 04/11/2019 at 2:00 pm EDT by video conference and verified that I am speaking with the correct person using two identifiers.   Patient location: [facility] Provider location: [facility] office  HISTORY OF PRESENT ILLNESS:   Duane Alexander, a 64 y.o. male, was seen for a [place] cancer genetics consultation, accompanied by his sister Mechele Claude, at the request of Dr. Delton Coombes due to a personal history of pancreatic cancer and a family history of cancer.  Duane Alexander presents to clinic today to discuss the possibility of a hereditary predisposition to cancer, genetic testing, and to further clarify his future cancer risks, as well as potential cancer risks for family members.   In March 2021, at the age of 72, Duane Alexander was diagnosed with pancreatic cancer. The treatment plan includes neoadjuvant chemotherapy and surgical evaluation.   CANCER HISTORY:  Oncology History  Pancreatic cancer ([place])  03/28/2019 Initial Diagnosis   Pancreatic cancer ([place])   03/28/2019 Cancer Staging   Staging form: Exocrine Pancreas, AJCC 8th Edition - Clinical stage from 03/28/2019: Stage III (cT4, cN1, cM0) - Signed by Derek Jack, MD on 03/28/2019     Past Medical History:  Diagnosis Date  . Aortic atherosclerosis ([place])   . COPD (chronic obstructive pulmonary disease) ([place])    per CT findings 2019  . Family history of brain cancer   . Family history of  colon cancer   . Family history of kidney cancer   . Family history of leukemia   . Family history of lung cancer   . Family history of ovarian cancer   . Family history of prostate cancer   . Family history of stomach cancer   . GERD (gastroesophageal reflux disease)   . Hepatitis C    referred to hepatitis clinic 2019 but he never went  . Hyperlipidemia   . Hypertension   . Smoker   . Wears glasses     Past Surgical History:  Procedure Laterality Date  . ANTERIOR CERVICAL DECOMP/DISCECTOMY FUSION  12/2017   Dr. Consuella Lose  . BIOPSY  03/11/2019   Procedure: BIOPSY;  Surgeon: [name]., MD;  Location: [place];  Service: Gastroenterology;;  . COLONOSCOPY N/A 01/19/2017   Procedure: COLONOSCOPY;  Surgeon: Rogene Houston, MD;  [facility];  Service: Endoscopy;  Laterality: N/A;  930-moved to 9:45 per Lelon Frohlich  . ESOPHAGOGASTRODUODENOSCOPY (EGD) WITH PROPOFOL N/A 03/11/2019   Procedure: ESOPHAGOGASTRODUODENOSCOPY (EGD) WITH PROPOFOL;  Surgeon: Rush Landmark Telford Nab., MD;  Location: [facility];  Service: Gastroenterology;  Laterality: N/A;  . EUS N/A 03/11/2019   Procedure: UPPER ENDOSCOPIC ULTRASOUND (EUS) RADIAL;  Surgeon: Rush Landmark Telford Nab., MD;  Location: [place];  Service: Gastroenterology;  Laterality: N/A;  . FINE NEEDLE ASPIRATION  03/11/2019   Procedure: FINE NEEDLE ASPIRATION (FNA) LINEAR;  Surgeon: [name]., MD;  Location: [place];  Service: Gastroenterology;;  . POLYPECTOMY  01/19/2017   Procedure: POLYPECTOMY;  Surgeon: Rogene Houston, MD;  [facility];  Service: Endoscopy;;  splenic flexure  . POLYPECTOMY  03/11/2019   Procedure: POLYPECTOMY;  Surgeon: Mansouraty, Telford Nab., MD;  Location: [place];  Service: Gastroenterology;;  . Right eye surgery     as a child-removed muscle from leg and put in upper eye lid    Social History   Socioeconomic History  . Marital status: Widowed    Spouse  name: Not on file  . Number of children: Not on file  . Years of education: Not on file  . Highest education level: Not on file  Occupational History    Employer: [employer]  Tobacco Use  . Smoking status: Current Every Day Smoker    Packs/day: 0.50    Years: 45.00    Pack years: 22.50    Types: Cigarettes  . Smokeless tobacco: Never Used  Substance and Sexual Activity  . Alcohol use: Not Currently  . Drug use: No  . Sexual activity: Not Currently  Other Topics Concern  . Not on file  Social History Narrative  . Not on file   Social Determinants of Health   Financial Resource Strain: Low Risk   . Difficulty of Paying Living Expenses: Not hard at all  Food Insecurity: No Food Insecurity  . Worried About Charity fundraiser in the Last Year: Never true  . Ran Out of Food in the Last Year: Never true  Transportation Needs: No Transportation Needs  . Lack of Transportation (Medical): No  . Lack of Transportation (Non-Medical): No  Physical Activity: Inactive  . Days of Exercise per Week: 0 days  . Minutes of Exercise per Session: 0 min  Stress: Stress Concern Present  . Feeling of Stress : Rather much  Social Connections: Moderately Isolated  . Frequency of Communication with Friends and Family: Three times a week  . Frequency of Social Gatherings with Friends and Family: Three times a week  . Attends Religious Services: Never  . Active Member of Clubs or Organizations: No  . Attends Archivist Meetings: Never  . Marital Status: Widowed     FAMILY HISTORY:  We obtained a detailed, 4-generation family history.  Significant diagnoses are listed below: Family History  Problem Relation Age of Onset  . Throat cancer Father 65       died in his 55s of stomach issues?  . Lung cancer Brother 68       smoker, non-small cell  . Ovarian cancer Sister 11       GI related death  . Heart attack Maternal Grandmother   . Brain cancer Maternal Aunt        dx. in  her 86s  . Lung cancer Maternal Uncle        dx. in his 7s-60s, smoker  . Stomach cancer Maternal Aunt        dx. in her 46s  . Colon cancer Cousin        dx. >50 - maternal cousin  . Kidney cancer Cousin        dx. in his 33s - maternal cousin  . Prostate cancer Cousin        dx. in his 41s - maternal cousin  . Leukemia Nephew 34  . Heart disease Neg Hx   . Hypertension Neg Hx   . Stroke Neg Hx    Duane Alexander has no  children. He has three sisters and one brother. One sister died at the age of 47 from ovarian cancer that was diagnosed at 85. One brother died at the age of 17 from non-small cell lung cancer that had been diagnosed at age 70. This brother was a smoker. He also has a nephew who died from leukemia at the age of 76.   Duane Alexander's mother died at the age of 36 and did not have cancer. He had three maternal aunts and four or five maternal uncles. One aunt died from brain cancer in her 61s. Another aunt was diagnosed with stomach cancer in her 53s and later died from being shot. One uncle died in his 30s or 21s from lung cancer and was a smoker. Duane Alexander has three maternal cousins who had cancer - one had colon cancer diagnosed when he was older than 65, a second had kidney cancer diagnosed in his 9s, and a third had prostate cancer diagnosed at the age of 60. Duane Alexander's maternal grandmother died in her 17s from a heart attack, and he is unsure how old his maternal grandfather was when he died. There are no other known diagnoses of cancer on the maternal side of the family.  Duane Alexander's father died at the age of 33 from throat cancer and was a smoker. Duane Alexander's father had a twin brother who has not had cancer to his knowledge. He does not know how old his paternal grandparents were when they died, or if there are others on the paternal side of the family who have had cancer.  Duane Alexander is unaware of previous family history of genetic testing for  hereditary cancer risks. Patient's maternal ancestors are of [place] and European descent, and paternal ancestors are of [place] descent. There is no reported Ashkenazi Jewish ancestry. There is no known consanguinity.  GENETIC COUNSELING ASSESSMENT: Duane Alexander is a 64 y.o. male with a personal history of pancreatic cancer and a family history of cancer, which is somewhat suggestive of a hereditary cancer syndrome and predisposition to cancer. We, therefore, discussed and recommended the following at today's visit.   DISCUSSION: We discussed that approximately 5-10% of cancer is hereditary. Most cases of hereditary pancreatic cancer associated with the BRCA1 and BRCA2 genes.  There are other genes that can be associated with hereditary pancreatic cancer syndromes.  These include CDKN2A, APC, ATM, the Lynch syndrome genes, etc.  We discussed that testing is beneficial for several reasons, including identifying whether potential treatment options such as PARP inhibitors would be beneficial in the future, knowing about other cancer risks, identifying potential screening and risk-reduction options that may be appropriate, and to understand if other family members could be at risk for cancer and allow them to undergo genetic testing.  We reviewed the characteristics, features and inheritance patterns of hereditary cancer syndromes. We also discussed genetic testing, including the appropriate family members to test, the process of testing, insurance coverage, genetic discrimination, and turn-around-time for results. We discussed the implications of a negative, positive and/or variant of uncertain significant result. We recommended Duane Alexander pursue genetic testing for the [facility] Multi-Cancer Panel.   The Multi-Cancer Panel offered by [facility] includes sequencing and/or deletion duplication testing of the following 85 genes: AIP, ALK, APC, ATM, AXIN2,BAP1,  BARD1, BLM, BMPR1A, BRCA1, BRCA2, BRIP1,  CASR, CDC73, CDH1, CDK4, CDKN1B, CDKN1C, CDKN2A (p14ARF), CDKN2A (p16INK4a), CEBPA, CHEK2, CTNNA1, DICER1, DIS3L2, EGFR (c.2369C>T, p.Thr790Met variant only), EPCAM (Deletion/duplication testing only), FH, FLCN, GATA2, GPC3, GREM1 (  Promoter region deletion/duplication testing only), HOXB13 (c.251G>A, p.Gly84Glu), HRAS, KIT, MAX, MEN1, MET, MITF (c.952G>A, p.Glu318Lys variant only), MLH1, MSH2, MSH3, MSH6, MUTYH, NBN, NF1, NF2, NTHL1, PALB2, PDGFRA, PHOX2B, PMS2, POLD1, POLE, POT1, PRKAR1A, PTCH1, PTEN, RAD50, RAD51C, RAD51D, RB1, RECQL4, RET, RNF43, RUNX1, SDHAF2, SDHA (sequence changes only), SDHB, SDHC, SDHD, SMAD4, SMARCA4, SMARCB1, SMARCE1, STK11, SUFU, TERC, TERT, TMEM127, TP53, TSC1, TSC2, VHL, WRN and WT1.    Based on Duane Alexander's personal and family history of cancer, he meets medical criteria for genetic testing. Despite that he meets criteria, he may still have an out of pocket cost. We discussed that if his out of pocket cost for testing is over $100, the laboratory will call and confirm whether he wants to proceed with testing.  If the out of pocket cost of testing is less than $100 he will be billed by the genetic testing laboratory.   PLAN: After considering the risks, benefits, and limitations, Duane Alexander provided informed consent to pursue genetic testing and the blood sample was sent to [facility] for analysis of the Multi-Cancer Panel. Results should be available within approximately two-three weeks' time, at which point they will be disclosed by telephone to Duane Alexander, as will any additional recommendations warranted by these results. Duane Alexander will receive a summary of his genetic counseling visit and a copy of his results once available. This information will also be available in Epic.   Duane Alexander's questions were answered to his satisfaction today. Our contact information was provided should additional questions or concerns arise. Thank you for the referral and  allowing [place] to share in the care of your patient.   Clint Guy, [place], [facility] Licensed, Certified Dispensing [email or web address] Phone: [phone]  The patient was seen for a total of 40 minutes in face-to-face genetic counseling.  This patient was discussed with Drs. Magrinat, Lindi Adie and/or Burr Medico who agrees with the above.    _______________________________________________________________________ For Office Staff:  Number of people involved in session: 1 Was an Intern/ student involved with case: no

## 2019-04-15 NOTE — Anesthesia Postprocedure Evaluation (Signed)
Anesthesia Post Note  Patient: Duane Alexander  Procedure(s) Performed: INSERTION PORT-A-CATH (Left Chest)  Patient location during evaluation: PACU Anesthesia Type: MAC Level of consciousness: awake, oriented, patient cooperative and awake and alert Pain management: pain level controlled Vital Signs Assessment: post-procedure vital signs reviewed and stable Respiratory status: spontaneous breathing, respiratory function stable and nonlabored ventilation Cardiovascular status: stable Postop Assessment: no apparent nausea or vomiting Anesthetic complications: no     Last Vitals:  Vitals:   04/15/19 0934 04/15/19 0935  BP:  (!) 165/93  Resp: 11   Temp: 36.6 C     Last Pain:  Vitals:   04/15/19 0934  TempSrc: Oral  PainSc: 0-No pain                 Syrai Gladwin

## 2019-04-15 NOTE — Interval H&P Note (Signed)
History and Physical Interval Note:  04/15/2019 8:48 AM  Duane Alexander  has presented today for surgery, with the diagnosis of Pancreatic cancer.  The various methods of treatment have been discussed with the patient and family. After consideration of risks, benefits and other options for treatment, the patient has consented to  Procedure(s): INSERTION PORT-A-CATH (Left) as a surgical intervention.  The patient's history has been reviewed, patient examined, no change in status, stable for surgery.  I have reviewed the patient's chart and labs.  Questions were answered to the patient's satisfaction.     Aviva Signs

## 2019-04-15 NOTE — Op Note (Signed)
Patient:  Duane Alexander  DOB:  [birth year]  MRN:  [id number]   Preop Diagnosis: Pancreatic carcinoma, need for central venous access  Postop Diagnosis: Same  Procedure: Port-A-Cath insertion  Surgeon: Aviva Signs, MD  Anes: MAC  Indications: Patient is a 64 year old white male who presents for Port-A-Cath insertion.  He is not undergoing chemotherapy for pancreatic carcinoma.  The risks and benefits of the procedure including bleeding, infection, and pneumothorax were fully explained to the patient, who gave informed consent.  Procedure note: The patient was placed in the Trendelenburg position after the left upper chest was prepped and draped using the usual sterile technique with ChloraPrep.  Surgical site confirmation was performed.  1% Xylocaine was used for local anesthesia.  An incision was made below the left clavicle.  A subcutaneous pocket was formed.  A needle was advanced into the left subclavian vein using the Seldinger technique without difficulty.  The guidewire was then introduced into the right atrium under fluoroscopic guidance.  An introducer and peel-away sheath were placed over the guidewire.  The catheter was inserted through the peel-away sheath and the peel-away sheath was removed.  The catheter was then attached to the port and the port placed in subcutaneous pocket.  Adequate positioning was confirmed by fluoroscopy.  Good backflow of venous blood was noted on aspiration of the port.  The port was flushed with heparin flush.  Subcutaneous layer was reapproximated using a 3-0 Vicryl interrupted suture.  The skin was closed using a 4-0 Monocryl subcuticular suture.  Dermabond was applied.  All tape needle counts were correct at the end of the procedure.  The patient was transferred to PACU in stable condition.  A chest x-ray will be performed at that time.  Complications: None  EBL: Minimal  Specimen: None

## 2019-04-15 NOTE — Discharge Instructions (Signed)
Implanted Port Home Guide An implanted port is a device that is placed under the skin. It is usually placed in the chest. The device can be used to give IV medicine, to take blood, or for dialysis. You may have an implanted port if:  You need IV medicine that would be irritating to the small veins in your hands or arms.  You need IV medicines, such as antibiotics, for a long period of time.  You need IV nutrition for a long period of time.  You need dialysis. Having a port means that your health care provider will not need to use the veins in your arms for these procedures. You may have fewer limitations when using a port than you would if you used other types of long-term IVs, and you will likely be able to return to normal activities after your incision heals. An implanted port has two main parts:  Reservoir. The reservoir is the part where a needle is inserted to give medicines or draw blood. The reservoir is round. After it is placed, it appears as a small, raised area under your skin.  Catheter. The catheter is a thin, flexible tube that connects the reservoir to a vein. Medicine that is inserted into the reservoir goes into the catheter and then into the vein. How is my port accessed? To access your port:  A numbing cream may be placed on the skin over the port site.  Your health care provider will put on a mask and sterile gloves.  The skin over your port will be cleaned carefully with a germ-killing soap and allowed to dry.  Your health care provider will gently pinch the port and insert a needle into it.  Your health care provider will check for a blood return to make sure the port is in the vein and is not clogged.  If your port needs to remain accessed to get medicine continuously (constant infusion), your health care provider will place a clear bandage (dressing) over the needle site. The dressing and needle will need to be changed every week, or as told by your health care  provider. What is flushing? Flushing helps keep the port from getting clogged. Follow instructions from your health care provider about how and when to flush the port. Ports are usually flushed with saline solution or a medicine called heparin. The need for flushing will depend on how the port is used:  If the port is only used from time to time to give medicines or draw blood, the port may need to be flushed: ? Before and after medicines have been given. ? Before and after blood has been drawn. ? As part of routine maintenance. Flushing may be recommended every 4-6 weeks.  If a constant infusion is running, the port may not need to be flushed.  Throw away any syringes in a disposal container that is meant for sharp items (sharps container). You can buy a sharps container from a pharmacy, or you can make one by using an empty hard plastic bottle with a cover. How long will my port stay implanted? The port can stay in for as long as your health care provider thinks it is needed. When it is time for the port to come out, a surgery will be done to remove it. The surgery will be similar to the procedure that was done to put the port in. Follow these instructions at home:   Flush your port as told by your health care provider.    If you need an infusion over several days, follow instructions from your health care provider about how to take care of your port site. Make sure you: ? Wash your hands with soap and water before you change your dressing. If soap and water are not available, use alcohol-based hand sanitizer. ? Change your dressing as told by your health care provider. ? Place any used dressings or infusion bags into a plastic bag. Throw that bag in the trash. ? Keep the dressing that covers the needle clean and dry. Do not get it wet. ? Do not use scissors or sharp objects near the tube. ? Keep the tube clamped, unless it is being used.  Check your port site every day for signs of  infection. Check for: ? Redness, swelling, or pain. ? Fluid or blood. ? Pus or a bad smell.  Protect the skin around the port site. ? Avoid wearing bra straps that rub or irritate the site. ? Protect the skin around your port from seat belts. Place a soft pad over your chest if needed.  Bathe or shower as told by your health care provider. The site may get wet as long as you are not actively receiving an infusion.  Return to your normal activities as told by your health care provider. Ask your health care provider what activities are safe for you.  Carry a medical alert card or wear a medical alert bracelet at all times. This will let health care providers know that you have an implanted port in case of an emergency. Get help right away if:  You have redness, swelling, or pain at the port site.  You have fluid or blood coming from your port site.  You have pus or a bad smell coming from the port site.  You have a fever. Summary  Implanted ports are usually placed in the chest for long-term IV access.  Follow instructions from your health care provider about flushing the port and changing bandages (dressings).  Take care of the area around your port by avoiding clothing that puts pressure on the area, and by watching for signs of infection.  Protect the skin around your port from seat belts. Place a soft pad over your chest if needed.  Get help right away if you have a fever or you have redness, swelling, pain, drainage, or a bad smell at the port site. This information is not intended to replace advice given to you by your health care provider. Make sure you discuss any questions you have with your health care provider. Document Revised: 04/13/2018 Document Reviewed: 01/23/2016 Elsevier Patient Education  2020 [employer]  Monitored Anesthesia Care, Care After These instructions provide you with information about caring for yourself after your procedure. Your health care  provider may also give you more specific instructions. Your treatment has been planned according to current medical practices, but problems sometimes occur. Call your health care provider if you have any problems or questions after your procedure. What can I expect after the procedure? After your procedure, you may:  Feel sleepy for several hours.  Feel clumsy and have poor balance for several hours.  Feel forgetful about what happened after the procedure.  Have poor judgment for several hours.  Feel nauseous or vomit.  Have a sore throat if you had a breathing tube during the procedure. Follow these instructions at home: For at least 24 hours after the procedure:      Have a responsible adult stay with   you. It is important to have someone help care for you until you are awake and alert.  Rest as needed.  Do not: ? Participate in activities in which you could fall or become injured. ? Drive. ? Use heavy machinery. ? Drink alcohol. ? Take sleeping pills or medicines that cause drowsiness. ? Make important decisions or sign legal documents. ? Take care of children on your own. Eating and drinking  Follow the diet that is recommended by your health care provider.  If you vomit, drink water, juice, or soup when you can drink without vomiting.  Make sure you have little or no nausea before eating solid foods. General instructions  Take over-the-counter and prescription medicines only as told by your health care provider.  If you have sleep apnea, surgery and certain medicines can increase your risk for breathing problems. Follow instructions from your health care provider about wearing your sleep device: ? Anytime you are sleeping, including during daytime naps. ? While taking prescription pain medicines, sleeping medicines, or medicines that make you drowsy.  If you smoke, do not smoke without supervision.  Keep all follow-up visits as told by your health care provider.  This is important. Contact a health care provider if:  You keep feeling nauseous or you keep vomiting.  You feel light-headed.  You develop a rash.  You have a fever. Get help right away if:  You have trouble breathing. Summary  For several hours after your procedure, you may feel sleepy and have poor judgment.  Have a responsible adult stay with you for at least 24 hours or until you are awake and alert. This information is not intended to replace advice given to you by your health care provider. Make sure you discuss any questions you have with your health care provider. Document Revised: 03/20/2017 Document Reviewed: 04/12/2015 Elsevier Patient Education  2020 [employer] Inc.  

## 2019-04-15 NOTE — Anesthesia Preprocedure Evaluation (Addendum)
Anesthesia Evaluation  Patient identified by MRN, date of birth, ID band Patient awake  General Assessment Comment:US guided PIVs every time  Reviewed: Allergy & Precautions, NPO status , Patient's Chart, lab work & pertinent test results  History of Anesthesia Complications (+) DIFFICULT IV STICK / SPECIAL LINE and history of anesthetic complications  Airway Mallampati: I  TM Distance: >3 FB Neck ROM: Full   Comment: ACDF Dental no notable dental hx. (+) Dental Advisory Given, Upper Dentures   Pulmonary COPD,  COPD inhaler, Current SmokerPatient did not abstain from smoking.,  23 pack year history, about 1/2ppd currently   Pulmonary exam normal breath sounds clear to auscultation       Cardiovascular hypertension, Pt. on medications + CAD  Normal cardiovascular exam Rhythm:Regular Rate:Normal  HLD     Neuro/Psych  Neuromuscular disease (Right leg weakness) negative psych ROS   GI/Hepatic GERD  Controlled and Medicated,(+) Cirrhosis       , Hepatitis -, CHCV untreated, Pancreatic carcinoma Pancreatic mass   Endo/Other  negative endocrine ROS  Renal/GU negative Renal ROS  negative genitourinary   Musculoskeletal  (+) Arthritis , Osteoarthritis,  ACDF   Abdominal Normal abdominal exam  (+)   Peds negative pediatric ROS (+)  Hematology negative hematology ROS (+)   Anesthesia Other Findings   Reproductive/Obstetrics negative OB ROS                           Anesthesia Physical  Anesthesia Plan  ASA: III  Anesthesia Plan: MAC   Post-op Pain Management:    Induction: Intravenous  PONV Risk Score and Plan: 1 and Ondansetron and Propofol infusion  Airway Management Planned: Natural Airway and Simple Face Mask  Additional Equipment: None  Intra-op Plan:   Post-operative Plan:   Informed Consent: I have reviewed the patients History and Physical, chart, labs and discussed the  procedure including the risks, benefits and alternatives for the proposed anesthesia with the patient or authorized representative who has indicated his/her understanding and acceptance.       Plan Discussed with: CRNA and Surgeon  Anesthesia Plan Comments:       Anesthesia Quick Evaluation

## 2019-04-15 NOTE — Transfer of Care (Signed)
Immediate Anesthesia Transfer of Care Note  Patient: Duane Alexander  Procedure(s) Performed: INSERTION PORT-A-CATH (Left Chest)  Patient Location: PACU  Anesthesia Type:General  Level of Consciousness: awake, alert , oriented and patient cooperative  Airway & Oxygen Therapy: Patient Spontanous Breathing and Patient connected to face mask oxygen  Post-op Assessment: Report given to RN, Post -op Vital signs reviewed and stable and Patient moving all extremities X 4  Post vital signs: Reviewed and stable  Last Vitals:  Vitals Value Taken Time  BP    Temp    Pulse    Resp    SpO2      Last Pain:  Vitals:   04/15/19 0934  TempSrc: Oral  PainSc: 0-No pain      Patients Stated Pain Goal: 6 ([id number])  Complications: No apparent anesthesia complications

## 2019-04-16 ENCOUNTER — Inpatient Hospital Stay (HOSPITAL_COMMUNITY): Payer: BC Managed Care – PPO | Admitting: General Practice

## 2019-04-16 ENCOUNTER — Other Ambulatory Visit: Payer: Self-pay

## 2019-04-16 ENCOUNTER — Inpatient Hospital Stay (HOSPITAL_BASED_OUTPATIENT_CLINIC_OR_DEPARTMENT_OTHER): Payer: BC Managed Care – PPO | Admitting: Hematology

## 2019-04-16 VITALS — BP 167/94 | HR 58 | Temp 96.9°F | Resp 18 | Wt 161.4 lb

## 2019-04-16 DIAGNOSIS — Z801 Family history of malignant neoplasm of trachea, bronchus and lung: Secondary | ICD-10-CM | POA: Diagnosis not present

## 2019-04-16 DIAGNOSIS — C250 Malignant neoplasm of head of pancreas: Secondary | ICD-10-CM | POA: Diagnosis not present

## 2019-04-16 DIAGNOSIS — F17210 Nicotine dependence, cigarettes, uncomplicated: Secondary | ICD-10-CM | POA: Diagnosis not present

## 2019-04-16 DIAGNOSIS — K769 Liver disease, unspecified: Secondary | ICD-10-CM | POA: Diagnosis not present

## 2019-04-16 DIAGNOSIS — Z8041 Family history of malignant neoplasm of ovary: Secondary | ICD-10-CM | POA: Diagnosis not present

## 2019-04-16 DIAGNOSIS — Z808 Family history of malignant neoplasm of other organs or systems: Secondary | ICD-10-CM | POA: Diagnosis not present

## 2019-04-16 DIAGNOSIS — Z800 Family history of malignant neoplasm of digestive organs: Secondary | ICD-10-CM | POA: Diagnosis not present

## 2019-04-16 DIAGNOSIS — Z5111 Encounter for antineoplastic chemotherapy: Secondary | ICD-10-CM | POA: Diagnosis not present

## 2019-04-16 DIAGNOSIS — R202 Paresthesia of skin: Secondary | ICD-10-CM | POA: Diagnosis not present

## 2019-04-16 DIAGNOSIS — Z5189 Encounter for other specified aftercare: Secondary | ICD-10-CM | POA: Diagnosis not present

## 2019-04-16 DIAGNOSIS — Z806 Family history of leukemia: Secondary | ICD-10-CM | POA: Diagnosis not present

## 2019-04-16 DIAGNOSIS — Z8042 Family history of malignant neoplasm of prostate: Secondary | ICD-10-CM | POA: Diagnosis not present

## 2019-04-16 DIAGNOSIS — R1013 Epigastric pain: Secondary | ICD-10-CM | POA: Diagnosis not present

## 2019-04-16 DIAGNOSIS — Z79899 Other long term (current) drug therapy: Secondary | ICD-10-CM | POA: Diagnosis not present

## 2019-04-16 DIAGNOSIS — K5900 Constipation, unspecified: Secondary | ICD-10-CM | POA: Diagnosis not present

## 2019-04-16 DIAGNOSIS — Z8051 Family history of malignant neoplasm of kidney: Secondary | ICD-10-CM | POA: Diagnosis not present

## 2019-04-16 NOTE — Progress Notes (Signed)
[facility] Initial Psychosocial Assessment Clinical Social Work  Clinical Social Work contacted by phone to assess psychosocial, emotional, mental health, and spiritual needs of the patient.   Barriers to care/review of distress screen:  - Transportation:  Do you anticipate any problems getting to appointments?  Do you have someone who can help run errands for you if you need it?  No issues - sister will drive. - Help at home:  What is your living situation (alone, family, other)?  If you are physically unable to care for yourself, who would you call on to help you?  Has been staying w sister, but usually lives w roommate.  Now that he is starting chemo, he will be staying with sister.  Sister has cared for brother and son, both passed away from cancer.   - Support system:  What does your support system look like?  Who would you call on if you needed some kind of practical help?  What if you needed someone to talk to for emotional support?  Has good support from sister - "Ill do whatever I need to do."  He has "friends who live nearby but I am his primary support."   - Finances:  Are you concerned about finances.  Considering returning to work?  If not, applying for disability?  Has been taken out of work as of today, working on [employer].  Has short term disability income through work.  Can retire in October 2021 w full benefits.  Have spoken w HR person at work, they seem supportive.    What is your understanding of where you are with your cancer? Its cause?  Your treatment plan and what happens next?  Has Stage 3 pancreatic cancer, his chemo will start soon - 4 month course of treatment.  "He started hurting really bad above his stomach, was in a lot of pain for 2 weeks, went to PCP, got CT scan."  Was referred to oncologist due to seeing "mass" of some kind.  Had additional tests which led to more clarification of diagnosis.  Previously in reasonable health except for cirrhosis of liver. "Was shocked  that he got cancer."  "Other than that, he takes vitamins and blood pressure medications."  Is reasonably health and able to take care of himself.  "He is not really stressed, he has accepted it and 'here we go, lets do what we have to do.'"  Is 'not happy' that he cannot go back to work at this time.    What are your worries for the future as you begin treatment for cancer?  "none that he can think of", talked w oncologist today - worried that when they open him up to take the rest of the mass "he would get it back."  Worried about recurrance in the future.  Has been told "he still has a fighting chance, good chance to live."  Trying to stay positive.    What are your hopes and priorities during your treatment? What is important to you? What are your goals for your care?  Wants to return to work after treatment.  Being w friends is important, especially   CSW Summary:  Patient and family psychosocial functioning including strengths, limitations, and coping skills:  Patient is a 64 year old single male, newly diagnosed with pancreatic cancer.  Diagnosis was unexpected, made after exploring source of severe pain.  Lived w roommate, now moving in w sister to have greater support in caregiving.  Had another brother  and nephew who died from cancer - "theres a lot of cancer in our family."  Sister has experienced the challenges of caregiving for patient w cancer, wants to provide same kind of support for patient.  CSW and patient discussed common feeling and emotions when being diagnosed with cancer, and the importance of support during treatment. CSW informed patient of the support team and support services at [facility]. CSW provided contact information and encouraged patient to call with any questions or concerns.  Identifications of barriers to care:  None noted  Availability of community resources:  [place] Worker follow up needed: No.   Edwyna Shell,  [place] Social Worker Phone:  [phone] Cell:  [phone]

## 2019-04-16 NOTE — Progress Notes (Signed)
I have been getting copied on his oncology notes . I notice his blood pressures have been running high.  We need to adjust his bp medications.  Please set up virtual or in person recheck to discuss and adjust medications.

## 2019-04-16 NOTE — Progress Notes (Signed)
[address], [place] [address]   CLINIC:  Medical Oncology/Hematology  PCP:  [name] Maud [place] [address] [phone]   REASON FOR VISIT:  Follow-up for pancreatic adenocarcinoma.  CURRENT THERAPY: Neoadjuvant chemotherapy with FOLFIRINOX.  BRIEF ONCOLOGIC HISTORY:  Oncology History  Pancreatic cancer ([place])  03/28/2019 Initial Diagnosis   Pancreatic cancer ([place])   03/28/2019 Cancer Staging   Staging form: Exocrine Pancreas, AJCC 8th Edition - Clinical stage from 03/28/2019: Stage III (cT4, cN1, cM0) - Signed by Derek Jack, MD on 03/28/2019   04/17/2019 -  Chemotherapy   The patient had PALONOSETRON HCL INJECTION 0.25 MG/5ML, 0.25 mg, Intravenous,  Once, 0 of 4 cycles irinotecan (CAMPTOSAR) 280 mg in sodium chloride 0.9 % 500 mL chemo infusion, 150 mg/m2, Intravenous,  Once, 0 of 4 cycles oxaliplatin (ELOXATIN) 165 mg in dextrose 5 % 500 mL chemo infusion, 85 mg/m2, Intravenous,  Once, 0 of 4 cycles FOSAPREPITANT IV INFUSION 150 MG, 150 mg, Intravenous,  Once, 0 of 4 cycles fluorouracil (ADRUCIL) 4,650 mg in sodium chloride 0.9 % 57 mL chemo infusion, 2,400 mg/m2, Intravenous, 1 Day/Dose, 0 of 4 cycles leucovorin 772 mg in sodium chloride 0.9 % 250 mL infusion, 400 mg/m2, Intravenous,  Once, 0 of 4 cycles  for chemotherapy treatment.       CANCER STAGING: Cancer Staging Pancreatic cancer [facility]) Staging form: Exocrine Pancreas, AJCC 8th Edition - Clinical stage from 03/28/2019: Stage III (cT4, cN1, cM0) - Signed by Derek Jack, MD on 03/28/2019    INTERVAL HISTORY:  Duane Alexander 64 y.o. male seen for follow-up of pancreatic adenocarcinoma.  He had port placed uneventfully.  Appetite and energy levels are 100%.  He is continuing to work.  Reports abdominal pain rated as 6 out of 10.  He is taking hydrocodone 1 to 2 tablets every 4 hours as needed.  He reports some constipation which is  managed with stool softener.  Denies any nausea vomiting or diarrhea.  He has numbness in the upper and lower extremities from back surgeries.    REVIEW OF SYSTEMS:  Review of Systems  Gastrointestinal: Positive for abdominal pain and constipation.  Neurological: Positive for numbness.  Psychiatric/Behavioral: Positive for sleep disturbance.  All other systems reviewed and are negative.    PAST MEDICAL/SURGICAL HISTORY:  Past Medical History:  Diagnosis Date  . Aortic atherosclerosis ([place])   . COPD (chronic obstructive pulmonary disease) ([place])    per CT findings 2019  . Family history of brain cancer   . Family history of colon cancer   . Family history of kidney cancer   . Family history of leukemia   . Family history of lung cancer   . Family history of ovarian cancer   . Family history of prostate cancer   . Family history of stomach cancer   . GERD (gastroesophageal reflux disease)   . Hepatitis C    referred to hepatitis clinic 2019 but he never went  . Hyperlipidemia   . Hypertension   . Smoker   . Wears glasses    Past Surgical History:  Procedure Laterality Date  . ANTERIOR CERVICAL DECOMP/DISCECTOMY FUSION  12/2017   Dr. Consuella Lose  . BIOPSY  03/11/2019   Procedure: BIOPSY;  Surgeon: [name]., MD;  Location: [facility];  Service: Gastroenterology;;  . COLONOSCOPY N/A 01/19/2017   Procedure: COLONOSCOPY;  Surgeon: Rogene Houston, MD;  [facility];  Service: Endoscopy;  Laterality: N/A;  930-moved to 9:45 per Lelon Frohlich  . ESOPHAGOGASTRODUODENOSCOPY (EGD) WITH PROPOFOL N/A 03/11/2019   Procedure: ESOPHAGOGASTRODUODENOSCOPY (EGD) WITH PROPOFOL;  Surgeon: Rush Landmark Telford Nab., MD;  Location[facility] ENDOSCOPY;  Service: Gastroenterology;  Laterality: N/A;  . EUS N/A 03/11/2019   Procedure: UPPER ENDOSCOPIC ULTRASOUND (EUS) RADIAL;  Surgeon: Rush Landmark Telford Nab., MD;  Location: [facility];  Service: Gastroenterology;  Laterality: N/A;  .  FINE NEEDLE ASPIRATION  03/11/2019   Procedure: FINE NEEDLE ASPIRATION (FNA) LINEAR;  Surgeon: [name]., MD;  Location: [facility];  Service: Gastroenterology;;  . POLYPECTOMY  01/19/2017   Procedure: POLYPECTOMY;  Surgeon: Rogene Houston, MD;  [facility];  Service: Endoscopy;;  splenic flexure  . POLYPECTOMY  03/11/2019   Procedure: POLYPECTOMY;  Surgeon: Mansouraty, Telford Nab., MD;  Location: [facility];  Service: Gastroenterology;;  . Sol Passer PLACEMENT Left 04/15/2019   Procedure: INSERTION PORT-A-CATH;  Surgeon: Aviva Signs, MD;  Location: [facility];  Service: General;  Laterality: Left;  . Right eye surgery     as a child-removed muscle from leg and put in upper eye lid     SOCIAL HISTORY:  Social History   Socioeconomic History  . Marital status: Widowed    Spouse name: Not on file  . Number of children: Not on file  . Years of education: Not on file  . Highest education level: Not on file  Occupational History    Employer: [employer]  Tobacco Use  . Smoking status: Current Every Day Smoker    Packs/day: 0.50    Years: 45.00    Pack years: 22.50    Types: Cigarettes  . Smokeless tobacco: Never Used  Substance and Sexual Activity  . Alcohol use: Not Currently  . Drug use: No  . Sexual activity: Not Currently  Other Topics Concern  . Not on file  Social History Narrative  . Not on file   Social Determinants of Health   Financial Resource Strain: Low Risk   . Difficulty of Paying Living Expenses: Not hard at all  Food Insecurity: No Food Insecurity  . Worried About Charity fundraiser in the Last Year: Never true  . Ran Out of Food in the Last Year: Never true  Transportation Needs: No Transportation Needs  . Lack of Transportation (Medical): No  . Lack of Transportation (Non-Medical): No  Physical Activity: Inactive  . Days of Exercise per Week: 0 days  . Minutes of Exercise per Session: 0 min  Stress: Stress Concern  Present  . Feeling of Stress : Rather much  Social Connections: Moderately Isolated  . Frequency of Communication with Friends and Family: Three times a week  . Frequency of Social Gatherings with Friends and Family: Three times a week  . Attends Religious Services: Never  . Active Member of Clubs or Organizations: No  . Attends Archivist Meetings: Never  . Marital Status: Widowed  Intimate Partner Violence: Not At Risk  . Fear of Current or Ex-Partner: No  . Emotionally Abused: No  . Physically Abused: No  . Sexually Abused: No    FAMILY HISTORY:  Family History  Problem Relation Age of Onset  . Throat cancer Father 23       died in his 24s of stomach issues?  . Lung cancer Brother 76       smoker, non-small cell  . Ovarian cancer Sister 65       GI related death  . Heart attack  Maternal Grandmother   . Brain cancer Maternal Aunt        dx. in her 37s  . Lung cancer Maternal Uncle        dx. in his 38s-60s, smoker  . Stomach cancer Maternal Aunt        dx. in her 99s  . Colon cancer Cousin        dx. >50 - maternal cousin  . Kidney cancer Cousin        dx. in his 22s - maternal cousin  . Prostate cancer Cousin        dx. in his 45s - maternal cousin  . Leukemia Nephew 34  . Heart disease Neg Hx   . Hypertension Neg Hx   . Stroke Neg Hx     CURRENT MEDICATIONS:  Outpatient Encounter Medications as of 04/16/2019  Medication Sig  . docusate sodium (COLACE) 100 MG capsule Take 100 mg by mouth daily.  [name] HYDROcodone-acetaminophen (NORCO/VICODIN) 5-325 MG tablet Take 1 tablet by mouth every 4 (four) hours as needed for moderate pain.  [name] lisinopril (ZESTRIL) 20 MG tablet TAKE 1 TABLET BY MOUTH DAILY (Patient taking differently: Take 20 mg by mouth daily. )  . lovastatin (MEVACOR) 20 MG tablet TAKE 1 TABLET BY MOUTH AT BEDTIME (Patient taking differently: Take 20 mg by mouth at bedtime. )  . melatonin 5 MG TABS Take 5 mg by mouth at bedtime.  . Multiple Vitamin  (MULTIVITAMIN) tablet Take 1 tablet by mouth daily.   No facility-administered encounter medications on file as of 04/16/2019.    ALLERGIES:  No Known Allergies   PHYSICAL EXAM:  ECOG Performance status: 1  Vitals:   04/16/19 1035  BP: (!) 167/94  Pulse: (!) 58  Resp: 18  Temp: (!) 96.9 F (36.1 C)  SpO2: 100%   Filed Weights   04/16/19 1035  Weight: 161 lb 6.4 oz (73.2 kg)    Physical Exam Vitals reviewed.  Constitutional:      Appearance: Normal appearance.  Cardiovascular:     Rate and Rhythm: Normal rate and regular rhythm.     Heart sounds: Normal heart sounds.  Pulmonary:     Effort: Pulmonary effort is normal.     Breath sounds: Normal breath sounds.  Abdominal:     Palpations: Abdomen is soft. There is no mass.  Skin:    General: Skin is warm.  Neurological:     General: No focal deficit present.     Mental Status: He is alert and oriented to person, place, and time.  Psychiatric:        Mood and Affect: Mood normal.        Behavior: Behavior normal.      LABORATORY DATA:  I have reviewed the labs as listed.  CBC    Component Value Date/Time   WBC 7.6 03/23/2019 1546   RBC 4.10 (L) 03/23/2019 1546   HGB 13.6 03/23/2019 1546   HGB 14.7 08/10/2018 0927   HCT 41.4 03/23/2019 1546   HCT 43.9 08/10/2018 0927   PLT  03/23/2019 1546    PLATELET CLUMPS NOTED ON SMEAR, COUNT APPEARS ADEQUATE   PLT 252 08/10/2018 0927   MCV 101.0 (H) 03/23/2019 1546   MCV 101 (H) 08/10/2018 0927   MCH 33.2 03/23/2019 1546   MCHC 32.9 03/23/2019 1546   RDW 12.2 03/23/2019 1546   RDW 12.0 08/10/2018 0927   LYMPHSABS 1.8 03/23/2019 1546   LYMPHSABS 4.5 (H) 08/10/2018 0927   MONOABS  0.5 03/23/2019 1546   EOSABS 0.1 03/23/2019 1546   EOSABS 0.1 08/10/2018 0927   BASOSABS 0.0 03/23/2019 1546   BASOSABS 0.1 08/10/2018 0927   CMP Latest Ref Rng & Units 03/23/2019 03/12/2019 02/13/2019  Glucose 70 - 99 mg/dL 116(H) - -  BUN 8 - 23 mg/dL 26(H) - -  Creatinine 0.61 -  1.24 mg/dL 1.31(H) 0.80 -  Sodium 135 - 145 mmol/L 138 - -  Potassium 3.5 - 5.1 mmol/L 4.4 - -  Chloride 98 - 111 mmol/L 102 - -  CO2 22 - 32 mmol/L 25 - -  Calcium 8.9 - 10.3 mg/dL 9.2 - -  Total Protein 6.5 - 8.1 g/dL 7.9 - 8.6(H)  Total Bilirubin 0.3 - 1.2 mg/dL 1.0 - 1.4(H)  Alkaline Phos 38 - 126 U/L 55 - 52  AST 15 - 41 U/L 37 - 44(H)  ALT 0 - 44 U/L 38 - 42       DIAGNOSTIC IMAGING:  I have independently reviewed scans and discussed with the patient.     ASSESSMENT & PLAN:   Pancreatic cancer ([place]) 1.  Pancreatic adenocarcinoma ([id number]): -EGD/EUS on 03/11/2019 showed mass in the pancreatic head.  Endoscopic staging is T4 N1 MX.  Few malignant appearing lymph nodes were visualized in the porta hepatis region.  Largest measured 26 mm x 23 mm and was biopsied. -Pathology indicated adenocarcinoma of the biopsy of the pancreatic head mass as well as lymph node. -PET scan on 03/28/2019 did not show any metastatic disease. -CA 19-9 was 40 on 02/13/2019. -CT abdomen with contrast on 03/12/2019 showed pancreatic duct dilatation in the body and tail.  Hypodense lesion measuring 1.4 x 1.3 x 1.3 cm, difficult to define on CT exam.  Lesion appears to contact the ventral surface of the proximal portal vein at the junction of the SMV over 15 mm segment.  SMA appears clear of the lesion. -He has an appointment to see Dr. Barry Dienes on 04/22/2019. -I have recommended neoadjuvant chemotherapy with at least 4 months of FOLFIRINOX.  I have also recommended germline mutation testing, MSI testing and next-generation sequencing. -He has some numbness in the hands and feet from disc problems.  He has numbness since back surgery.  He has mild cirrhosis with normal bilirubin and LFTs. -I will consider dose reduction during the first 1-2 cycles.  We discussed the side effects of this regimen in detail.  He already has a port placed.  2.  Liver disease: -Recent CT scan showed mild degree of cirrhosis.  He has  a history of hepatitis C which was untreated.  LFTs including albumin and bilirubin are normal.  3.  Epigastric pain: -Throbbing pain in the epigastric region for which he takes hydrocodone 5/325 every 4 hours as needed. -She will continue stool softener as needed.      Orders placed this encounter:  Orders Placed This Encounter  Procedures  . CBC with Differential/Platelet  . Comprehensive metabolic panel   Total time spent is 40 minutes with more than 50% of the time spent face-to-face discussing scan results, treatment plan, side effects, counseling and coordination of care.   Derek Jack, MD [place] [phone]

## 2019-04-16 NOTE — Patient Instructions (Signed)
[place] at [facility] Discharge Instructions  You were seen today by Dr. Delton Coombes. He went over your recent test results. He will start you on FOLFIRINOX. You will have treatment every 2 weeks. He will see you back in 1 week for labs, treatment and follow up.   Thank you for choosing [place] at [facility] to provide your oncology and hematology care.  To afford each patient quality time with our provider, please arrive at least 15 minutes before your scheduled appointment time.   If you have a lab appointment with the [facility] please come in thru the  Main Entrance and check in at the main information desk  You need to re-schedule your appointment should you arrive 10 or more minutes late.  We strive to give you quality time with our providers, and arriving late affects you and other patients whose appointments are after yours.  Also, if you no show three or more times for appointments you may be dismissed from the clinic at the providers discretion.     Again, thank you for choosing [facility].  Our hope is that these requests will decrease the amount of time that you wait before being seen by our physicians.       _____________________________________________________________  Should you have questions after your visit to [facility], please contact our office at [phone] between the hours of 8:00 a.m. and 4:30 p.m.  Voicemails left after 4:00 p.m. will not be returned until the following business day.  For prescription refill requests, have your pharmacy contact our office and allow 72 hours.    Cancer Center Support Programs:   > Cancer Support Group  2nd Tuesday of the month 1pm-2pm, Journey Room

## 2019-04-16 NOTE — Assessment & Plan Note (Signed)
1.  Pancreatic adenocarcinoma ([id number]): -EGD/EUS on 03/11/2019 showed mass in the pancreatic head.  Endoscopic staging is T4 N1 MX.  Few malignant appearing lymph nodes were visualized in the porta hepatis region.  Largest measured 26 mm x 23 mm and was biopsied. -Pathology indicated adenocarcinoma of the biopsy of the pancreatic head mass as well as lymph node. -PET scan on 03/28/2019 did not show any metastatic disease. -CA 19-9 was 40 on 02/13/2019. -CT abdomen with contrast on 03/12/2019 showed pancreatic duct dilatation in the body and tail.  Hypodense lesion measuring 1.4 x 1.3 x 1.3 cm, difficult to define on CT exam.  Lesion appears to contact the ventral surface of the proximal portal vein at the junction of the SMV over 15 mm segment.  SMA appears clear of the lesion. -He has an appointment to see Dr. Barry Dienes on 04/22/2019. -I have recommended neoadjuvant chemotherapy with at least 4 months of FOLFIRINOX.  I have also recommended germline mutation testing, MSI testing and next-generation sequencing. -He has some numbness in the hands and feet from disc problems.  He has numbness since back surgery.  He has mild cirrhosis with normal bilirubin and LFTs. -I will consider dose reduction during the first 1-2 cycles.  We discussed the side effects of this regimen in detail.  He already has a port placed.  2.  Liver disease: -Recent CT scan showed mild degree of cirrhosis.  He has a history of hepatitis C which was untreated.  LFTs including albumin and bilirubin are normal.  3.  Epigastric pain: -Throbbing pain in the epigastric region for which he takes hydrocodone 5/325 every 4 hours as needed. -She will continue stool softener as needed.

## 2019-04-16 NOTE — Progress Notes (Signed)
START ON PATHWAY REGIMEN - Pancreatic Adenocarcinoma     A cycle is every 14 days:     Oxaliplatin      Leucovorin      Irinotecan      Fluorouracil   **Always confirm dose/schedule in your pharmacy ordering system**  Patient Characteristics: Preoperative (Clinical Staging), Borderline Resectable, PS = 0,1 Therapeutic Status: Preoperative (Clinical Staging) AJCC T Category: cT4 AJCC N Category: cN1 Resectability Status: Borderline Resectable AJCC M Category: cM0 AJCC 8 Stage Grouping: III ECOG Performance Status: 0 Intent of Therapy: Curative Intent, Discussed with Patient

## 2019-04-17 ENCOUNTER — Encounter (HOSPITAL_COMMUNITY): Payer: Self-pay

## 2019-04-17 DIAGNOSIS — Z95828 Presence of other vascular implants and grafts: Secondary | ICD-10-CM

## 2019-04-17 HISTORY — DX: Presence of other vascular implants and grafts: Z95.828

## 2019-04-17 MED ORDER — PROCHLORPERAZINE MALEATE 10 MG PO TABS: 10.0000 mg | ORAL_TABLET | Freq: Four times a day (QID) | ORAL | 3 refills | Status: AC | PRN

## 2019-04-17 MED ORDER — LIDOCAINE-PRILOCAINE 2.5-2.5 % EX CREA: TOPICAL_CREAM | CUTANEOUS | 3 refills | Status: AC

## 2019-04-17 MED ORDER — LOPERAMIDE HCL 2 MG PO TABS: ORAL_TABLET | ORAL | 1 refills | Status: AC

## 2019-04-17 NOTE — Progress Notes (Signed)
.   Pharmacist Chemotherapy Monitoring - Initial Assessment    Anticipated start date: 04/23/19   Regimen:  . Are orders appropriate based on the patient's diagnosis, regimen, and cycle? Yes . Does the plan date match the patient's scheduled date? Yes . Is the sequencing of drugs appropriate? Yes . Are the premedications appropriate for the patient's regimen? Yes . Prior Authorization for treatment is: Approved o If applicable, is the correct biosimilar selected based on the patient's insurance? not applicable  Organ Function and Labs: [name] Are dose adjustments needed based on the patient's renal function, hepatic function, or hematologic function? No . Are appropriate labs ordered prior to the start of patient's treatment? Yes . Other organ system assessment, if indicated: N/A . The following baseline labs, if indicated, have been ordered: N/A  Dose Assessment: . Are the drug doses appropriate? Yes . Are the following correct: o Drug concentrations Yes o IV fluid compatible with drug Yes o Administration routes Yes o Timing of therapy Yes . If applicable, does the patient have documented access for treatment and/or plans for port-a-cath placement? yes . If applicable, have lifetime cumulative doses been properly documented and assessed? not applicable Lifetime Dose Tracking  No doses have been documented on this patient for the following tracked chemicals: Doxorubicin, Epirubicin, Idarubicin, Daunorubicin, Mitoxantrone, Bleomycin, Oxaliplatin, Carboplatin, Liposomal Doxorubicin  o   Toxicity Monitoring/Prevention: . The patient has the following take home antiemetics prescribed: NEED ORDERED . The patient has the following take home medications prescribed: N/A . Medication allergies and previous infusion related reactions, if applicable, have been reviewed and addressed. Yes . The patient's current medication list has been assessed for drug-drug interactions with their chemotherapy  regimen. no significant drug-drug interactions were identified on review.  Order Review: . Are the treatment plan orders signed? No . Is the patient scheduled to see a provider prior to their treatment? Yes  I verify that I have reviewed each item in the above checklist and answered each question accordingly.  Duane Alexander 04/17/2019 4:06 PM

## 2019-04-17 NOTE — Patient Instructions (Addendum)
[facility] Chemotherapy Teaching   You are diagnosed with pancreatic adenocarcinoma (cancer).  You will be treated every 2 weeks with a combination of chemotherapy drugs - oxaliplatin, irinotecan, leucovorin (not a chemo drug) and fluorouracil (5FU).  The intent of treatment is to cure your disease.  You will see the doctor regularly throughout treatment.  We will obtain blood work from you prior to every treatment and monitor your results to make sure it is safe to give your treatment. The doctor monitors your response to treatment by the way you are feeling, your blood work, and by obtaining scans periodically.  There will be wait times while you are here for treatment.  It will take about 30 minutes to 1 hour for your lab work to result.  Then there will be wait times while pharmacy mixes your medications.    Medications you will receive in the clinic prior to your chemotherapy medications:  Aloxi:  ALOXI is used in adults to help prevent the nausea and vomiting that happens with certain chemotherapy drugs.  Aloxi is a long acting medication, and will remain in your system for about 2 days.   Emend:  This is an anti-nausea medication that is used with Aloxi to help prevent nausea and vomiting caused by chemotherapy.  Dexamethasone:  This is a steroid given prior to chemotherapy to help prevent allergic reactions; it may also help prevent and control nausea and diarrhea.    Oxaliplatin (Eloxatin)  About This Drug  Oxaliplatin is used to treat cancer. It is given in the vein (IV).  It takes two hours to infuse.  Possible Side Effects  . Bone marrow suppression. This is a decrease in the number of white blood cells, red blood cells, and platelets. This may raise your risk of infection, make you tired and weak (fatigue), and raise your risk of bleeding.  . Tiredness  . Soreness of the mouth and throat. You may have red areas, white patches, or sores that hurt.  . Nausea  and vomiting (throwing up)  . Diarrhea (loose bowel movements)  . Changes in your liver function  . Effects on the nerves called peripheral neuropathy. You may feel numbness, tingling, or pain in your hands and feet, and may be worse in cold temperatures. It may be hard for you to button your clothes, open jars, or walk as usual. The effect on the nerves may get worse with more doses of the drug. These effects get better in some people after the drug is stopped but it does not get better in all people  Note: Each of the side effects above was reported in 40% or greater of patients treated with oxaliplatin. Not all possible side effects are included above.  Warnings and Precautions  . Allergic reactions, including anaphylaxis, which may be life-threatening are rare but may happen in some patients. Signs of allergic reaction to this drug may be swelling of the face, feeling like your tongue or throat are swelling, trouble breathing, rash, itching, fever, chills, feeling dizzy, and/or feeling that your heart is beating in a fast or not normal way. If this happens, do not take another dose of this drug. You should get urgent medical treatment.  . Inflammation (swelling) of the lungs, which may be life-threatening. You may have a dry cough or trouble breathing.  . Effects on the nerves (neuropathy) may resolve within 14 days, or it may persist beyond 14 days.  . Severe decrease in white blood cells  when combined with the chemotherapy agents 5-fluorouracil and leucovorin. This may be life-threatening.  . Severe changes in your liver function  . Abnormal heart beat and/or EKG, which can be life-threatening  . Rhabdomyolysis- damage to your muscles which may release proteins in your blood and affect how your kidneys work, which can be life-threatening. You may have severe muscle weakness and/or pain, or dark urine.  Important Information  . This drug may impair your ability to drive or use  machinery. Talk to your doctor and/or nurse about precautions you may need to take.  . This drug may be present in the saliva, tears, sweat, urine, stool, vomit, semen, and vaginal secretions. Talk to your doctor and/or your nurse about the necessary precautions to take during this time.  . The effects on the nerves can be aggravated by exposure to cold. Avoid cold beverages, use of ice and make sure you cover your skin and dress warmly prior to being exposed to cold temperatures while you are receiving treatment with oxaliplatin.  Treating Side Effects  . Manage tiredness by pacing your activities for the day.  . Be sure to include periods of rest between energy-draining activities.  . To decrease the risk of infection, wash your hands regularly.  . Avoid close contact with people who have a cold, the flu, or other infections.  . Take your temperature as your doctor or nurse tells you, and whenever you feel like you may have a fever.  . To help decrease the risk of bleeding, use a soft toothbrush. Check with your nurse before using dental floss.  . Be very careful when using knives or tools.  . Use an electric shaver instead of a razor.  . Drink plenty of fluids (a minimum of eight glasses per day is recommended).  . Mouth care is very important. Your mouth care should consist of routine, gentle cleaning of your teeth or dentures and rinsing your mouth with a mixture of 1/2 teaspoon of salt in 8 ounces of water or 1/2 teaspoon of baking soda in 8 ounces of water. This should be done at least after each meal and at bedtime.  . If you have mouth sores, avoid mouthwash that has alcohol. Also avoid alcohol and smoking because they can bother your mouth and throat.  . To help with nausea and vomiting, eat small, frequent meals instead of three large meals a day. Choose foods and drinks that are at room temperature. Ask your nurse or doctor about other helpful tips and medicine that is  available to help stop or lessen these symptoms.  . If you throw up or have loose bowel movements, you should drink more fluids so that you do not become dehydrated (lack of water in the body from losing too much fluid).  . If you have diarrhea, eat low-fiber foods that are high in protein and calories and avoid foods that can irritate your digestive tracts or lead to cramping.  . Ask your nurse or doctor about medicine that can lessen or stop your diarrhea.  . If you have numbness and tingling in your hands and feet, be careful when cooking, walking, and handling sharp objects and hot liquids.  . Do not drink cold drinks or use ice in beverages. Drink fluids at room temperature or warmer, and drink through a straw.  . Wear gloves to touch cold objects, and wear warm clothing and cover you skin during cold weather.  Food and Drug Interactions  . There  are no known interactions of oxaliplatin with food and other medications.  . This drug may interact with other medicines. Tell your doctor and pharmacist about all the prescription and over-the-counter medicines and dietary supplements (vitamins, minerals, herbs and others) that you are taking at this time. Also, check with your doctor or pharmacist before starting any new prescription or over-the-counter medicines, or dietary supplements to make sure that there are no interactions  When to Call the Doctor  Call your doctor or nurse if you have any of these symptoms and/or any new or unusual symptoms:  . Fever of 100.4 F (38 C) or higher  . Chills  . Tiredness that interferes with your daily activities  . Feeling dizzy or lightheaded  . Easy bleeding or bruising  . Feeling that your heart is beating in a fast or not normal way (palpitations)  . Pain in your chest  . Dry cough  . Trouble breathing  . Pain in your mouth or throat that makes it hard to eat or drink  . Nausea that stops you from eating or drinking and/or is not  relieved by prescribed medicines  . Throwing up more than 3 times a day  . Diarrhea, 4 times in one day or diarrhea with lack of strength or a feeling of being dizzy  . Numbness, tingling, or pain in your hands and feet  . Signs of possible liver problems: dark urine, pale bowel movements, bad stomach pain, feeling very tired and weak, unusual itching, or yellowing of the eyes or skin  . Signs of rhabdomyolysis: decreased urine, very dark urine, muscle pain in the shoulders, thighs, or lower back; muscle weakness or trouble moving arms and legs  . Signs of allergic reaction: swelling of the face, feeling like your tongue or throat are swelling, trouble breathing, rash, itching, fever, chills, feeling dizzy, and/or feeling that your heart is beating in a fast or not normal way. If this happens, call 911 for emergency care.  . If you think you may be pregnant  Reproduction Warnings  . Pregnancy warning: This drug may have harmful effects on the unborn baby. Women of childbearing potential should use effective methods of birth control during your cancer treatment. Let your doctor know right away if you think you may be pregnant or may have impregnated your partner.  . Breastfeeding warning: It is not known if this drug passes into breast milk. For this reason, women should talk to their doctor about the risks and benefits of breastfeeding during treatment with this drug because this drug may enter the breast milk and cause harm to a breastfeeding baby.  . Fertility warning: Human fertility studies have not been done with this drug. Talk with your doctor or nurse if you plan to have children. Ask for information on sperm or egg banking.   Leucovorin Calcium  About This Drug  Leucovorin is a vitamin. It is used in combination with other cancer fighting drugs such as 5-fluorouracil and methotrexate. Leucovorin is given in the vein (IV).  It is given to enhance the anti-cancer effects of  fluorouracil (5FU). It will infuse along with the oxaliplatin over 2 hours.   Possible Side Effects . Rash and itching  Note: Leucovorin by itself has very few side effects. Other side effects you may have can be caused by the other drugs you are taking, such as 5-fluorouracil.  Warnings and Precautions  . Allergic reactions, including anaphylaxis are rare but may happen in some patients.  Signs of allergic reaction to this drug may be swelling of the face, feeling like your tongue or throat are swelling, trouble breathing, rash, itching, fever, chills, feeling dizzy, and/or feeling that your heart is beating in a fast or not normal way. If this happens, do not take another dose of this drug. You should get urgent medical treatment.  Food and Drug Interactions  . There are no known interactions of leucovorin with food.  . This drug may interact with other medicines. Tell your doctor and pharmacist about all the prescription and over-the-counter medicines and dietary supplements (vitamins, minerals, herbs and others) that you are taking at this time.  . Also, check with your doctor or pharmacist before starting any new prescription or over-the-counter medicines, or dietary supplements to make sure that there are no interactions.  When to Call the Doctor  Call your doctor or nurse if you have any of these symptoms and/or any new or unusual symptoms:  . A new rash or a rash that is not relieved by prescribed medicines  . Signs of allergic reaction: swelling of the face, feeling like your tongue or throat are swelling, trouble breathing, rash, itching, fever, chills, feeling dizzy, and/or feeling that your heart is beating in a fast or not normal way. If this happens, call 911 for emergency care.  . If you think you may be pregnant  Reproduction Warnings  . Pregnancy warning: It is not known if this drug may harm an unborn child. For this reason, be sure to talk with your doctor if you are  pregnant or planning to become pregnant while receiving this drug. Let your doctor know right away if you think you may be pregnant  . Breastfeeding warning: It is not known if this drug passes into breast milk. For this reason, women should talk to their doctor about the risks and benefits of breastfeeding during treatment with this drug because this drug may enter the breast milk and cause harm to a breastfeeding baby.  . Fertility warning: Human fertility studies have not been done with this drug. Talk with your doctor or nurse if you plan to have children. Ask for information on sperm or egg banking.    Irinotecan Hydrochloride (Generic Name) Other Names: Camptosar, camptothecin-11, CPT-11   About this drug Irinotecan hydrochloride is used to treat cancer. This drug is given in the vein (IV). It takes 1.5 hours to infuse   Possible side effects (more common) . Loose bowel movements (diarrhea) that may last for a few days  . Bone marrow depression. This is a decrease in the number of white blood cells, red blood cells, and platelets. This may raise your risk of infection, make you tired and weak (fatigue), and raise your risk of bleeding.  . Nausea and throwing up (vomiting). These symptoms may happen within a few hours or many hours after your treatment and may last up to 24 hours. Medicines are available to stop or lessen these side effects.  . Hair loss: Most often hair loss is temporary; your hair should grow back when treatment is done.   Possible side effects (less common) . Skin and tissue irritation. You may have redness, pain, warmth, or swelling at the IV site.  . Weakness  . Trouble Breathing  . Decreased Appetite (decreased hunger)   Treating side effects . Drink 6-8 cups of fluids each day unless your doctor has told you to limit your fluid intake due to some other health problem. A  cup is 8 ounces of fluid. If you throw up or have loose bowel movements, you should  drink more fluids so that you do not become dehydrated (lack water in the body from losing too much fluid).  . Ask your doctor or nurse about medicine that is available to help stop or lessen the loose bowel movements.  . Talk with your nurse about getting a wig before you lose your hair. Also, call the [place] at 800-ACS-2345 to find out information about the "Look Good, Feel Better" program close to where you live. It is a free program where women getting chemotherapy can learn about wigs, turbans and scarves as well as makeup techniques and skin and nail care.  . Use effective methods of birth control during your cancer treatment. Talk with your doctor or nurse about options for birth control.  . Vaginal lubricants can be used to lessen vaginal dryness, itching, and pain during sexual relations.   Food and drug interactions There are no known interactions of irinotecan hydrochloride with food. This drug may interact with other medicines. Tell your doctor and pharmacist about all the medicines and dietary supplements (vitamins, minerals, herbs and others) that you are taking at this time. The safety and use of dietary supplements and alternative diets are often not known. Using these might affect your cancer or interfere with your treatment. Until more is known, you should not use dietary supplements or alternative diets without your cancer doctor's help.   When to call the doctor Call your doctor or nurse right away if you have any of these symptoms: . Loose bowel movements (diarrhea) more than 5 or 6 times a day or loose bowel movements with weakness or feeling lightheaded  . Temperature of 100.4 F (38 C) or above  . Chills  . Trouble breathing or feeling short of breath  . Easy bruising or bleeding  . Nausea that stops you from eating or drinking  . Throwing up  . Redness, pain, warmth, or swelling at the IV site  . Feeling dizzy, lightheaded, or if you pass out    Call your doctor or nurse as soon as possible if you have any of these symptoms:  . Nausea that is not relieved by prescribed medicines  . Extreme tiredness that interferes with normal activities   Sexual problems and reproduction concerns . Infertility warning: Sexual problems and reproduction concerns may happen. In both men and women, this drug may affect your ability to have children. This cannot be determined before your treatment. Talk with your doctor or nurse if you plan to have children. Ask for information on sperm or egg banking.  . In men, this drug may interfere with your ability to make sperm, but it should not change your ability to have sexual relations.  . In women, menstrual bleeding may become irregular or stop while you are getting this drug. Do not assume that you cannot become pregnant if you do not have a menstrual period. . Women may go through signs of menopause (change of life) like vaginal dryness or itching. Vaginal lubricants can be used to lessen vaginal dryness, itching, and pain during sexual relations.  . Genetic counseling is available for you to talk about the effects of this drug therapy on future pregnancies. Also, a genetic counselor can look at the possible risk of problems in the unborn baby due to this medicine if an exposure happens during pregnancy.  . Pregnancy warning: This drug may have harmful  effects on the unborn child, so effective methods of birth control should be used during your cancer treatment.  . Breast feeding warning: Women should not breast feed during treatment because this drug could enter the breast milk and badly harm a breast feeding baby.     5-Fluorouracil (Adrucil; 5FU)  About This Drug  Fluorouracil is used to treat cancer. It is given in the vein (IV). It is given as an IV push from a syringe and also as a continuous infusion given via an ambulatory pump (a pump you take home and wear for a specified amount of  time).  Possible Side Effects  . Bone marrow suppression. This is a decrease in the number of white blood cells, red blood cells, and platelets. This may raise your risk of infection, make you tired and weak (fatigue), and raise your risk of bleeding  . Changes in the tissue of the heart and/or heart attack. Some changes may happen that can cause your heart to have less ability to pump blood.  . Blurred vision or other changes in eyesight  . Nausea and throwing up (vomiting)  . Diarrhea (loose bowel movements)  . Ulcers - sores that may cause pain or bleeding in your digestive tract, which includes your mouth, esophagus, stomach, small/large intestines and rectum  . Soreness of the mouth and throat. You may have red areas, white patches, or sores that hurt.  . Allergic reactions, including anaphylaxis are rare but may happen in some patients. Signs of allergic reaction to this drug may be swelling of the face, feeling like your tongue or throat are swelling, trouble breathing, rash, itching, fever, chills, feeling dizzy, and/or feeling that your heart is beating in a fast or not normal way. If this happens, do not take another dose of this drug. You should get urgent medical treatment.  . Sensitivity to light (photosensitivity). Photosensitivity means that you may become more sensitive to the sun and/or light. You may get a skin rash/reaction if you are in the sun or are exposed to sun lamps and tanning beds. Your eyes may water more, mostly in bright light.  . Changes in your nail color, nail loss and/or brittle nail  . Darkening of the skin, or changes to the color of your skin and/or veins used for infusion  . Rash, dry skin, or itching  Note: Not all possible side effects are included above.  Warnings and Precautions  . Hand-and-foot syndrome. The palms of your hands or soles of your feet may tingle, become numb, painful, swollen, or red.  . Changes in your central nervous  system can happen. The central nervous system is made up of your brain and spinal cord. You could feel extreme tiredness, agitation, confusion, hallucinations (see or hear things that are not there), trouble understanding or speaking, loss of control of your bowels or bladder, eyesight changes, numbness or lack of strength to your arms, legs, face, or body, or coma. If you start to have any of these symptoms let your doctor know right away.  . Side effects of this drug may be unexpectedly severe in some patients  Note: Some of the side effects above are very rare. If you have concerns and/or questions, please discuss them with your medical team.  Important Information  . This drug may be present in the saliva, tears, sweat, urine, stool, vomit, semen, and vaginal secretions. Talk to your doctor and/or your nurse about the necessary precautions to take during this time.  Treating Side Effects  . Manage tiredness by pacing your activities for the day.  . Be sure to include periods of rest between energy-draining activities.  . To help decrease the risk of infections, wash your hands regularly.  . Avoid close contact with people who have a cold, the flu, or other infections.  . Take your temperature as your doctor or nurse tells you, and whenever you feel like you may have a fever.  . Use a soft toothbrush. Check with your nurse before using dental floss.  . Be very careful when using knives or tools.  . Use an electric shaver instead of a razor.  . If you have a nose bleed, sit with your head tipped slightly forward. Apply pressure by lightly pinching the bridge of your nose between your thumb and forefinger. Call your doctor if you feel dizzy or faint or if the bleeding doesn't stop after 10 to 15 minutes.  . Drink plenty of fluids (a minimum of eight glasses per day is recommended).  . If you throw up or have loose bowel movements, you should drink more fluids so that you do not  become dehydrated (lack of water in the body from losing too much fluid).  . To help with nausea and vomiting, eat small, frequent meals instead of three large meals a day. Choose foods and drinks that are at room temperature. Ask your nurse or doctor about other helpful tips and medicine that is available to help, stop, or lessen these symptoms.  . If you have diarrhea, eat low-fiber foods that are high in protein and calories and avoid foods that can irritate your digestive tracts or lead to cramping.  . Ask your nurse or doctor about medicine that can lessen or stop your diarrhea.  . Mouth care is very important. Your mouth care should consist of routine, gentle cleaning of your teeth or dentures and rinsing your mouth with a mixture of 1/2 teaspoon of salt in 8 ounces of water or 1/2 teaspoon of baking soda in 8 ounces of water. This should be done at least after each meal and at bedtime.  . If you have mouth sores, avoid mouthwash that has alcohol. Also avoid alcohol and smoking because they can bother your mouth and throat.  [name] Keeping your nails moisturized may help with brittleness.  . To help with itching, moisturize your skin several times day.  . Use sunscreen with SPF 30 or higher when you are outdoors even for a short time. Cover up when you are out in the sun. Wear wide-brimmed hats, long-sleeved shirts, and pants. Keep your neck, chest, and back covered. Wear dark sun glasses when in the sun or bright lights.  . If you get a rash do not put anything on it unless your doctor or nurse says you may. Keep the area around the rash clean and dry. Ask your doctor for medicine if your rash bothers you.  [name] Keeping your pain under control is important to your well-being. Please tell your doctor or nurse if you are experiencing pain.  Food and Drug Interactions  . There are no known interactions of fluorouracil with food.  . Check with your doctor or pharmacist about all other  prescription medicines and over-the-counter medicines and dietary supplements (vitamins, minerals, herbs and others) you are taking before starting this medicine as there are known drug interactions with 5-fluoroucacil. Also, check with your doctor or pharmacist before starting any new prescription or over-the-counter medicines, or  dietary supplements to make sure that there are no interactions.  When to Call the Doctor  Call your doctor or nurse if you have any of these symptoms and/or any new or unusual symptoms:  . Fever of 100.4 F (38 C) or higher  . Chills  . Easy bleeding or bruising  . Nose bleed that doesn't stop bleeding after 10-15 minutes  . Trouble breathing  . Feeling dizzy or lightheaded  . Feeling that your heart is beating in a fast or not normal way (palpitations)  . Chest pain or symptoms of a heart attack. Most heart attacks involve pain in the center of the chest that lasts more than a few minutes. The pain may go away and come back or it can be constant. It can feel like pressure, squeezing, fullness, or pain. Sometimes pain is felt in one or both arms, the back, neck, jaw, or stomach. If any of these symptoms last 2 minutes, call 911.  [name] Confusion and/or agitation  . Hallucinations  . Trouble understanding or speaking  . Loss of control of bowels or bladder  . Blurry vision or changes in your eyesight  . Headache that does not go away  . Numbness or lack of strength to your arms, legs, face, or body  . Nausea that stops you from eating or drinking and/or is not relieved by prescribed medicines  . Throwing up  . Diarrhea, 4 times in one day or diarrhea with lack of strength or a feeling of being dizzy  . Pain in your mouth or throat that makes it hard to eat or drink  . Pain along the digestive tract - especially if worse after eating  . Blood in your vomit (bright red or coffee-ground) and/or stools (bright red, or black/tarry)  . Coughing up  blood  . Tiredness that interferes with your daily activities  . Painful, red, or swollen areas on your hands or feet or around your nails  . A new rash or a rash that is not relieved by prescribed medicines  . Develop sensitivity to sunlight/light  . Numbness and/or tingling of your hands and/or feet  . Signs of allergic reaction: swelling of the face, feeling like your tongue or throat are swelling, trouble breathing, rash, itching, fever, chills, feeling dizzy, and/or feeling that your heart is beating in a fast or not normal way. If this happens, call 911 for emergency care.  . If you think you are pregnant or may have impregnated your partner  Reproduction Warnings  . Pregnancy warning: This drug may have harmful effects on the unborn baby. Women of child bearing potential should use effective methods of birth control during your cancer treatment and 3 months after treatment. Men with male partners of childbearing potential should use effective methods of birth control during your cancer treatment and for 3 months after your cancer treatment. Let your doctor know right away if you think you may be pregnant or may have impregnated your partner.  . Breastfeeding warning: It is not known if this drug passes into breast milk. For this reason, Women should not breastfeed during treatment because this drug could enter the breast milk and cause harm to a breastfeeding baby.  . Fertility warning: In men and women both, this drug may affect your ability to have children in the future. Talk with your doctor or nurse if you plan to have children. Ask for information on sperm or egg banking.  SELF CARE ACTIVITIES WHILE RECEIVING CHEMOTHERAPY:  Hydration Increase your fluid intake 48 hours prior to treatment and drink at least 8 to 12 cups (64 ounces) of water/decaffeinated beverages per day after treatment. You can still have your cup of coffee or soda but these beverages do not count as part  of your 8 to 12 cups that you need to drink daily. No alcohol intake.  Medications Continue taking your normal prescription medication as prescribed.  If you start any new herbal or new supplements please let us know first to make sure it is safe.  Mouth Care Have teeth cleaned professionally before starting treatment. Keep dentures and partial plates clean. Use soft toothbrush and do not use mouthwashes that contain alcohol. Biotene is a good mouthwash that is available at most pharmacies or may be ordered by calling [phone]. Use warm salt water gargles (1 teaspoon salt per 1 quart warm water) before and after meals and at bedtime. If you need dental work, please let the doctor know before you go for your appointment so that we can coordinate the best possible time for you in regards to your chemo regimen. You need to also let your dentist know that you are actively taking chemo. We may need to do labs prior to your dental appointment.  Skin Care Always use sunscreen that has not expired and with SPF (Sun Protection Factor) of 50 or higher. Wear hats to protect your head from the sun. Remember to use sunscreen on your hands, ears, face, & feet.  Use good moisturizing lotions such as udder cream, eucerin, or even Vaseline. Some chemotherapies can cause dry skin, color changes in your skin and nails.    . Avoid long, hot showers or baths. . Use gentle, fragrance-free soaps and laundry detergent. . Use moisturizers, preferably creams or ointments rather than lotions because the thicker consistency is better at preventing skin dehydration. Apply the cream or ointment within 15 minutes of showering. Reapply moisturizer at night, and moisturize your hands every time after you wash them.  Hair Loss (if your doctor says your hair will fall out)  . If your doctor says that your hair is likely to fall out, decide before you begin chemo whether you want to wear a wig. You may want to shop before  treatment to match your hair color. . Hats, turbans, and scarves can also camouflage hair loss, although some people prefer to leave their heads uncovered. If you go bare-headed outdoors, be sure to use sunscreen on your scalp. . Cut your hair short. It eases the inconvenience of shedding lots of hair, but it also can reduce the emotional impact of watching your hair fall out. . Don't perm or color your hair during chemotherapy. Those chemical treatments are already damaging to hair and can enhance hair loss. Once your chemo treatments are done and your hair has grown back, it's OK to resume dyeing or perming hair.  With chemotherapy, hair loss is almost always temporary. But when it grows back, it may be a different color or texture. In older adults who still had hair color before chemotherapy, the new growth may be completely gray.  Often, new hair is very fine and soft.  Infection Prevention Please wash your hands for at least 30 seconds using warm soapy water. Handwashing is the #1 way to prevent the spread of germs. Stay away from sick people or people who are getting over a cold. If you develop respiratory systems such as green/yellow mucus production or productive cough or persistent  cough let us know and we will see if you need an antibiotic. It is a good idea to keep a pair of gloves on when going into grocery stores/[employer] to decrease your risk of coming into contact with germs on the carts, etc. Carry alcohol hand gel with you at all times and use it frequently if out in public. If your temperature reaches 100.5 or higher please call the clinic and let us know.  If it is after hours or on the weekend please go to the ER if your temperature is over 100.5.  Please have your own personal thermometer at home to use.    Sex and bodily fluids If you are going to have sex, a condom must be used to protect the person that isn't taking chemotherapy. Chemo can decrease your libido (sex drive). For a few  days after chemotherapy, chemotherapy can be excreted through your bodily fluids.  When using the toilet please close the lid and flush the toilet twice.  Do this for a few day after you have had chemotherapy.   Effects of chemotherapy on your sex life Some changes are simple and won't last long. They won't affect your sex life permanently.  Sometimes you may feel: . too tired . not strong enough to be very active . sick or sore  . not in the mood . anxious or low Your anxiety might not seem related to sex. For example, you may be worried about the cancer and how your treatment is going. Or you may be worried about money, or about how you family are coping with your illness.  These things can cause stress, which can affect your interest in sex. It's important to talk to your partner about how you feel.  Remember - the changes to your sex life don't usually last long. There's usually no medical reason to stop having sex during chemo. The drugs won't have any long term physical effects on your performance or enjoyment of sex. Cancer can't be passed on to your partner during sex  Contraception It's important to use reliable contraception during treatment. Avoid getting pregnant while you or your partner are having chemotherapy. This is because the drugs may harm the baby. Sometimes chemotherapy drugs can leave a man or woman infertile.  This means you would not be able to have children in the future. You might want to talk to someone about permanent infertility. It can be very difficult to learn that you may no longer be able to have children. Some people find counselling helpful. There might be ways to preserve your fertility, although this is easier for men than for women. You may want to speak to a fertility expert. You can talk about sperm banking or harvesting your eggs. You can also ask about other fertility options, such as donor eggs. If you have or have had breast cancer, your doctor might advise  you not to take the contraceptive pill. This is because the hormones in it might affect the cancer. It is not known for sure whether or not chemotherapy drugs can be passed on through semen or secretions from the vagina. Because of this some doctors advise people to use a barrier method if you have sex during treatment. This applies to vaginal, anal or oral sex. Generally, doctors advise a barrier method only for the time you are actually having the treatment and for about a week after your treatment. Advice like this can be worrying, but this does not mean that  you have to avoid being intimate with your partner. You can still have close contact with your partner and continue to enjoy sex.  Animals If you have cats or birds we just ask that you not change the litter or change the cage.  Please have someone else do this for you while you are on chemotherapy.   Food Safety During and After Cancer Treatment Food safety is important for people both during and after cancer treatment. Cancer and cancer treatments, such as chemotherapy, radiation therapy, and stem cell/bone marrow transplantation, often weaken the immune system. This makes it harder for your body to protect itself from foodborne illness, also called food poisoning. Foodborne illness is caused by eating food that contains harmful bacteria, parasites, or viruses.  Foods to avoid Some foods have a higher risk of becoming tainted with bacteria. These include: [name] Unwashed fresh fruit and vegetables, especially leafy vegetables that can hide dirt and other contaminants . Raw sprouts, such as alfalfa sprouts . Raw or undercooked beef, especially ground beef, or other raw or undercooked meat and poultry . Fatty, fried, or spicy foods immediately before or after treatment.  These can sit heavy on your stomach and make you feel nauseous. . Raw or undercooked shellfish, such as oysters. . Sushi and sashimi, which often contain raw fish.   . Unpasteurized beverages, such as unpasteurized fruit juices, raw milk, raw yogurt, or cider . Undercooked eggs, such as soft boiled, over easy, and poached; raw, unpasteurized eggs; or foods made with raw egg, such as homemade raw cookie dough and homemade mayonnaise  Simple steps for food safety  Shop smart. . Do not buy food stored or displayed in an unclean area. . Do not buy bruised or damaged fruits or vegetables. . Do not buy cans that have cracks, dents, or bulges. . Pick up foods that can spoil at the end of your shopping trip and store them in a cooler on the way home.  Prepare and clean up foods carefully. . Rinse all fresh fruits and vegetables under running water, and dry them with a clean towel or paper towel. . Clean the top of cans before opening them. . After preparing food, wash your hands for 20 seconds with hot water and soap. Pay special attention to areas between fingers and under nails. . Clean your utensils and dishes with hot water and soap. [name] Disinfect your kitchen and cutting boards using 1 teaspoon of liquid, unscented bleach mixed into 1 quart of water.    Dispose of old food. . Eat canned and packaged food before its expiration date (the "use by" or "best before" date). . Consume refrigerated leftovers within 3 to 4 days. After that time, throw out the food. Even if the food does not smell or look spoiled, it still may be unsafe. Some bacteria, such as Listeria, can grow even on foods stored in the refrigerator if they are kept for too long.  Take precautions when eating out. . At restaurants, avoid buffets and salad bars where food sits out for a long time and comes in contact with many people. Food can become contaminated when someone with a virus, often a norovirus, or another "bug" handles it. . Put any leftover food in a "to-go" container yourself, rather than having the server do it. And, refrigerate leftovers as soon as you get home. . Choose  restaurants that are clean and that are willing to prepare your food as you order it cooked.   AT  HOME MEDICATIONS:                                                                                                                                                                Compazine/Prochlorperazine 10mg  tablet. Take 1 tablet every 6 hours as needed for nausea/vomiting. (This can make you sleepy)   EMLA cream. Apply a quarter size amount to port site 1 hour prior to chemo. Do not rub in. Cover with plastic wrap.    Diarrhea Sheet   If you are having loose stools/diarrhea, please purchase Imodium and begin taking as outlined:  At the first sign of poorly formed or loose stools you should begin taking Imodium (loperamide) 2 mg capsules.  Take two tablets (4mg ) followed by one tablet (2mg ) every 2 hours - DO NOT EXCEED 8 tablets in 24 hours.  If it is bedtime and you are having loose stools, take 2 tablets at bedtime, then 2 tablets every 4 hours until morning.   Always call the [facility] if you are having loose stools/diarrhea that you can't get under control.  Loose stools/diarrhea leads to dehydration (loss of water) in your body.  We have other options of trying to get the loose stools/diarrhea to stop but you must let us know!   Constipation Sheet  Colace - 100 mg capsules - take 2 capsules daily.  If this doesn't help then you can increase to 2 capsules twice daily.  Please call if the above does not work for you. Do not go more than 2 days without a bowel movement.  It is very important that you do not become constipated.  It will make you feel sick to your stomach (nausea) and can cause abdominal pain and vomiting.  Nausea Sheet   Compazine/Prochlorperazine 10mg  tablet. Take 1 tablet every 6 hours as needed for nausea/vomiting (This can make you drowsy).  If you are having persistent nausea (nausea that does not stop) please call the [facility] and let us know the amount of  nausea that you are experiencing.  If you begin to vomit, you need to call the [facility] and if it is the weekend and you have vomited more than one time and can't get it to stop-go to the Emergency Room.  Persistent nausea/vomiting can lead to dehydration (loss of fluid in your body) and will make you feel very weak and unwell. Ice chips, sips of clear liquids, foods that are at room temperature, crackers, and toast tend to be better tolerated.   SYMPTOMS TO REPORT AS SOON AS POSSIBLE AFTER TREATMENT:  FEVER GREATER THAN 100.5 F  CHILLS WITH OR WITHOUT FEVER  NAUSEA AND VOMITING THAT IS NOT CONTROLLED WITH YOUR NAUSEA MEDICATION  UNUSUAL SHORTNESS OF BREATH  UNUSUAL BRUISING OR BLEEDING  TENDERNESS IN MOUTH AND THROAT WITH OR WITHOUT   PRESENCE OF ULCERS  URINARY PROBLEMS  BOWEL PROBLEMS  UNUSUAL RASH      Wear comfortable clothing and clothing appropriate for easy access to any Portacath or PICC line. Let us know if there is anything that we can do to make your therapy better!    What to do if you need assistance after hours or on the weekends: CALL [phone].  HOLD on the line, do not hang up.  You will hear multiple messages but at the end you will be connected with a nurse triage line.  They will contact the doctor if necessary.  Most of the time they will be able to assist you.  Do not call the hospital operator.      I have been informed and understand all of the instructions given to me and have received a copy. I have been instructed to call the clinic [phone] or my family physician as soon as possible for continued medical care, if indicated. I do not have any more questions at this time but understand that I may call the [facility] or the Patient Navigator at [phone] during office hours should I have questions or need assistance in obtaining follow-up care.

## 2019-04-18 ENCOUNTER — Other Ambulatory Visit: Payer: Self-pay

## 2019-04-18 ENCOUNTER — Inpatient Hospital Stay (HOSPITAL_COMMUNITY): Payer: BC Managed Care – PPO

## 2019-04-18 DIAGNOSIS — C250 Malignant neoplasm of head of pancreas: Secondary | ICD-10-CM

## 2019-04-18 DIAGNOSIS — Z95828 Presence of other vascular implants and grafts: Secondary | ICD-10-CM

## 2019-04-18 NOTE — Progress Notes (Signed)

## 2019-04-22 ENCOUNTER — Other Ambulatory Visit (HOSPITAL_COMMUNITY): Payer: Self-pay | Admitting: *Deleted

## 2019-04-22 ENCOUNTER — Encounter (HOSPITAL_COMMUNITY): Payer: Self-pay | Admitting: *Deleted

## 2019-04-22 DIAGNOSIS — C250 Malignant neoplasm of head of pancreas: Secondary | ICD-10-CM | POA: Diagnosis not present

## 2019-04-22 DIAGNOSIS — Z720 Tobacco use: Secondary | ICD-10-CM | POA: Diagnosis not present

## 2019-04-22 DIAGNOSIS — K7460 Unspecified cirrhosis of liver: Secondary | ICD-10-CM | POA: Diagnosis not present

## 2019-04-22 MED ORDER — HYDROCODONE-ACETAMINOPHEN 5-325 MG PO TABS: 1.0000 | ORAL_TABLET | ORAL | 0 refills | Status: DC | PRN

## 2019-04-23 ENCOUNTER — Other Ambulatory Visit: Payer: Self-pay | Admitting: Medical

## 2019-04-23 ENCOUNTER — Inpatient Hospital Stay (HOSPITAL_COMMUNITY): Payer: BC Managed Care – PPO

## 2019-04-23 ENCOUNTER — Other Ambulatory Visit: Payer: Self-pay

## 2019-04-23 ENCOUNTER — Encounter (HOSPITAL_COMMUNITY): Payer: Self-pay | Admitting: Hematology

## 2019-04-23 ENCOUNTER — Inpatient Hospital Stay (HOSPITAL_BASED_OUTPATIENT_CLINIC_OR_DEPARTMENT_OTHER): Payer: BC Managed Care – PPO | Admitting: Hematology

## 2019-04-23 VITALS — BP 137/72 | HR 60 | Temp 98.1°F | Resp 18

## 2019-04-23 DIAGNOSIS — Z95828 Presence of other vascular implants and grafts: Secondary | ICD-10-CM

## 2019-04-23 DIAGNOSIS — R202 Paresthesia of skin: Secondary | ICD-10-CM | POA: Diagnosis not present

## 2019-04-23 DIAGNOSIS — K5900 Constipation, unspecified: Secondary | ICD-10-CM | POA: Diagnosis not present

## 2019-04-23 DIAGNOSIS — Z806 Family history of leukemia: Secondary | ICD-10-CM | POA: Diagnosis not present

## 2019-04-23 DIAGNOSIS — Z79899 Other long term (current) drug therapy: Secondary | ICD-10-CM | POA: Diagnosis not present

## 2019-04-23 DIAGNOSIS — Z801 Family history of malignant neoplasm of trachea, bronchus and lung: Secondary | ICD-10-CM | POA: Diagnosis not present

## 2019-04-23 DIAGNOSIS — C259 Malignant neoplasm of pancreas, unspecified: Secondary | ICD-10-CM | POA: Diagnosis not present

## 2019-04-23 DIAGNOSIS — Z8041 Family history of malignant neoplasm of ovary: Secondary | ICD-10-CM | POA: Diagnosis not present

## 2019-04-23 DIAGNOSIS — C250 Malignant neoplasm of head of pancreas: Secondary | ICD-10-CM | POA: Diagnosis not present

## 2019-04-23 DIAGNOSIS — R1013 Epigastric pain: Secondary | ICD-10-CM | POA: Diagnosis not present

## 2019-04-23 DIAGNOSIS — Z8051 Family history of malignant neoplasm of kidney: Secondary | ICD-10-CM | POA: Diagnosis not present

## 2019-04-23 DIAGNOSIS — Z8042 Family history of malignant neoplasm of prostate: Secondary | ICD-10-CM | POA: Diagnosis not present

## 2019-04-23 DIAGNOSIS — Z800 Family history of malignant neoplasm of digestive organs: Secondary | ICD-10-CM | POA: Diagnosis not present

## 2019-04-23 DIAGNOSIS — Z5189 Encounter for other specified aftercare: Secondary | ICD-10-CM | POA: Diagnosis not present

## 2019-04-23 DIAGNOSIS — K769 Liver disease, unspecified: Secondary | ICD-10-CM | POA: Diagnosis not present

## 2019-04-23 DIAGNOSIS — Z808 Family history of malignant neoplasm of other organs or systems: Secondary | ICD-10-CM | POA: Diagnosis not present

## 2019-04-23 DIAGNOSIS — Z5111 Encounter for antineoplastic chemotherapy: Secondary | ICD-10-CM | POA: Diagnosis not present

## 2019-04-23 DIAGNOSIS — F17210 Nicotine dependence, cigarettes, uncomplicated: Secondary | ICD-10-CM | POA: Diagnosis not present

## 2019-04-23 LAB — CBC WITH DIFFERENTIAL/PLATELET
Abs Immature Granulocytes: 0.01 10*3/uL (ref 0.00–0.07)
Basophils Absolute: 0 10*3/uL (ref 0.0–0.1)
Basophils Relative: 1 %
Eosinophils Absolute: 0.3 10*3/uL (ref 0.0–0.5)
Eosinophils Relative: 4 %
HCT: 40.7 % (ref 39.0–52.0)
Hemoglobin: 13.2 g/dL (ref 13.0–17.0)
Immature Granulocytes: 0 %
Lymphocytes Relative: 48 %
Lymphs Abs: 3.4 10*3/uL (ref 0.7–4.0)
MCH: 32.8 pg (ref 26.0–34.0)
MCHC: 32.4 g/dL (ref 30.0–36.0)
MCV: 101.2 fL — ABNORMAL HIGH (ref 80.0–100.0)
Monocytes Absolute: 0.6 10*3/uL (ref 0.1–1.0)
Monocytes Relative: 8 %
Neutro Abs: 2.8 10*3/uL (ref 1.7–7.7)
Neutrophils Relative %: 39 %
Platelets: 182 10*3/uL (ref 150–400)
RBC: 4.02 MIL/uL — ABNORMAL LOW (ref 4.22–5.81)
RDW: 13 % (ref 11.5–15.5)
WBC: 7.1 10*3/uL (ref 4.0–10.5)
nRBC: 0 % (ref 0.0–0.2)

## 2019-04-23 LAB — COMPREHENSIVE METABOLIC PANEL
ALT: 40 U/L (ref 0–44)
AST: 39 U/L (ref 15–41)
Albumin: 3.6 g/dL (ref 3.5–5.0)
Alkaline Phosphatase: 59 U/L (ref 38–126)
Anion gap: 7 (ref 5–15)
BUN: 20 mg/dL (ref 8–23)
CO2: 25 mmol/L (ref 22–32)
Calcium: 8.7 mg/dL — ABNORMAL LOW (ref 8.9–10.3)
Chloride: 104 mmol/L (ref 98–111)
Creatinine, Ser: 0.68 mg/dL (ref 0.61–1.24)
GFR calc Af Amer: >60 mL/min (ref >60)
GFR calc non Af Amer: >60 mL/min (ref >60)
Glucose, Bld: 180 mg/dL — ABNORMAL HIGH (ref 70–99)
Potassium: 4.1 mmol/L (ref 3.5–5.1)
Sodium: 136 mmol/L (ref 135–145)
Total Bilirubin: 0.7 mg/dL (ref 0.3–1.2)
Total Protein: 7.3 g/dL (ref 6.5–8.1)

## 2019-04-23 MED ORDER — ATROPINE SULFATE 1 MG/ML IJ SOLN
0.5000 mg | Freq: Once | INTRAMUSCULAR | Status: AC | PRN
  Administered 2019-04-23: 0.5 mg via INTRAVENOUS
  Filled 2019-04-23: qty 1

## 2019-04-23 MED ORDER — SODIUM CHLORIDE 0.9 % IV SOLN
150.0000 mg | Freq: Once | INTRAVENOUS | Status: AC
  Administered 2019-04-23: 150 mg via INTRAVENOUS
  Filled 2019-04-23: qty 150

## 2019-04-23 MED ORDER — DEXTROSE 5 % IV SOLN: Freq: Once | INTRAVENOUS | Status: AC

## 2019-04-23 MED ORDER — SODIUM CHLORIDE 0.9 % IV SOLN
120.0000 mg/m2 | Freq: Once | INTRAVENOUS | Status: AC
  Administered 2019-04-23: 240 mg via INTRAVENOUS
  Filled 2019-04-23: qty 10

## 2019-04-23 MED ORDER — SODIUM CHLORIDE 0.9 % IV SOLN
1920.0000 mg/m2 | INTRAVENOUS | Status: DC
  Administered 2019-04-23: 3700 mg via INTRAVENOUS
  Filled 2019-04-23: qty 74

## 2019-04-23 MED ORDER — PALONOSETRON HCL INJECTION 0.25 MG/5ML
0.2500 mg | Freq: Once | INTRAVENOUS | Status: AC
  Administered 2019-04-23: 0.25 mg via INTRAVENOUS
  Filled 2019-04-23: qty 5

## 2019-04-23 MED ORDER — SODIUM CHLORIDE 0.9 % IV SOLN
320.0000 mg/m2 | Freq: Once | INTRAVENOUS | Status: AC
  Administered 2019-04-23: 618 mg via INTRAVENOUS
  Filled 2019-04-23: qty 30.9

## 2019-04-23 MED ORDER — SODIUM CHLORIDE 0.9 % IV SOLN
10.0000 mg | Freq: Once | INTRAVENOUS | Status: AC
  Administered 2019-04-23: 10 mg via INTRAVENOUS
  Filled 2019-04-23: qty 10

## 2019-04-23 MED ORDER — OXALIPLATIN CHEMO INJECTION 100 MG/20ML
68.0000 mg/m2 | Freq: Once | INTRAVENOUS | Status: AC
  Administered 2019-04-23: 130 mg via INTRAVENOUS
  Filled 2019-04-23: qty 20

## 2019-04-23 MED ORDER — SODIUM CHLORIDE 0.9% FLUSH
10.0000 mL | INTRAVENOUS | Status: DC | PRN
  Administered 2019-04-23 (×2): 10 mL

## 2019-04-23 NOTE — Patient Instructions (Addendum)
[place] at [facility] Discharge Instructions  You were seen today by Dr. Delton Coombes. He went over your recent results. Have Imodium on hand for diarrhea. Be sure that you drink fluids at room temperature. Start taking your nausea medication every 6 hours until you come back in 3 days. He will see you back in 1 week for labs and follow up.   Thank you for choosing [place] at [facility] to provide your oncology and hematology care.  To afford each patient quality time with our provider, please arrive at least 15 minutes before your scheduled appointment time.   If you have a lab appointment with the [facility] please come in thru the  Main Entrance and check in at the main information desk  You need to re-schedule your appointment should you arrive 10 or more minutes late.  We strive to give you quality time with our providers, and arriving late affects you and other patients whose appointments are after yours.  Also, if you no show three or more times for appointments you may be dismissed from the clinic at the providers discretion.     Again, thank you for choosing [facility].  Our hope is that these requests will decrease the amount of time that you wait before being seen by our physicians.       _____________________________________________________________  Should you have questions after your visit to [facility], please contact our office at [phone] between the hours of 8:00 a.m. and 4:30 p.m.  Voicemails left after 4:00 p.m. will not be returned until the following business day.  For prescription refill requests, have your pharmacy contact our office and allow 72 hours.    Cancer Center Support Programs:   > Cancer Support Group  2nd Tuesday of the month 1pm-2pm, Journey Room

## 2019-04-23 NOTE — Progress Notes (Signed)
Labs reviewed with MD today . Proceed with day one of treatment as planned.

## 2019-04-23 NOTE — Assessment & Plan Note (Signed)
1.  Pancreatic adenocarcinoma (UT4 N1 MX): -EGD/EUS on 03/11/2019 with mass in the pancreatic head.  Few malignant appearing lymph nodes were visualized in the porta hepatic region.  Largest measured 26 x 23 mm and was biopsied. -Pathology consistent with adenocarcinoma of the biopsy of the pancreatic head mass as well as lymph node. -PET scan on 03/28/2019 did not show any metastatic disease.  CA 19-9 was 40 on 02/13/2019. -CT AP with contrast on 03/12/2019 shows pancreatic duct dilatation in the body and tail.  Hypodense lesion measuring 1.4 x 1.3 1.3 cm, difficult to define on CT.  Lesion appears to contact the ventral surface of the proximal portal vein at the junction of the 70/15 millimeters segment.  SMA appears clear of the lesion. -He was evaluated by Dr. Barry Dienes on 04/22/2019.  She has recommended CT AP pancreatic protocol upon completion of neoadjuvant chemotherapy.  We will also do germline mutation testing, MSI testing and next-generation sequencing. -I have recommended neoadjuvant FOLFIRINOX.  We discussed about the side effects in detail.  Because of his mild cirrhosis, I will start him at 20% dose reduction.  If he tolerates well, we will increase it to regular dose. -I have reviewed his labs which are grossly within normal limits.  He will proceed with cycle 1 today.  He will also receive growth factor support. -I will reevaluate him in 1 week.  2.  Liver disease: -Recent CT scan showed mild degree of cirrhosis.  He has a history of hep C which was untreated.  LFTs including albumin and bilirubin are normal.  3.  Epigastric pain: -He has throbbing pain in the epigastric region for which he takes hydrocodone 5/325 every 4 hours as needed.  He will continue stool softener as needed.

## 2019-04-23 NOTE — Telephone Encounter (Signed)
Is this appropriate?  

## 2019-04-23 NOTE — Progress Notes (Signed)
Patient tolerated chemotherapy with no complaints voiced.  Side effects with management reviewed with understanding verbalized.  Port site clean and dry with no bruising or swelling noted at site.  Good blood return noted before and after administration of chemotherapy.  Chemotherapy pump connected with review for pump. Packet given with infusystem number.  Patient and sister verbalized understanding.  Patient left ambulatory with VSS and no s/s of distress noted.

## 2019-04-23 NOTE — Progress Notes (Signed)
[place] Alexander, [place] Alexander [id number]   CLINIC:  Medical Oncology/Hematology  PCP:  Duane Alexander [place] [address] [phone]   REASON FOR VISIT:  Follow-up for pancreatic adenocarcinoma.  CURRENT THERAPY: Neoadjuvant chemotherapy with FOLFIRINOX.  BRIEF ONCOLOGIC HISTORY:  Oncology History  Pancreatic cancer ([place])  03/28/2019 Initial Diagnosis   Pancreatic cancer ([place])   03/28/2019 Cancer Staging   Staging form: Exocrine Pancreas, AJCC 8th Edition - Clinical stage from 03/28/2019: Stage III (cT4, cN1, cM0) - Signed by [place] Jack, MD on 03/28/2019   04/23/2019 -  Chemotherapy   [place] patient had palonosetron (ALOXI) injection 0.25 mg, 0.25 mg, Intravenous,  Once, 1 of 4 cycles Administration: 0.25 mg (04/23/2019) pegfilgrastim-cbqv (UDENYCA) injection 6 mg, 6 mg, Subcutaneous, Once, 1 of 4 cycles irinotecan (CAMPTOSAR) 240 mg in sodium chloride 0.9 % 500 mL chemo infusion, 120 mg/m2 = 240 mg (80 % of original dose 150 mg/m2), Intravenous,  Once, 1 of 4 cycles Dose modification: 120 mg/m2 (80 % of original dose 150 mg/m2, Cycle 1, Reason: Other (see comments), Comment: cirrhosis) Administration: 240 mg (04/23/2019) oxaliplatin (ELOXATIN) 130 mg in dextrose 5 % 500 mL chemo infusion, 68 mg/m2 = 130 mg (80 % of original dose 85 mg/m2), Intravenous,  Once, 1 of 4 cycles Dose modification: 68 mg/m2 (80 % of original dose 85 mg/m2, Cycle 1, Reason: Other (see comments), Comment: cirrhosis) Administration: 130 mg (04/23/2019) fosaprepitant (EMEND) 150 mg in sodium chloride 0.9 % 145 mL IVPB, 150 mg, Intravenous,  Once, 1 of 4 cycles Administration: 150 mg (04/23/2019) fluorouracil (ADRUCIL) 3,700 mg in sodium chloride 0.9 % 76 mL chemo infusion, 1,920 mg/m2 = 3,700 mg (80 % of original dose 2,400 mg/m2), Intravenous, 1 Day/Dose, 1 of 4 cycles Dose modification: 1,920 mg/m2 (80 % of original dose 2,400 mg/m2, Cycle 1,  Reason: Other (see comments), Comment: cirrhosis) Administration: 3,700 mg (04/23/2019) leucovorin 618 mg in sodium chloride 0.9 % 250 mL infusion, 320 mg/m2 = 618 mg (80 % of original dose 400 mg/m2), Intravenous,  Once, 1 of 4 cycles Dose modification: 320 mg/m2 (80 % of original dose 400 mg/m2, Cycle 1, Reason: Other (see comments), Comment: cirrhosis) Administration: 618 mg (04/23/2019)  for chemotherapy treatment.       CANCER STAGING: Cancer Staging Pancreatic cancer [facility]) Staging form: Exocrine Pancreas, AJCC 8th Edition - Clinical stage from 03/28/2019: Stage III (cT4, cN1, cM0) - Signed by Duane Jack, MD on 03/28/2019    INTERVAL HISTORY:  Duane Alexander 64 y.o. male seen for follow-up of pancreatic adenocarcinoma.  He has a port placed.  He reports appetite and energy levels of 100%.  Pain in well controlled in [place] epigastric region.  Denies any diarrhea.  Denies any new numbness in [place] extremities although he has chronic numbness in [place] fingertips from back surgery.    REVIEW OF SYSTEMS:  Review of Systems  Gastrointestinal: Positive for abdominal pain.  Neurological: Positive for numbness.  All other systems reviewed and are negative.    PAST MEDICAL/SURGICAL HISTORY:  Past Medical History:  Diagnosis Date  . Aortic atherosclerosis ([place])   . COPD (chronic obstructive pulmonary disease) ([place])    per CT findings 2019  . Family history of brain cancer   . Family history of colon cancer   . Family history of kidney cancer   . Family history of leukemia   . Family history of lung cancer   . Family history of ovarian  cancer   . Family history of prostate cancer   . Family history of stomach cancer   . GERD (gastroesophageal reflux disease)   . Hepatitis C    referred to hepatitis clinic 2019 but he never went  . Hyperlipidemia   . Hypertension   . Port-A-Cath in place 04/17/2019  . Smoker   . Wears glasses    Past Surgical History:  Procedure  Laterality Date  . ANTERIOR CERVICAL DECOMP/DISCECTOMY FUSION  12/2017   Dr. Consuella Lose  . BIOPSY  03/11/2019   Procedure: BIOPSY;  Surgeon: [name]., MD;  Location: [facility];  Service: Gastroenterology;;  . COLONOSCOPY N/A 01/19/2017   Procedure: COLONOSCOPY;  Surgeon: Rogene Houston, MD;  [facility];  Service: Endoscopy;  Laterality: N/A;  930-moved to 9:45 per Lelon Frohlich  . ESOPHAGOGASTRODUODENOSCOPY (EGD) WITH PROPOFOL N/A 03/11/2019   Procedure: ESOPHAGOGASTRODUODENOSCOPY (EGD) WITH PROPOFOL;  Surgeon: Rush Landmark Telford Nab., MD;  Location: [facility] END[facility]COPY;  Service: Gastroenterology;  Laterality: N/A;  . EUS N/A 03/11/2019   Procedure: UPPER ENDOSCOPIC ULTRASOUND (EUS) RADIAL;  Surgeon: Rush Landmark Telford Nab., MD;  Location: [place];  Service: Gastroenterology;  Laterality: N/A;  . FINE NEEDLE ASPIRATION  03/11/2019   Procedure: FINE NEEDLE ASPIRATION (FNA) LINEAR;  Surgeon: [name]., MD;  Location: [place];  Service: Gastroenterology;;  . POLYPECTOMY  01/19/2017   Procedure: POLYPECTOMY;  Surgeon: Rogene Houston, MD;  [facility];  Service: Endoscopy;;  splenic flexure  . POLYPECTOMY  03/11/2019   Procedure: POLYPECTOMY;  Surgeon: Mansouraty, Telford Nab., MD;  Location: [facility];  Service: Gastroenterology;;  . Sol Passer PLACEMENT Left 04/15/2019   Procedure: INSERTION PORT-A-CATH;  Surgeon: Aviva Signs, MD;  Location: [facility];  Service: General;  Laterality: Left;  . Right eye surgery     as a child-removed muscle from leg and put in upper eye lid     SOCIAL HISTORY:  Social History   Socioeconomic History  . Marital status: Widowed    Spouse name: Not on file  . Number of children: Not on file  . Years of education: Not on file  . Highest education level: Not on file  Occupational History    Employer: [employer]  Tobacco Use  . Smoking status: Current Every Day Smoker    Packs/day: 0.50    Years:  45.00    Pack years: 22.50    Types: Cigarettes  . Smokeless tobacco: Never Used  Substance and Sexual Activity  . Alcohol use: Not Currently  . Drug use: No  . Sexual activity: Not Currently  Other Topics Concern  . Not on file  Social History Narrative  . Not on file   Social Determinants of Health   Financial Resource Strain: Low Risk   . Difficulty of Paying Living Expenses: Not hard at all  Food Insecurity: No Food Insecurity  . Worried About Charity fundraiser in [place] Last Year: Never true  . Ran Out of Food in [place] Last Year: Never true  Transportation Needs: No Transportation Needs  . Lack of Transportation (Medical): No  . Lack of Transportation (Non-Medical): No  Physical Activity: Inactive  . Days of Exercise per Week: 0 days  . Minutes of Exercise per Session: 0 min  Stress: Stress Concern Present  . Feeling of Stress : Rather much  Social Connections: Moderately Isolated  . Frequency of Communication with Friends and Family: Three times a week  . Frequency of Social Gatherings with  Friends and Family: Three times a week  . Attends Religious Services: Never  . Active Member of Clubs or Organizations: No  . Attends Archivist Meetings: Never  . Marital Status: Widowed  Intimate Partner Violence: Not At Risk  . Fear of Current or Ex-Partner: No  . Emotionally Abused: No  . Physically Abused: No  . Sexually Abused: No    FAMILY HISTORY:  Family History  Problem Relation Age of Onset  . Throat cancer Father 64       died in his 73s of stomach issues?  . Lung cancer Brother 43       smoker, non-small cell  . Ovarian cancer Sister 79       GI related death  . Heart attack Maternal Grandmother   . Brain cancer Maternal Aunt        dx. in her 74s  . Lung cancer Maternal Uncle        dx. in his 37s-60s, smoker  . Stomach cancer Maternal Aunt        dx. in her 105s  . Colon cancer Cousin        dx. >50 - maternal cousin  . Kidney cancer Cousin         dx. in his 2s - maternal cousin  . Prostate cancer Cousin        dx. in his 8s - maternal cousin  . Leukemia Nephew 34  . Heart disease Neg Hx   . Hypertension Neg Hx   . Stroke Neg Hx     CURRENT MEDICATIONS:  Outpatient Encounter Medications as of 04/23/2019  Medication Sig  . docusate sodium (COLACE) 100 MG capsule Take 100 mg by mouth daily.  . fluorouracil CALGB 75102 in sodium chloride 0.9 % 150 mL Inject 2,400 mg/m2 into [place] vein over 48 hr.  . FLUOROURACIL IV Inject into [place] vein every 21 ( twenty-one) days.  . IRINOTECAN HCL IV Inject into [place] vein every 14 (fourteen) days.  [name] LEUCOVORIN CALCIUM IV Inject into [place] vein every 14 (fourteen) days.  [name] lidocaine-prilocaine (EMLA) cream Apply a small amount to port a cath site and cover with plastic wrap one hour prior to chemotherapy appointments  . lisinopril (ZESTRIL) 20 MG tablet TAKE 1 TABLET BY MOUTH DAILY (Patient taking differently: Take 20 mg by mouth daily. )  . lovastatin (MEVACOR) 20 MG tablet TAKE 1 TABLET BY MOUTH AT BEDTIME (Patient taking differently: Take 20 mg by mouth at bedtime. )  . melatonin 5 MG TABS Take 5 mg by mouth at bedtime.  . Multiple Vitamin (MULTIVITAMIN) tablet Take 1 tablet by mouth daily.  . OXALIPLATIN IV Inject into [place] vein every 14 (fourteen) days.  [name] HYDROcodone-acetaminophen (NORCO/VICODIN) 5-325 MG tablet Take 1 tablet by mouth every 4 (four) hours as needed for moderate pain. (Patient not taking: Reported on 04/23/2019)  . loperamide (IMODIUM A-D) 2 MG tablet Take 2 at onset of diarrhea, then 1 tab after every watery bowel movement - DO NOT EXCEED 8 tablets in a 24 hour period (Patient not taking: Reported on 04/23/2019)  . prochlorperazine (COMPAZINE) 10 MG tablet Take 1 tablet (10 mg total) by mouth every 6 (six) hours as needed (Nausea or vomiting). (Patient not taking: Reported on 04/23/2019)   No facility-administered encounter medications on file as of 04/23/2019.    ALLERGIES:  No  Known Allergies   PHYSICAL EXAM:  ECOG Performance status: 1  Vitals:   04/23/19 0824  BP: 130/86  Pulse: (!) 52  Resp: 18  Temp: (!) 97.1 F (36.2 C)  SpO2: 100%   Filed Weights   04/23/19 0824  Weight: 166 lb 9.6 oz (75.6 kg)    Physical Exam Vitals reviewed.  Constitutional:      Appearance: Normal appearance.  Cardiovascular:     Rate and Rhythm: Normal rate and regular rhythm.     Heart sounds: Normal heart sounds.  Pulmonary:     Effort: Pulmonary effort is normal.     Breath sounds: Normal breath sounds.  Abdominal:     Palpations: Abdomen is soft. There is no mass.  Skin:    General: Skin is warm.  Neurological:     General: No focal deficit present.     Mental Status: He is alert and oriented to person, place, and time.  Psychiatric:        Mood and Affect: Mood normal.        Behavior: Behavior normal.      LABORATORY DATA:  I have reviewed [place] labs as listed.  CBC    Component Value Date/Time   WBC 7.1 04/23/2019 0814   RBC 4.02 (L) 04/23/2019 0814   HGB 13.2 04/23/2019 0814   HGB 14.7 08/10/2018 0927   HCT 40.7 04/23/2019 0814   HCT 43.9 08/10/2018 0927   PLT 182 04/23/2019 0814   PLT 252 08/10/2018 0927   MCV 101.2 (H) 04/23/2019 0814   MCV 101 (H) 08/10/2018 0927   MCH 32.8 04/23/2019 0814   MCHC 32.4 04/23/2019 0814   RDW 13.0 04/23/2019 0814   RDW 12.0 08/10/2018 0927   LYMPHSABS 3.4 04/23/2019 0814   LYMPHSABS 4.5 (H) 08/10/2018 0927   MONOABS 0.6 04/23/2019 0814   EOSABS 0.3 04/23/2019 0814   EOSABS 0.1 08/10/2018 0927   BASOSABS 0.0 04/23/2019 0814   BASOSABS 0.1 08/10/2018 0927   CMP Latest Ref Rng & Units 04/23/2019 03/23/2019 03/12/2019  Glucose 70 - 99 mg/dL 180(H) 116(H) -  BUN 8 - 23 mg/dL 20 26(H) -  Creatinine 0.61 - 1.24 mg/dL 0.68 1.31(H) 0.80  Sodium 135 - 145 mmol/L 136 138 -  Potassium 3.5 - 5.1 mmol/L 4.1 4.4 -  Chloride 98 - 111 mmol/L 104 102 -  CO2 22 - 32 mmol/L 25 25 -  Calcium 8.9 - 10.3 mg/dL 8.7(L) 9.2  -  Total Protein 6.5 - 8.1 g/dL 7.3 7.9 -  Total Bilirubin 0.3 - 1.2 mg/dL 0.7 1.0 -  Alkaline Phos 38 - 126 U/L 59 55 -  AST 15 - 41 U/L 39 37 -  ALT 0 - 44 U/L 40 38 -       DIAGNOSTIC IMAGING:  I have reviewed scans.     ASSESSMENT & PLAN:   Pancreatic cancer ([place]) 1.  Pancreatic adenocarcinoma (UT4 N1 MX): -EGD/EUS on 03/11/2019 with mass in [place] pancreatic head.  Few malignant appearing lymph nodes were visualized in [place] porta hepatic region.  Largest measured 26 x 23 mm and was biopsied. -Pathology consistent with adenocarcinoma of [place] biopsy of [place] pancreatic head mass as well as lymph node. -PET scan on 03/28/2019 did not show any metastatic disease.  CA 19-9 was 40 on 02/13/2019. -CT AP with contrast on 03/12/2019 shows pancreatic duct dilatation in [place] body and tail.  Hypodense lesion measuring 1.4 x 1.3 1.3 cm, difficult to define on CT.  Lesion appears to contact [place] ventral surface of [place] proximal portal vein at [place] junction of [place] 70/15 millimeters segment.  SMA appears clear of [place] lesion. -He was evaluated by Dr. Barry Dienes on 04/22/2019.  She has recommended CT [facility] pancreatic protocol upon completion of neoadjuvant chemotherapy.  We will also do germline mutation testing, MSI testing and next-generation sequencing. -I have recommended neoadjuvant FOLFIRINOX.  We discussed about [place] side effects in detail.  Because of his mild cirrhosis, I will start him at 20% dose reduction.  If he tolerates well, we will increase it to regular dose. -I have reviewed his labs which are grossly within normal limits.  He will proceed with cycle 1 today.  He will also receive growth factor support. -I will reevaluate him in 1 week.  2.  Liver disease: -Recent CT scan showed mild degree of cirrhosis.  He has a history of hep C which was untreated.  LFTs including albumin and bilirubin are normal.  3.  Epigastric pain: -He has throbbing pain in [place] epigastric region for which he takes hydrocodone  5/325 every 4 hours as needed.  He will continue stool softener as needed.      Orders placed this encounter:  No orders of [place] defined types were placed in this encounter.    Du[place] Jack, MD [place] [phone]

## 2019-04-23 NOTE — Progress Notes (Signed)
Patient has been assessed, vital signs and labs have been reviewed by Dr. Katragadda. ANC, Creatinine, LFTs, and Platelets are within treatment parameters per Dr. Katragadda. The patient is good to proceed with treatment at this time.  

## 2019-04-24 ENCOUNTER — Telehealth: Payer: Self-pay

## 2019-04-24 NOTE — Telephone Encounter (Signed)
Let him know I have been getting copied on his oncology notes.    I refilled medications today per pharmacy request.

## 2019-04-24 NOTE — Telephone Encounter (Signed)
Done. Patient informed via mychart

## 2019-04-24 NOTE — Telephone Encounter (Signed)
Pt. Called stating that he needs a refill on his Lisinopril and Lovastatin to the [facility] in [place] pt. Last apt was 02/18/19.

## 2019-04-25 ENCOUNTER — Encounter: Payer: Self-pay | Admitting: Genetic Counselor

## 2019-04-25 ENCOUNTER — Ambulatory Visit: Payer: Self-pay | Admitting: Genetic Counselor

## 2019-04-25 ENCOUNTER — Telehealth: Payer: Self-pay | Admitting: Genetic Counselor

## 2019-04-25 ENCOUNTER — Other Ambulatory Visit: Payer: Self-pay

## 2019-04-25 ENCOUNTER — Encounter (HOSPITAL_COMMUNITY): Payer: BC Managed Care – PPO

## 2019-04-25 ENCOUNTER — Inpatient Hospital Stay (HOSPITAL_COMMUNITY): Payer: BC Managed Care – PPO

## 2019-04-25 VITALS — BP 158/77 | HR 48 | Temp 97.3°F | Resp 16

## 2019-04-25 DIAGNOSIS — F17210 Nicotine dependence, cigarettes, uncomplicated: Secondary | ICD-10-CM | POA: Diagnosis not present

## 2019-04-25 DIAGNOSIS — Z1379 Encounter for other screening for genetic and chromosomal anomalies: Secondary | ICD-10-CM

## 2019-04-25 DIAGNOSIS — Z95828 Presence of other vascular implants and grafts: Secondary | ICD-10-CM

## 2019-04-25 DIAGNOSIS — Z801 Family history of malignant neoplasm of trachea, bronchus and lung: Secondary | ICD-10-CM | POA: Diagnosis not present

## 2019-04-25 DIAGNOSIS — Z8051 Family history of malignant neoplasm of kidney: Secondary | ICD-10-CM | POA: Diagnosis not present

## 2019-04-25 DIAGNOSIS — Z800 Family history of malignant neoplasm of digestive organs: Secondary | ICD-10-CM | POA: Diagnosis not present

## 2019-04-25 DIAGNOSIS — K5900 Constipation, unspecified: Secondary | ICD-10-CM | POA: Diagnosis not present

## 2019-04-25 DIAGNOSIS — Z5189 Encounter for other specified aftercare: Secondary | ICD-10-CM | POA: Diagnosis not present

## 2019-04-25 DIAGNOSIS — K769 Liver disease, unspecified: Secondary | ICD-10-CM | POA: Diagnosis not present

## 2019-04-25 DIAGNOSIS — Z8041 Family history of malignant neoplasm of ovary: Secondary | ICD-10-CM | POA: Diagnosis not present

## 2019-04-25 DIAGNOSIS — Z806 Family history of leukemia: Secondary | ICD-10-CM | POA: Diagnosis not present

## 2019-04-25 DIAGNOSIS — Z8042 Family history of malignant neoplasm of prostate: Secondary | ICD-10-CM | POA: Diagnosis not present

## 2019-04-25 DIAGNOSIS — C250 Malignant neoplasm of head of pancreas: Secondary | ICD-10-CM | POA: Diagnosis not present

## 2019-04-25 DIAGNOSIS — Z79899 Other long term (current) drug therapy: Secondary | ICD-10-CM | POA: Diagnosis not present

## 2019-04-25 DIAGNOSIS — R202 Paresthesia of skin: Secondary | ICD-10-CM | POA: Diagnosis not present

## 2019-04-25 DIAGNOSIS — Z5111 Encounter for antineoplastic chemotherapy: Secondary | ICD-10-CM | POA: Diagnosis not present

## 2019-04-25 DIAGNOSIS — Z808 Family history of malignant neoplasm of other organs or systems: Secondary | ICD-10-CM | POA: Diagnosis not present

## 2019-04-25 DIAGNOSIS — R1013 Epigastric pain: Secondary | ICD-10-CM | POA: Diagnosis not present

## 2019-04-25 MED ORDER — SODIUM CHLORIDE 0.9% FLUSH
10.0000 mL | INTRAVENOUS | Status: DC | PRN
  Administered 2019-04-25: 10 mL

## 2019-04-25 MED ORDER — HEPARIN SOD (PORK) LOCK FLUSH 100 UNIT/ML IV SOLN
500.0000 [IU] | Freq: Once | INTRAVENOUS | Status: AC | PRN
  Administered 2019-04-25: 500 [IU]

## 2019-04-25 MED ORDER — PEGFILGRASTIM-CBQV 6 MG/0.6ML ~~LOC~~ SOSY
6.0000 mg | PREFILLED_SYRINGE | Freq: Once | SUBCUTANEOUS | Status: AC
  Administered 2019-04-25: 6 mg via SUBCUTANEOUS
  Filled 2019-04-25: qty 0.6

## 2019-04-25 NOTE — Progress Notes (Signed)
Flossie Amato returns today for port de access and flush after 46 hr continous infusion of 74fu. Tolerated infusion without problems. Portacath located left chest wall was  deaccessed and flushed with 62ml NS and 500U/44ml Heparin and needle removed intact.  Procedure without incident. Patient tolerated procedure well.  Udenyca injection given per orders.   Treatment given per orders. Patient tolerated it well without problems. Vitals stable and discharged home from clinic ambulatory. Follow up as scheduled.

## 2019-04-25 NOTE — Telephone Encounter (Signed)
Revealed negative genetic testing.  Discussed that we do not know why he has pancreatic cancer or why there is cancer in the family.  There could be a genetic mutation in the family that Du[place] Alexander did not inherit.  There could also be a mutation in a different gene that we are not testing, or that our current technology may not be able detect certain mutations.  It will therefore be important for him to stay in contact with genetics to keep up with whether additional testing may be appropriate in the future.

## 2019-04-25 NOTE — Progress Notes (Signed)
HPI:  [name] was previously seen in the [place] clinic due to a personal and family history of cancer and concerns regarding a hereditary predisposition to cancer. Please refer to our prior cancer genetics clinic note for more information regarding our discussion, assessment and recommendations, at the time. [name]'s recent genetic test results were disclosed to him, as were recommendations warranted by these results. These results and recommendations are discussed in more detail below.  CANCER HISTORY:  Oncology History  Pancreatic cancer ([place])  03/28/2019 Initial Diagnosis   Pancreatic cancer ([place])   03/28/2019 Cancer Staging   Staging form: Exocrine Pancreas, AJCC 8th Edition - Clinical stage from 03/28/2019: Stage III (cT4, cN1, cM0) - Signed by Derek Jack, MD on 03/28/2019   04/20/2019 Genetic Testing   Negative genetic testing:  No pathogenic variants detected on the [facility] Multi-Cancer panel. The report date is 04/20/2019.  The Multi-Cancer Panel offered by [facility] includes sequencing and/or deletion duplication testing of the following 85 genes: AIP, ALK, APC, ATM, AXIN2,BAP1,  BARD1, BLM, BMPR1A, BRCA1, BRCA2, BRIP1, CASR, CDC73, CDH1, CDK4, CDKN1B, CDKN1C, CDKN2A (p14ARF), CDKN2A (p16INK4a), CEBPA, CHEK2, CTNNA1, DICER1, DIS3L2, EGFR (c.2369C>T, p.Thr790Met variant only), EPCAM (Deletion/duplication testing only), FH, FLCN, GATA2, GPC3, GREM1 (Promoter region deletion/duplication testing only), HOXB13 (c.251G>A, p.Gly84Glu), HRAS, KIT, MAX, MEN1, MET, MITF (c.952G>A, p.Glu318Lys variant only), MLH1, MSH2, MSH3, MSH6, MUTYH, NBN, NF1, NF2, NTHL1, PALB2, PDGFRA, PHOX2B, PMS2, POLD1, POLE, POT1, PRKAR1A, PTCH1, PTEN, RAD50, RAD51C, RAD51D, RB1, RECQL4, RET, RNF43, RUNX1, SDHAF2, SDHA (sequence changes only), SDHB, SDHC, SDHD, SMAD4, SMARCA4, SMARCB1, SMARCE1, STK11, SUFU, TERC, TERT, TMEM127, TP53, TSC1, TSC2, VHL, WRN and WT1.   04/23/2019 -   Chemotherapy   The patient had palonosetron (ALOXI) injection 0.25 mg, 0.25 mg, Intravenous,  Once, 1 of 4 cycles Administration: 0.25 mg (04/23/2019) pegfilgrastim-cbqv (UDENYCA) injection 6 mg, 6 mg, Subcutaneous, Once, 1 of 4 cycles irinotecan (CAMPTOSAR) 240 mg in sodium chloride 0.9 % 500 mL chemo infusion, 120 mg/m2 = 240 mg (80 % of original dose 150 mg/m2), Intravenous,  Once, 1 of 4 cycles Dose modification: 120 mg/m2 (80 % of original dose 150 mg/m2, Cycle 1, Reason: Other (see comments), Comment: cirrhosis) Administration: 240 mg (04/23/2019) oxaliplatin (ELOXATIN) 130 mg in dextrose 5 % 500 mL chemo infusion, 68 mg/m2 = 130 mg (80 % of original dose 85 mg/m2), Intravenous,  Once, 1 of 4 cycles Dose modification: 68 mg/m2 (80 % of original dose 85 mg/m2, Cycle 1, Reason: Other (see comments), Comment: cirrhosis) Administration: 130 mg (04/23/2019) fosaprepitant (EMEND) 150 mg in sodium chloride 0.9 % 145 mL IVPB, 150 mg, Intravenous,  Once, 1 of 4 cycles Administration: 150 mg (04/23/2019) fluorouracil (ADRUCIL) 3,700 mg in sodium chloride 0.9 % 76 mL chemo infusion, 1,920 mg/m2 = 3,700 mg (80 % of original dose 2,400 mg/m2), Intravenous, 1 Day/Dose, 1 of 4 cycles Dose modification: 1,920 mg/m2 (80 % of original dose 2,400 mg/m2, Cycle 1, Reason: Other (see comments), Comment: cirrhosis) Administration: 3,700 mg (04/23/2019) leucovorin 618 mg in sodium chloride 0.9 % 250 mL infusion, 320 mg/m2 = 618 mg (80 % of original dose 400 mg/m2), Intravenous,  Once, 1 of 4 cycles Dose modification: 320 mg/m2 (80 % of original dose 400 mg/m2, Cycle 1, Reason: Other (see comments), Comment: cirrhosis) Administration: 618 mg (04/23/2019)  for chemotherapy treatment.      FAMILY HISTORY:  We obtained a detailed, 4-generation family history.  Significant diagnoses are listed below: Family History  Problem Relation Age of  Onset  . Throat cancer Father 37       died in his 81s of stomach issues?  .  Lung cancer Brother 57       smoker, non-small cell  . Ovarian cancer Sister 41       GI related death  . Heart attack Maternal Grandmother   . Brain cancer Maternal Aunt        dx. in her 7s  . Lung cancer Maternal Uncle        dx. in his 42s-60s, smoker  . Stomach cancer Maternal Aunt        dx. in her 35s  . Colon cancer Cousin        dx. >50 - maternal cousin  . Kidney cancer Cousin        dx. in his 64s - maternal cousin  . Prostate cancer Cousin        dx. in his 37s - maternal cousin  . Leukemia Nephew 34  . Heart disease Neg Hx   . Hypertension Neg Hx   . Stroke Neg Hx    [name] has no children. He has three sisters and one brother. One sister died at the age of 61 from ovarian cancer that was diagnosed at 29. One brother died at the age of 39 from non-small cell lung cancer that had been diagnosed at age 4. This brother was a smoker. He also has a nephew who died from leukemia at the age of 14.   [name]'s mother died at the age of 92 and did not have cancer. He had three maternal aunts and four or five maternal uncles. One aunt died from brain cancer in her 67s. Another aunt was diagnosed with stomach cancer in her 37s and later died from being shot. One uncle died in his 25s or 6s from lung cancer and was a smoker. [name] has three maternal cousins who had cancer - one had colon cancer diagnosed when he was older than 26, a second had kidney cancer diagnosed in his 29s, and a third had prostate cancer diagnosed at the age of 60. [name]'s maternal grandmother died in her 18s from a heart attack, and he is unsure how old his maternal grandfather was when he died. There are no other known diagnoses of cancer on the maternal side of the family.  [name]'s father died at the age of 45 from throat cancer and was a smoker. [name]'s father had a twin brother who has not had cancer to his knowledge. He does not know how old his paternal  grandparents were when they died, or if there are others on the paternal side of the family who have had cancer.  [name] is unaware of previous family history of genetic testing for hereditary cancer risks. Patient's maternal ancestors are of [place] and European descent, and paternal ancestors are of [place] descent. There is no reported Ashkenazi Jewish ancestry. There is no known consanguinity.  GENETIC TEST RESULTS: Genetic testing reported out on 04/20/2019 through the [facility] Multi-Cancer panel. No pathogenic variants were detected.   The Multi-Cancer Panel offered by [facility] includes sequencing and/or deletion duplication testing of the following 85 genes: AIP, ALK, APC, ATM, AXIN2,BAP1,  BARD1, BLM, BMPR1A, BRCA1, BRCA2, BRIP1, CASR, CDC73, CDH1, CDK4, CDKN1B, CDKN1C, CDKN2A (p14ARF), CDKN2A (p16INK4a), CEBPA, CHEK2, CTNNA1, DICER1, DIS3L2, EGFR (c.2369C>T, p.Thr790Met variant only), EPCAM (Deletion/duplication testing only), FH, FLCN, GATA2, GPC3, GREM1 (Promoter region deletion/duplication testing only), HOXB13 (c.251G>A,  p.Gly84Glu), HRAS, KIT, MAX, MEN1, MET, MITF (c.952G>A, p.Glu318Lys variant only), MLH1, MSH2, MSH3, MSH6, MUTYH, NBN, NF1, NF2, NTHL1, PALB2, PDGFRA, PHOX2B, PMS2, POLD1, POLE, POT1, PRKAR1A, PTCH1, PTEN, RAD50, RAD51C, RAD51D, RB1, RECQL4, RET, RNF43, RUNX1, SDHAF2, SDHA (sequence changes only), SDHB, SDHC, SDHD, SMAD4, SMARCA4, SMARCB1, SMARCE1, STK11, SUFU, TERC, TERT, TMEM127, TP53, TSC1, TSC2, VHL, WRN and WT1. The test report will be scanned into EPIC and located under the Molecular Pathology section of the Results Review tab.  A portion of the result report is included below for reference.     We discussed with [name] that because current genetic testing is not perfect, it is possible there may be a gene mutation in one of these genes that current testing cannot detect, but that chance is small.  We also discussed, that there could be another gene  that has not yet been discovered, or that we have not yet tested, that is responsible for the cancer diagnoses in the family. It is also possible there is a hereditary cause for the cancer in the family that [name] did not inherit and therefore was not identified in his testing.  Therefore, it is important to remain in touch with cancer genetics in the future so that we can continue to offer [name] the most up to date genetic testing.   CANCER SCREENING RECOMMENDATIONS: [name]'s test result is considered negative (normal).  This means that we have not identified a hereditary cause for his personal and family history of cancer at this time. Most cancers happen by chance and this negative test suggests that his personal of cancer may fall into this category.    While reassuring, this does not definitively rule out a hereditary predisposition to cancer. It is still possible that there could be genetic mutations that are undetectable by current technology. There could be genetic mutations in genes that have not been tested or identified to increase cancer risk.  Therefore, it is recommended he continue to follow the cancer management and screening guidelines provided by his oncology and primary healthcare providers.   An individual's cancer risk and medical management are not determined by genetic test results alone. Overall cancer risk assessment incorporates additional factors, including personal medical history, family history, and any available genetic information that may result in a personalized plan for cancer prevention and surveillance.  RECOMMENDATIONS FOR FAMILY MEMBERS:  Individuals in this family might be at some increased risk of developing cancer, over the general population risk, simply due to the family history of cancer.  We recommended women in this family have a yearly mammogram beginning at age 46, or 19 years younger than the earliest onset of cancer, an annual clinical  breast exam, and perform monthly breast self-exams. Women in this family should also have a gynecological exam as recommended by their primary provider. All family members should have a colonoscopy by age 50.  It is also possible there is a hereditary cause for the cancer in [name]'s family that he did not inherit and therefore was not identified in him.  Based on [name]'s family history, we recommended first degree relatives of his sister, who was diagnosed with ovarian cancer at age 20, have genetic counseling and testing. [name] will let us know if we can be of any assistance in coordinating genetic counseling and/or testing for this family member.   FOLLOW-UP: Lastly, we discussed with [name] that cancer genetics is a rapidly advancing field and it is possible  that new genetic tests will be appropriate for him and/or his family members in the future. We encouraged him to remain in contact with cancer genetics on an annual basis so we can update his personal and family histories and let him know of advances in cancer genetics that may benefit this family.   Our contact number was provided. [name]'s questions were answered to his satisfaction, and he knows he is welcome to call us at anytime with additional questions or concerns.   Clint Guy, MS, [facility] Genetic Counselor [email or web address] Phone: [phone]

## 2019-04-25 NOTE — Patient Instructions (Signed)
[facility]  Discharge Instructions:   _______________________________________________________________  Thank you for choosing [facility] at [facility] to provide your oncology and hematology care.  To afford each patient quality time with our providers, please arrive at least 15 minutes before your scheduled appointment.  You need to re-schedule your appointment if you arrive 10 or more minutes late.  We strive to give you quality time with our providers, and arriving late affects you and other patients whose appointments are after yours.  Also, if you no show three or more times for appointments you may be dismissed from the clinic.  Again, thank you for choosing [facility] at [facility]. Our hope is that these requests will allow you access to exceptional care and in a timely manner. _______________________________________________________________  If you have questions after your visit, please contact our office at [phone] between the hours of 8:30 a.m. and 5:00 p.m. Voicemails left after 4:30 p.m. will not be returned until the following business day. _______________________________________________________________  For prescription refill requests, have your pharmacy contact our office. _______________________________________________________________  Recommendations made by the consultant and any test results will be sent to your referring physician. _______________________________________________________________ 

## 2019-04-26 ENCOUNTER — Telehealth (HOSPITAL_COMMUNITY): Payer: Self-pay

## 2019-04-26 NOTE — Telephone Encounter (Signed)
24 hour follow up-left message for patient to call if he has any questions or concerns.

## 2019-04-29 ENCOUNTER — Inpatient Hospital Stay (HOSPITAL_COMMUNITY): Payer: BC Managed Care – PPO

## 2019-04-29 ENCOUNTER — Encounter (HOSPITAL_COMMUNITY): Payer: Self-pay | Admitting: Hematology

## 2019-04-29 ENCOUNTER — Inpatient Hospital Stay (HOSPITAL_BASED_OUTPATIENT_CLINIC_OR_DEPARTMENT_OTHER): Payer: BC Managed Care – PPO | Admitting: Hematology

## 2019-04-29 ENCOUNTER — Ambulatory Visit (HOSPITAL_COMMUNITY): Payer: BC Managed Care – PPO | Admitting: Hematology

## 2019-04-29 ENCOUNTER — Other Ambulatory Visit (HOSPITAL_COMMUNITY): Payer: BC Managed Care – PPO

## 2019-04-29 ENCOUNTER — Ambulatory Visit (HOSPITAL_COMMUNITY): Payer: BC Managed Care – PPO

## 2019-04-29 ENCOUNTER — Other Ambulatory Visit: Payer: Self-pay

## 2019-04-29 DIAGNOSIS — Z8041 Family history of malignant neoplasm of ovary: Secondary | ICD-10-CM | POA: Diagnosis not present

## 2019-04-29 DIAGNOSIS — C250 Malignant neoplasm of head of pancreas: Secondary | ICD-10-CM

## 2019-04-29 DIAGNOSIS — Z95828 Presence of other vascular implants and grafts: Secondary | ICD-10-CM

## 2019-04-29 DIAGNOSIS — K769 Liver disease, unspecified: Secondary | ICD-10-CM | POA: Diagnosis not present

## 2019-04-29 DIAGNOSIS — Z800 Family history of malignant neoplasm of digestive organs: Secondary | ICD-10-CM | POA: Diagnosis not present

## 2019-04-29 DIAGNOSIS — Z801 Family history of malignant neoplasm of trachea, bronchus and lung: Secondary | ICD-10-CM | POA: Diagnosis not present

## 2019-04-29 DIAGNOSIS — Z8051 Family history of malignant neoplasm of kidney: Secondary | ICD-10-CM | POA: Diagnosis not present

## 2019-04-29 DIAGNOSIS — Z79899 Other long term (current) drug therapy: Secondary | ICD-10-CM | POA: Diagnosis not present

## 2019-04-29 DIAGNOSIS — Z5189 Encounter for other specified aftercare: Secondary | ICD-10-CM | POA: Diagnosis not present

## 2019-04-29 DIAGNOSIS — Z808 Family history of malignant neoplasm of other organs or systems: Secondary | ICD-10-CM | POA: Diagnosis not present

## 2019-04-29 DIAGNOSIS — K5900 Constipation, unspecified: Secondary | ICD-10-CM | POA: Diagnosis not present

## 2019-04-29 DIAGNOSIS — R1013 Epigastric pain: Secondary | ICD-10-CM | POA: Diagnosis not present

## 2019-04-29 DIAGNOSIS — R202 Paresthesia of skin: Secondary | ICD-10-CM | POA: Diagnosis not present

## 2019-04-29 DIAGNOSIS — Z806 Family history of leukemia: Secondary | ICD-10-CM | POA: Diagnosis not present

## 2019-04-29 DIAGNOSIS — F17210 Nicotine dependence, cigarettes, uncomplicated: Secondary | ICD-10-CM | POA: Diagnosis not present

## 2019-04-29 DIAGNOSIS — Z5111 Encounter for antineoplastic chemotherapy: Secondary | ICD-10-CM | POA: Diagnosis not present

## 2019-04-29 DIAGNOSIS — Z8042 Family history of malignant neoplasm of prostate: Secondary | ICD-10-CM | POA: Diagnosis not present

## 2019-04-29 LAB — CBC WITH DIFFERENTIAL/PLATELET
Abs Immature Granulocytes: 0.12 10*3/uL — ABNORMAL HIGH (ref 0.00–0.07)
Band Neutrophils: 7 %
Basophils Absolute: 0 10*3/uL (ref 0.0–0.1)
Basophils Relative: 0 %
Eosinophils Absolute: 0.2 10*3/uL (ref 0.0–0.5)
Eosinophils Relative: 1 %
HCT: 42.1 % (ref 39.0–52.0)
Hemoglobin: 13.7 g/dL (ref 13.0–17.0)
Lymphocytes Relative: 29 %
Lymphs Abs: 5.9 10*3/uL — ABNORMAL HIGH (ref 0.7–4.0)
MCH: 33 pg (ref 26.0–34.0)
MCHC: 32.5 g/dL (ref 30.0–36.0)
MCV: 101.4 fL — ABNORMAL HIGH (ref 80.0–100.0)
Monocytes Absolute: 0.6 10*3/uL (ref 0.1–1.0)
Monocytes Relative: 3 %
Neutro Abs: 13.5 10*3/uL — ABNORMAL HIGH (ref 1.7–7.7)
Neutrophils Relative %: 60 %
Platelets: 144 10*3/uL — ABNORMAL LOW (ref 150–400)
RBC: 4.15 MIL/uL — ABNORMAL LOW (ref 4.22–5.81)
RDW: 13 % (ref 11.5–15.5)
WBC: 20.2 10*3/uL — ABNORMAL HIGH (ref 4.0–10.5)
nRBC: 0 % (ref 0.0–0.2)

## 2019-04-29 LAB — COMPREHENSIVE METABOLIC PANEL
ALT: 47 U/L — ABNORMAL HIGH (ref 0–44)
AST: 34 U/L (ref 15–41)
Albumin: 3.7 g/dL (ref 3.5–5.0)
Alkaline Phosphatase: 87 U/L (ref 38–126)
Anion gap: 9 (ref 5–15)
BUN: 25 mg/dL — ABNORMAL HIGH (ref 8–23)
CO2: 24 mmol/L (ref 22–32)
Calcium: 9.3 mg/dL (ref 8.9–10.3)
Chloride: 102 mmol/L (ref 98–111)
Creatinine, Ser: 0.79 mg/dL (ref 0.61–1.24)
GFR calc Af Amer: >60 mL/min (ref >60)
GFR calc non Af Amer: >60 mL/min (ref >60)
Glucose, Bld: 124 mg/dL — ABNORMAL HIGH (ref 70–99)
Potassium: 4.8 mmol/L (ref 3.5–5.1)
Sodium: 135 mmol/L (ref 135–145)
Total Bilirubin: 0.7 mg/dL (ref 0.3–1.2)
Total Protein: 7.6 g/dL (ref 6.5–8.1)

## 2019-04-29 MED ORDER — HEPARIN SOD (PORK) LOCK FLUSH 100 UNIT/ML IV SOLN
500.0000 [IU] | Freq: Once | INTRAVENOUS | Status: AC
  Administered 2019-04-29: 500 [IU] via INTRAVENOUS

## 2019-04-29 MED ORDER — SODIUM CHLORIDE 0.9% FLUSH
10.0000 mL | Freq: Once | INTRAVENOUS | Status: AC
  Administered 2019-04-29: 10 mL via INTRAVENOUS

## 2019-04-29 NOTE — Progress Notes (Signed)
Labs drawn from port.  Patients port flushed without difficulty.  Good blood return noted with no bruising or swelling noted at site.  Band aid applied.  VSS with discharge and left ambulatory with no s/s of distress noted.  

## 2019-04-29 NOTE — Progress Notes (Signed)
[place], [place] [address]   CLINIC:  Medical Oncology/Hematology  PCP:  [name]  [id number] [phone]   REASON FOR VISIT:  Follow-up for pancreatic adenocarcinoma.  CURRENT THERAPY: Neoadjuvant FOLFIRINOX.  BRIEF ONCOLOGIC HISTORY:  Oncology History  Pancreatic cancer ([place])  03/28/2019 Initial Diagnosis   Pancreatic cancer ([place])   03/28/2019 Cancer Staging   Staging form: Exocrine Pancreas, AJCC 8th Edition - Clinical stage from 03/28/2019: Stage III (cT4, cN1, cM0) - Signed by Derek Jack, MD on 03/28/2019   04/20/2019 Genetic Testing   Negative genetic testing:  No pathogenic variants detected on the [facility] Multi-Cancer panel. The report date is 04/20/2019.  The Multi-Cancer Panel offered by [facility] includes sequencing and/or deletion duplication testing of the following 85 genes: AIP, ALK, APC, ATM, AXIN2,BAP1,  BARD1, BLM, BMPR1A, BRCA1, BRCA2, BRIP1, CASR, CDC73, CDH1, CDK4, CDKN1B, CDKN1C, CDKN2A (p14ARF), CDKN2A (p16INK4a), CEBPA, CHEK2, CTNNA1, DICER1, DIS3L2, EGFR (c.2369C>T, p.Thr790Met variant only), EPCAM (Deletion/duplication testing only), FH, FLCN, GATA2, GPC3, GREM1 (Promoter region deletion/duplication testing only), HOXB13 (c.251G>A, p.Gly84Glu), HRAS, KIT, MAX, MEN1, MET, MITF (c.952G>A, p.Glu318Lys variant only), MLH1, MSH2, MSH3, MSH6, MUTYH, NBN, NF1, NF2, NTHL1, PALB2, PDGFRA, PHOX2B, PMS2, POLD1, POLE, POT1, PRKAR1A, PTCH1, PTEN, RAD50, RAD51C, RAD51D, RB1, RECQL4, RET, RNF43, RUNX1, SDHAF2, SDHA (sequence changes only), SDHB, SDHC, SDHD, SMAD4, SMARCA4, SMARCB1, SMARCE1, STK11, SUFU, TERC, TERT, TMEM127, TP53, TSC1, TSC2, VHL, WRN and WT1.   04/23/2019 -  Chemotherapy   The patient had palonosetron (ALOXI) injection 0.25 mg, 0.25 mg, Intravenous,  Once, 1 of 4 cycles Administration: 0.25 mg (04/23/2019) pegfilgrastim-cbqv (UDENYCA) injection 6 mg, 6 mg, Subcutaneous,  Once, 1 of 4 cycles Administration: 6 mg (04/25/2019) irinotecan (CAMPTOSAR) 240 mg in sodium chloride 0.9 % 500 mL chemo infusion, 120 mg/m2 = 240 mg (80 % of original dose 150 mg/m2), Intravenous,  Once, 1 of 4 cycles Dose modification: 120 mg/m2 (80 % of original dose 150 mg/m2, Cycle 1, Reason: Other (see comments), Comment: cirrhosis) Administration: 240 mg (04/23/2019) oxaliplatin (ELOXATIN) 130 mg in dextrose 5 % 500 mL chemo infusion, 68 mg/m2 = 130 mg (80 % of original dose 85 mg/m2), Intravenous,  Once, 1 of 4 cycles Dose modification: 68 mg/m2 (80 % of original dose 85 mg/m2, Cycle 1, Reason: Other (see comments), Comment: cirrhosis) Administration: 130 mg (04/23/2019) fosaprepitant (EMEND) 150 mg in sodium chloride 0.9 % 145 mL IVPB, 150 mg, Intravenous,  Once, 1 of 4 cycles Administration: 150 mg (04/23/2019) fluorouracil (ADRUCIL) 3,700 mg in sodium chloride 0.9 % 76 mL chemo infusion, 1,920 mg/m2 = 3,700 mg (80 % of original dose 2,400 mg/m2), Intravenous, 1 Day/Dose, 1 of 4 cycles Dose modification: 1,920 mg/m2 (80 % of original dose 2,400 mg/m2, Cycle 1, Reason: Other (see comments), Comment: cirrhosis) Administration: 3,700 mg (04/23/2019) leucovorin 618 mg in sodium chloride 0.9 % 250 mL infusion, 320 mg/m2 = 618 mg (80 % of original dose 400 mg/m2), Intravenous,  Once, 1 of 4 cycles Dose modification: 320 mg/m2 (80 % of original dose 400 mg/m2, Cycle 1, Reason: Other (see comments), Comment: cirrhosis) Administration: 618 mg (04/23/2019)  for chemotherapy treatment.       CANCER STAGING: Cancer Staging Pancreatic cancer [facility]) Staging form: Exocrine Pancreas, AJCC 8th Edition - Clinical stage from 03/28/2019: Stage III (cT4, cN1, cM0) - Signed by Derek Jack, MD on 03/28/2019    INTERVAL HISTORY:  Mr. Debrosse 64 y.o. male seen for follow-up of pancreatic adenocarcinoma.  Appetite is 100%.  Energy levels are 80%.  Reports epigastric pain.  She is taking hydrocodone  up to 6 tablets/day.  Reports some constipation.  Numbness in the fingers from back problem is also stable.  Denies any fevers or chills.  Reported bone pains lasted 2 days after Neulasta injection.    REVIEW OF SYSTEMS:  Review of Systems  Gastrointestinal: Positive for abdominal pain and constipation.  Neurological: Positive for numbness.  All other systems reviewed and are negative.    PAST MEDICAL/SURGICAL HISTORY:  Past Medical History:  Diagnosis Date  . Aortic atherosclerosis ([place])   . COPD (chronic obstructive pulmonary disease) ([place])    per CT findings 2019  . Family history of brain cancer   . Family history of colon cancer   . Family history of kidney cancer   . Family history of leukemia   . Family history of lung cancer   . Family history of ovarian cancer   . Family history of prostate cancer   . Family history of stomach cancer   . GERD (gastroesophageal reflux disease)   . Hepatitis C    referred to hepatitis clinic 2019 but he never went  . Hyperlipidemia   . Hypertension   . Port-A-Cath in place 04/17/2019  . Smoker   . Wears glasses    Past Surgical History:  Procedure Laterality Date  . ANTERIOR CERVICAL DECOMP/DISCECTOMY FUSION  12/2017   Dr. Consuella Lose  . BIOPSY  03/11/2019   Procedure: BIOPSY;  Surgeon: [name]., MD;  Location: [place];  Service: Gastroenterology;;  . COLONOSCOPY N/A 01/19/2017   Procedure: COLONOSCOPY;  Surgeon: Rogene Houston, MD;  [facility];  Service: Endoscopy;  Laterality: N/A;  930-moved to 9:45 per Lelon Frohlich  . ESOPHAGOGASTRODUODENOSCOPY (EGD) WITH PROPOFOL N/A 03/11/2019   Procedure: ESOPHAGOGASTRODUODENOSCOPY (EGD) WITH PROPOFOL;  Surgeon: Rush Landmark Telford Nab., MD;  Location: [facility] ENDOSCOPY;  Service: Gastroenterology;  Laterality: N/A;  . EUS N/A 03/11/2019   Procedure: UPPER ENDOSCOPIC ULTRASOUND (EUS) RADIAL;  Surgeon: Rush Landmark Telford Nab., MD;  Location: [place];  Service:  Gastroenterology;  Laterality: N/A;  . FINE NEEDLE ASPIRATION  03/11/2019   Procedure: FINE NEEDLE ASPIRATION (FNA) LINEAR;  Surgeon: [name]., MD;  Location: [place];  Service: Gastroenterology;;  . POLYPECTOMY  01/19/2017   Procedure: POLYPECTOMY;  Surgeon: Rogene Houston, MD;  [facility];  Service: Endoscopy;;  splenic flexure  . POLYPECTOMY  03/11/2019   Procedure: POLYPECTOMY;  Surgeon: Mansouraty, Telford Nab., MD;  Location: [place];  Service: Gastroenterology;;  . Sol Passer PLACEMENT Left 04/15/2019   Procedure: INSERTION PORT-A-CATH;  Surgeon: Aviva Signs, MD;  Location: [facility];  Service: General;  Laterality: Left;  . Right eye surgery     as a child-removed muscle from leg and put in upper eye lid     SOCIAL HISTORY:  Social History   Socioeconomic History  . Marital status: Widowed    Spouse name: Not on file  . Number of children: Not on file  . Years of education: Not on file  . Highest education level: Not on file  Occupational History    Employer: [employer]  Tobacco Use  . Smoking status: Current Every Day Smoker    Packs/day: 0.50    Years: 45.00    Pack years: 22.50    Types: Cigarettes  . Smokeless tobacco: Never Used  Substance and Sexual Activity  . Alcohol use: Not Currently  . Drug use:  No  . Sexual activity: Not Currently  Other Topics Concern  . Not on file  Social History Narrative  . Not on file   Social Determinants of Health   Financial Resource Strain: Low Risk   . Difficulty of Paying Living Expenses: Not hard at all  Food Insecurity: No Food Insecurity  . Worried About Charity fundraiser in the Last Year: Never true  . Ran Out of Food in the Last Year: Never true  Transportation Needs: No Transportation Needs  . Lack of Transportation (Medical): No  . Lack of Transportation (Non-Medical): No  Physical Activity: Inactive  . Days of Exercise per Week: 0 days  . Minutes of Exercise per  Session: 0 min  Stress: Stress Concern Present  . Feeling of Stress : Rather much  Social Connections: Moderately Isolated  . Frequency of Communication with Friends and Family: Three times a week  . Frequency of Social Gatherings with Friends and Family: Three times a week  . Attends Religious Services: Never  . Active Member of Clubs or Organizations: No  . Attends Archivist Meetings: Never  . Marital Status: Widowed  Intimate Partner Violence: Not At Risk  . Fear of Current or Ex-Partner: No  . Emotionally Abused: No  . Physically Abused: No  . Sexually Abused: No    FAMILY HISTORY:  Family History  Problem Relation Age of Onset  . Throat cancer Father 39       died in his 19s of stomach issues?  . Lung cancer Brother 63       smoker, non-small cell  . Ovarian cancer Sister 53       GI related death  . Heart attack Maternal Grandmother   . Brain cancer Maternal Aunt        dx. in her 14s  . Lung cancer Maternal Uncle        dx. in his 21s-60s, smoker  . Stomach cancer Maternal Aunt        dx. in her 55s  . Colon cancer Cousin        dx. >50 - maternal cousin  . Kidney cancer Cousin        dx. in his 47s - maternal cousin  . Prostate cancer Cousin        dx. in his 69s - maternal cousin  . Leukemia Nephew 34  . Heart disease Neg Hx   . Hypertension Neg Hx   . Stroke Neg Hx     CURRENT MEDICATIONS:  Outpatient Encounter Medications as of 04/29/2019  Medication Sig  . docusate sodium (COLACE) 100 MG capsule Take 100 mg by mouth daily.  . fluorouracil CALGB 52841 in sodium chloride 0.9 % 150 mL Inject 2,400 mg/m2 into the vein over 48 hr.  . FLUOROURACIL IV Inject into the vein every 21 ( twenty-one) days.  [name] HYDROcodone-acetaminophen (NORCO/VICODIN) 5-325 MG tablet Take 1 tablet by mouth every 4 (four) hours as needed for moderate pain.  . IRINOTECAN HCL IV Inject into the vein every 14 (fourteen) days.  [name] LEUCOVORIN CALCIUM IV Inject into the vein every  14 (fourteen) days.  [name] lisinopril (ZESTRIL) 20 MG tablet TAKE 1 TABLET BY MOUTH DAILY  . lovastatin (MEVACOR) 20 MG tablet TAKE 1 TABLET BY MOUTH AT BEDTIME  . melatonin 5 MG TABS Take 5 mg by mouth at bedtime.  . Multiple Vitamin (MULTIVITAMIN) tablet Take 1 tablet by mouth daily.  . OXALIPLATIN IV Inject into the vein  every 14 (fourteen) days.  [name] lidocaine-prilocaine (EMLA) cream Apply a small amount to port a cath site and cover with plastic wrap one hour prior to chemotherapy appointments (Patient not taking: Reported on 04/29/2019)  . loperamide (IMODIUM A-D) 2 MG tablet Take 2 at onset of diarrhea, then 1 tab after every watery bowel movement - DO NOT EXCEED 8 tablets in a 24 hour period (Patient not taking: Reported on 04/23/2019)  . prochlorperazine (COMPAZINE) 10 MG tablet Take 1 tablet (10 mg total) by mouth every 6 (six) hours as needed (Nausea or vomiting). (Patient not taking: Reported on 04/23/2019)   No facility-administered encounter medications on file as of 04/29/2019.    ALLERGIES:  No Known Allergies   PHYSICAL EXAM:  ECOG Performance status: 1  Vitals:   04/29/19 0958  BP: 108/70  Pulse: 68  Resp: 19  Temp: (!) 97.1 F (36.2 C)  SpO2: 100%   Filed Weights   04/29/19 0958  Weight: 159 lb 4.8 oz (72.3 kg)    Physical Exam Vitals reviewed.  Constitutional:      Appearance: Normal appearance.  Cardiovascular:     Rate and Rhythm: Normal rate and regular rhythm.     Heart sounds: Normal heart sounds.  Pulmonary:     Effort: Pulmonary effort is normal.     Breath sounds: Normal breath sounds.  Abdominal:     Palpations: Abdomen is soft. There is no mass.  Skin:    General: Skin is warm.  Neurological:     General: No focal deficit present.     Mental Status: He is alert and oriented to person, place, and time.  Psychiatric:        Mood and Affect: Mood normal.        Behavior: Behavior normal.      LABORATORY DATA:  I have reviewed the labs as  listed.  CBC    Component Value Date/Time   WBC 20.2 (H) 04/29/2019 0953   RBC 4.15 (L) 04/29/2019 0953   HGB 13.7 04/29/2019 0953   HGB 14.7 08/10/2018 0927   HCT 42.1 04/29/2019 0953   HCT 43.9 08/10/2018 0927   PLT 144 (L) 04/29/2019 0953   PLT 252 08/10/2018 0927   MCV 101.4 (H) 04/29/2019 0953   MCV 101 (H) 08/10/2018 0927   MCH 33.0 04/29/2019 0953   MCHC 32.5 04/29/2019 0953   RDW 13.0 04/29/2019 0953   RDW 12.0 08/10/2018 0927   LYMPHSABS 5.9 (H) 04/29/2019 0953   LYMPHSABS 4.5 (H) 08/10/2018 0927   MONOABS 0.6 04/29/2019 0953   EOSABS 0.2 04/29/2019 0953   EOSABS 0.1 08/10/2018 0927   BASOSABS 0.0 04/29/2019 0953   BASOSABS 0.1 08/10/2018 0927   CMP Latest Ref Rng & Units 04/29/2019 04/23/2019 03/23/2019  Glucose 70 - 99 mg/dL 124(H) 180(H) 116(H)  BUN 8 - 23 mg/dL 25(H) 20 26(H)  Creatinine 0.61 - 1.24 mg/dL 0.79 0.68 1.31(H)  Sodium 135 - 145 mmol/L 135 136 138  Potassium 3.5 - 5.1 mmol/L 4.8 4.1 4.4  Chloride 98 - 111 mmol/L 102 104 102  CO2 22 - 32 mmol/L _0 Calcium 8.9 - 10.3 mg/dL 9.3 8.7(L) 9.2  Total Protein 6.5 - 8.1 g/dL 7.6 7.3 7.9  Total Bilirubin 0.3 - 1.2 mg/dL 0.7 0.7 1.0  Alkaline Phos 38 - 126 U/L 87 59 55  AST 15 - 41 U/L 34 39 37  ALT 0 - 44 U/L 47(H) 40 38  DIAGNOSTIC IMAGING:  I have reviewed scans.     ASSESSMENT & PLAN:   Pancreatic cancer ([place]) 1.  Pancreatic adenocarcinoma (UT 4 N1 MX): -Germline mutation testing is negative. -PET scan on 03/28/2019 did not show any evidence of metastatic disease. -CA 19-9 was 40 on 02/13/2019. -Cycle 1 of FOLFIRINOX on 04/23/2019.  This was 20% dose reduced. -We reviewed his labs today.  ALT is elevated at 47.  Bilirubin is normal. -White count is 20.2 likely from G-CSF. -he will come back in 1 week for follow-up and cycle 2.  I plan to repeat CT pancreatic cancer protocol after 3 to 4 months of treatment.  He reported bone pains lasting 2 days after Neulasta injection.  I have  suggested him to take Claritin along with next cycle.  2.  Epigastric pain: -He has throbbing pain in the epigastric region. -He is taking hydrocodone 5/325 every 4 hours as needed.  3.  Liver disease: -CT scan showed mild degree of cirrhosis.  He has a history of hep C which was untreated. -ALT increased to 47 from 40 with first cycle.  We will closely monitor it.  4.  Tingling/numbness: -he reports chronic numbness in the fingertips from his cervical spine problem.  He also reports numbness of several years in the feet from lower back issues.  He does not report any worsening of it.  We will keep a close eye on it.      Orders placed this encounter:  No orders of the defined types were placed in this encounter.    Derek Jack, MD [place] [phone]

## 2019-04-29 NOTE — Patient Instructions (Addendum)
[facility] at [facility] Discharge Instructions  You were seen today by Dr. Katragadda. He went over your recent lab results. He will see you back in 1 week for labs, treatment and follow up.   Thank you for choosing [facility] at [facility] to provide your oncology and hematology care.  To afford each patient quality time with our provider, please arrive at least 15 minutes before your scheduled appointment time.   If you have a lab appointment with the Cancer Center please come in thru the  Main Entrance and check in at the main information desk  You need to re-schedule your appointment should you arrive 10 or more minutes late.  We strive to give you quality time with our providers, and arriving late affects you and other patients whose appointments are after yours.  Also, if you no show three or more times for appointments you may be dismissed from the clinic at the providers discretion.     Again, thank you for choosing [facility].  Our hope is that these requests will decrease the amount of time that you wait before being seen by our physicians.       _____________________________________________________________  Should you have questions after your visit to [facility], please contact our office at [phone] between the hours of 8:00 a.m. and 4:30 p.m.  Voicemails left after 4:00 p.m. will not be returned until the following business day.  For prescription refill requests, have your pharmacy contact our office and allow 72 hours.    Cancer Center Support Programs:   > Cancer Support Group  2nd Tuesday of the month 1pm-2pm, Journey Room    

## 2019-04-29 NOTE — Assessment & Plan Note (Addendum)
1.  Pancreatic adenocarcinoma (UT 4 N1 MX): -Germline mutation testing is negative. -PET scan on 03/28/2019 did not show any evidence of metastatic disease. -CA 19-9 was 40 on 02/13/2019. -Cycle 1 of FOLFIRINOX on 04/23/2019.  This was 20% dose reduced. -We reviewed his labs today.  ALT is elevated at 47.  Bilirubin is normal. -White count is 20.2 likely from G-CSF. -he will come back in 1 week for follow-up and cycle 2.  I plan to repeat CT pancreatic cancer protocol after 3 to 4 months of treatment.  He reported bone pains lasting 2 days after Neulasta injection.  I have suggested him to take Claritin along with next cycle.  2.  Epigastric pain: -He has throbbing pain in the epigastric region. -He is taking hydrocodone 5/325 every 4 hours as needed.  3.  Liver disease: -CT scan showed mild degree of cirrhosis.  He has a history of hep C which was untreated. -ALT increased to 47 from 40 with first cycle.  We will closely monitor it.  4.  Tingling/numbness: -he reports chronic numbness in the fingertips from his cervical spine problem.  He also reports numbness of several years in the feet from lower back issues.  He does not report any worsening of it.  We will keep a close eye on it.

## 2019-05-01 ENCOUNTER — Encounter (HOSPITAL_COMMUNITY): Payer: BC Managed Care – PPO

## 2019-05-03 ENCOUNTER — Encounter (HOSPITAL_COMMUNITY): Payer: Self-pay | Admitting: *Deleted

## 2019-05-03 ENCOUNTER — Other Ambulatory Visit (HOSPITAL_COMMUNITY): Payer: Self-pay | Admitting: *Deleted

## 2019-05-03 MED ORDER — HYDROCODONE-ACETAMINOPHEN 5-325 MG PO TABS: 1.0000 | ORAL_TABLET | ORAL | 0 refills | Status: DC | PRN

## 2019-05-06 ENCOUNTER — Other Ambulatory Visit (HOSPITAL_COMMUNITY): Payer: BC Managed Care – PPO

## 2019-05-06 ENCOUNTER — Ambulatory Visit (HOSPITAL_COMMUNITY): Payer: BC Managed Care – PPO | Admitting: Hematology

## 2019-05-07 ENCOUNTER — Inpatient Hospital Stay (HOSPITAL_BASED_OUTPATIENT_CLINIC_OR_DEPARTMENT_OTHER): Payer: BC Managed Care – PPO | Admitting: Hematology

## 2019-05-07 ENCOUNTER — Encounter (HOSPITAL_COMMUNITY): Payer: Self-pay | Admitting: Hematology

## 2019-05-07 ENCOUNTER — Other Ambulatory Visit: Payer: Self-pay

## 2019-05-07 ENCOUNTER — Inpatient Hospital Stay (HOSPITAL_COMMUNITY): Payer: BC Managed Care – PPO | Attending: Hematology

## 2019-05-07 ENCOUNTER — Inpatient Hospital Stay (HOSPITAL_COMMUNITY): Payer: BC Managed Care – PPO

## 2019-05-07 VITALS — BP 132/68 | HR 60 | Temp 97.1°F | Resp 18

## 2019-05-07 DIAGNOSIS — C250 Malignant neoplasm of head of pancreas: Secondary | ICD-10-CM | POA: Insufficient documentation

## 2019-05-07 DIAGNOSIS — Z5189 Encounter for other specified aftercare: Secondary | ICD-10-CM | POA: Diagnosis not present

## 2019-05-07 DIAGNOSIS — C259 Malignant neoplasm of pancreas, unspecified: Secondary | ICD-10-CM | POA: Diagnosis not present

## 2019-05-07 DIAGNOSIS — Z5111 Encounter for antineoplastic chemotherapy: Secondary | ICD-10-CM | POA: Diagnosis not present

## 2019-05-07 DIAGNOSIS — Z95828 Presence of other vascular implants and grafts: Secondary | ICD-10-CM

## 2019-05-07 LAB — CBC WITH DIFFERENTIAL/PLATELET
Abs Immature Granulocytes: 0.23 10*3/uL — ABNORMAL HIGH (ref 0.00–0.07)
Basophils Absolute: 0.1 10*3/uL (ref 0.0–0.1)
Basophils Relative: 1 %
Eosinophils Absolute: 0.5 10*3/uL (ref 0.0–0.5)
Eosinophils Relative: 3 %
HCT: 39 % (ref 39.0–52.0)
Hemoglobin: 12.8 g/dL — ABNORMAL LOW (ref 13.0–17.0)
Immature Granulocytes: 1 %
Lymphocytes Relative: 19 %
Lymphs Abs: 3.4 10*3/uL (ref 0.7–4.0)
MCH: 33.6 pg (ref 26.0–34.0)
MCHC: 32.8 g/dL (ref 30.0–36.0)
MCV: 102.4 fL — ABNORMAL HIGH (ref 80.0–100.0)
Monocytes Absolute: 0.8 10*3/uL (ref 0.1–1.0)
Monocytes Relative: 5 %
Neutro Abs: 13.2 10*3/uL — ABNORMAL HIGH (ref 1.7–7.7)
Neutrophils Relative %: 71 %
Platelets: 169 10*3/uL (ref 150–400)
RBC: 3.81 MIL/uL — ABNORMAL LOW (ref 4.22–5.81)
RDW: 13.5 % (ref 11.5–15.5)
WBC: 18.3 10*3/uL — ABNORMAL HIGH (ref 4.0–10.5)
nRBC: 0 % (ref 0.0–0.2)

## 2019-05-07 LAB — COMPREHENSIVE METABOLIC PANEL
ALT: 39 U/L (ref 0–44)
AST: 31 U/L (ref 15–41)
Albumin: 3.6 g/dL (ref 3.5–5.0)
Alkaline Phosphatase: 90 U/L (ref 38–126)
Anion gap: 9 (ref 5–15)
BUN: 23 mg/dL (ref 8–23)
CO2: 25 mmol/L (ref 22–32)
Calcium: 8.9 mg/dL (ref 8.9–10.3)
Chloride: 103 mmol/L (ref 98–111)
Creatinine, Ser: 0.76 mg/dL (ref 0.61–1.24)
GFR calc Af Amer: >60 mL/min (ref >60)
GFR calc non Af Amer: >60 mL/min (ref >60)
Glucose, Bld: 227 mg/dL — ABNORMAL HIGH (ref 70–99)
Potassium: 4.1 mmol/L (ref 3.5–5.1)
Sodium: 137 mmol/L (ref 135–145)
Total Bilirubin: 0.4 mg/dL (ref 0.3–1.2)
Total Protein: 6.9 g/dL (ref 6.5–8.1)

## 2019-05-07 MED ORDER — DEXTROSE 5 % IV SOLN: Freq: Once | INTRAVENOUS | Status: AC

## 2019-05-07 MED ORDER — SODIUM CHLORIDE 0.9 % IV SOLN
10.0000 mg | Freq: Once | INTRAVENOUS | Status: AC
  Administered 2019-05-07: 10 mg via INTRAVENOUS
  Filled 2019-05-07: qty 10

## 2019-05-07 MED ORDER — SODIUM CHLORIDE 0.9 % IV SOLN
400.0000 mg/m2 | Freq: Once | INTRAVENOUS | Status: AC
  Administered 2019-05-07: 13:00:00 772 mg via INTRAVENOUS
  Filled 2019-05-07: qty 5

## 2019-05-07 MED ORDER — SODIUM CHLORIDE 0.9 % IV SOLN
150.0000 mg/m2 | Freq: Once | INTRAVENOUS | Status: AC
  Administered 2019-05-07: 280 mg via INTRAVENOUS
  Filled 2019-05-07: qty 10

## 2019-05-07 MED ORDER — SODIUM CHLORIDE 0.9 % IV SOLN
150.0000 mg | Freq: Once | INTRAVENOUS | Status: AC
  Administered 2019-05-07: 150 mg via INTRAVENOUS
  Filled 2019-05-07: qty 150

## 2019-05-07 MED ORDER — SODIUM CHLORIDE 0.9 % IV SOLN
2400.0000 mg/m2 | INTRAVENOUS | Status: DC
  Administered 2019-05-07: 4650 mg via INTRAVENOUS
  Filled 2019-05-07: qty 93

## 2019-05-07 MED ORDER — ATROPINE SULFATE 1 MG/ML IJ SOLN
0.5000 mg | Freq: Once | INTRAMUSCULAR | Status: AC | PRN
  Administered 2019-05-07: 10:00:00 0.5 mg via INTRAVENOUS
  Filled 2019-05-07: qty 1

## 2019-05-07 MED ORDER — OXALIPLATIN CHEMO INJECTION 100 MG/20ML
68.0000 mg/m2 | Freq: Once | INTRAVENOUS | Status: AC
  Administered 2019-05-07: 130 mg via INTRAVENOUS
  Filled 2019-05-07: qty 26

## 2019-05-07 MED ORDER — PALONOSETRON HCL INJECTION 0.25 MG/5ML
0.2500 mg | Freq: Once | INTRAVENOUS | Status: AC
  Administered 2019-05-07: 0.25 mg via INTRAVENOUS
  Filled 2019-05-07: qty 5

## 2019-05-07 MED ORDER — SODIUM CHLORIDE 0.9% FLUSH: 10.0000 mL | INTRAVENOUS | Status: DC | PRN

## 2019-05-07 NOTE — Progress Notes (Signed)
Patient has been assessed, vital signs and labs have been reviewed by Dr. Delton Coombes. ANC, Creatinine, LFTs, and Platelets are within treatment parameters per Dr. Delton Coombes. The patient is good to proceed with treatment at this time. Increasing treatment doses to normal dose except Oxaliplatin which will still be dose reduced per Dr. Delton Coombes.

## 2019-05-07 NOTE — Patient Instructions (Signed)
[facility] Discharge Instructions for Patients Receiving Chemotherapy  Today you received the following chemotherapy agents   To help prevent nausea and vomiting after your treatment, we encourage you to take your nausea medication   If you develop nausea and vomiting that is not controlled by your nausea medication, call the clinic.   BELOW ARE SYMPTOMS THAT SHOULD BE REPORTED IMMEDIATELY:  *FEVER GREATER THAN 100.5 F  *CHILLS WITH OR WITHOUT FEVER  NAUSEA AND VOMITING THAT IS NOT CONTROLLED WITH YOUR NAUSEA MEDICATION  *UNUSUAL SHORTNESS OF BREATH  *UNUSUAL BRUISING OR BLEEDING  TENDERNESS IN MOUTH AND THROAT WITH OR WITHOUT PRESENCE OF ULCERS  *URINARY PROBLEMS  *BOWEL PROBLEMS  UNUSUAL RASH Items with * indicate a potential emergency and should be followed up as soon as possible.  Feel free to call the clinic should you have any questions or concerns. The clinic phone number is [phone].  Please show the CHEMO ALERT CARD at check-in to the Emergency Department and triage nurse.   

## 2019-05-07 NOTE — Patient Instructions (Addendum)
[facility] at [facility] Discharge Instructions  You were seen today by Dr. Katragadda. He went over your recent lab results. He will see you back in 2 weeks for labs, treatment and follow up.   Thank you for choosing [facility] at [facility] to provide your oncology and hematology care.  To afford each patient quality time with our provider, please arrive at least 15 minutes before your scheduled appointment time.   If you have a lab appointment with the Cancer Center please come in thru the  Main Entrance and check in at the main information desk  You need to re-schedule your appointment should you arrive 10 or more minutes late.  We strive to give you quality time with our providers, and arriving late affects you and other patients whose appointments are after yours.  Also, if you no show three or more times for appointments you may be dismissed from the clinic at the providers discretion.     Again, thank you for choosing [facility].  Our hope is that these requests will decrease the amount of time that you wait before being seen by our physicians.       _____________________________________________________________  Should you have questions after your visit to [facility], please contact our office at [phone] between the hours of 8:00 a.m. and 4:30 p.m.  Voicemails left after 4:00 p.m. will not be returned until the following business day.  For prescription refill requests, have your pharmacy contact our office and allow 72 hours.    Cancer Center Support Programs:   > Cancer Support Group  2nd Tuesday of the month 1pm-2pm, Journey Room    

## 2019-05-07 NOTE — Progress Notes (Signed)
Duane Alexander, [place] [address]   CLINIC:  Medical Oncology/Hematology  PCP:  [name] Kendall [place] [id number] [phone]   REASON FOR VISIT:  Follow-up for pancreatic adenocarcinoma.  CURRENT THERAPY: Neoadjuvant FOLFIRINOX.  BRIEF ONCOLOGIC HISTORY:  Oncology History  Pancreatic cancer ([place])  03/28/2019 Initial Diagnosis   Pancreatic cancer ([place])   03/28/2019 Cancer Staging   Staging form: Exocrine Pancreas, AJCC 8th Edition - Clinical stage from 03/28/2019: Stage III (cT4, cN1, cM0) - Signed by Derek Jack, MD on 03/28/2019   04/20/2019 Genetic Testing   Negative genetic testing:  No pathogenic variants detected on the [facility] Multi-Cancer panel. The report date is 04/20/2019.  The Multi-Cancer Panel offered by [facility] includes sequencing and/or deletion duplication testing of the following 85 genes: AIP, ALK, APC, ATM, AXIN2,BAP1,  BARD1, BLM, BMPR1A, BRCA1, BRCA2, BRIP1, CASR, CDC73, CDH1, CDK4, CDKN1B, CDKN1C, CDKN2A (p14ARF), CDKN2A (p16INK4a), CEBPA, CHEK2, CTNNA1, DICER1, DIS3L2, EGFR (c.2369C>T, p.Thr790Met variant only), EPCAM (Deletion/duplication testing only), FH, FLCN, GATA2, GPC3, GREM1 (Promoter region deletion/duplication testing only), HOXB13 (c.251G>A, p.Gly84Glu), HRAS, KIT, MAX, MEN1, MET, MITF (c.952G>A, p.Glu318Lys variant only), MLH1, MSH2, MSH3, MSH6, MUTYH, NBN, NF1, NF2, NTHL1, PALB2, PDGFRA, PHOX2B, PMS2, POLD1, POLE, POT1, PRKAR1A, PTCH1, PTEN, RAD50, RAD51C, RAD51D, RB1, RECQL4, RET, RNF43, RUNX1, SDHAF2, SDHA (sequence changes only), SDHB, SDHC, SDHD, SMAD4, SMARCA4, SMARCB1, SMARCE1, STK11, SUFU, TERC, TERT, TMEM127, TP53, TSC1, TSC2, VHL, WRN and WT1.   04/23/2019 -  Chemotherapy   The patient had palonosetron (ALOXI) injection 0.25 mg, 0.25 mg, Intravenous,  Once, 2 of 4 cycles Administration: 0.25 mg (04/23/2019), 0.25 mg (05/07/2019) pegfilgrastim-cbqv (UDENYCA) injection 6 mg,  6 mg, Subcutaneous, Once, 2 of 4 cycles Administration: 6 mg (04/25/2019), 6 mg (05/09/2019) irinotecan (CAMPTOSAR) 240 mg in sodium chloride 0.9 % 500 mL chemo infusion, 120 mg/m2 = 240 mg (80 % of original dose 150 mg/m2), Intravenous,  Once, 2 of 4 cycles Dose modification: 120 mg/m2 (80 % of original dose 150 mg/m2, Cycle 1, Reason: Other (see comments), Comment: cirrhosis) Administration: 240 mg (04/23/2019), 280 mg (05/07/2019) oxaliplatin (ELOXATIN) 130 mg in dextrose 5 % 500 mL chemo infusion, 68 mg/m2 = 130 mg (80 % of original dose 85 mg/m2), Intravenous,  Once, 2 of 4 cycles Dose modification: 68 mg/m2 (80 % of original dose 85 mg/m2, Cycle 1, Reason: Other (see comments), Comment: cirrhosis), 68 mg/m2 (80 % of original dose 85 mg/m2, Cycle 2, Reason: Provider Judgment) Administration: 130 mg (04/23/2019), 130 mg (05/07/2019) fosaprepitant (EMEND) 150 mg in sodium chloride 0.9 % 145 mL IVPB, 150 mg, Intravenous,  Once, 2 of 4 cycles Administration: 150 mg (04/23/2019), 150 mg (05/07/2019) fluorouracil (ADRUCIL) 3,700 mg in sodium chloride 0.9 % 76 mL chemo infusion, 1,920 mg/m2 = 3,700 mg (80 % of original dose 2,400 mg/m2), Intravenous, 1 Day/Dose, 2 of 4 cycles Dose modification: 1,920 mg/m2 (80 % of original dose 2,400 mg/m2, Cycle 1, Reason: Other (see comments), Comment: cirrhosis) Administration: 3,700 mg (04/23/2019), 4,650 mg (05/07/2019) leucovorin 618 mg in sodium chloride 0.9 % 250 mL infusion, 320 mg/m2 = 618 mg (80 % of original dose 400 mg/m2), Intravenous,  Once, 2 of 4 cycles Dose modification: 320 mg/m2 (80 % of original dose 400 mg/m2, Cycle 1, Reason: Other (see comments), Comment: cirrhosis) Administration: 618 mg (04/23/2019), 772 mg (05/07/2019)  for chemotherapy treatment.       CANCER STAGING: Cancer Staging Pancreatic cancer [facility]) Staging form: Exocrine Pancreas, AJCC 8th Edition -  Clinical stage from 03/28/2019: Stage III (cT4, cN1, cM0) - Signed by Derek Jack, MD  on 03/28/2019    INTERVAL HISTORY:  Duane Alexander 64 y.o. male seen for follow-up of pancreatic adenocarcinoma. Appetite and energy levels are 100%. Numbness in the hands and feet has been stable. Reports some constipation. Epigastric pain is controlled with pain medication. He had one episode of diarrhea which did not recur again.    REVIEW OF SYSTEMS:  Review of Systems  Gastrointestinal: Positive for abdominal pain and constipation.  Neurological: Positive for numbness.  All other systems reviewed and are negative.    PAST MEDICAL/SURGICAL HISTORY:  Past Medical History:  Diagnosis Date  . Aortic atherosclerosis ([place])   . COPD (chronic obstructive pulmonary disease) ([place])    per CT findings 2019  . Family history of brain cancer   . Family history of colon cancer   . Family history of kidney cancer   . Family history of leukemia   . Family history of lung cancer   . Family history of ovarian cancer   . Family history of prostate cancer   . Family history of stomach cancer   . GERD (gastroesophageal reflux disease)   . Hepatitis C    referred to hepatitis clinic 2019 but he never went  . Hyperlipidemia   . Hypertension   . Port-A-Cath in place 04/17/2019  . Smoker   . Wears glasses    Past Surgical History:  Procedure Laterality Date  . ANTERIOR CERVICAL DECOMP/DISCECTOMY FUSION  12/2017   Dr. Consuella Lose  . BIOPSY  03/11/2019   Procedure: BIOPSY;  Surgeon: [name]., MD;  Location: [facility];  Service: Gastroenterology;;  . COLONOSCOPY N/A 01/19/2017   Procedure: COLONOSCOPY;  Surgeon: Rogene Houston, MD;  [facility];  Service: Endoscopy;  Laterality: N/A;  930-moved to 9:45 per Lelon Frohlich  . ESOPHAGOGASTRODUODENOSCOPY (EGD) WITH PROPOFOL N/A 03/11/2019   Procedure: ESOPHAGOGASTRODUODENOSCOPY (EGD) WITH PROPOFOL;  Surgeon: Rush Landmark Telford Nab., MD;  Location: [facility];  Service: Gastroenterology;  Laterality: N/A;  . EUS N/A 03/11/2019    Procedure: UPPER ENDOSCOPIC ULTRASOUND (EUS) RADIAL;  Surgeon: Rush Landmark Telford Nab., MD;  Location: [facility];  Service: Gastroenterology;  Laterality: N/A;  . FINE NEEDLE ASPIRATION  03/11/2019   Procedure: FINE NEEDLE ASPIRATION (FNA) LINEAR;  Surgeon: [name]., MD;  Location: [facility];  Service: Gastroenterology;;  . POLYPECTOMY  01/19/2017   Procedure: POLYPECTOMY;  Surgeon: Rogene Houston, MD;  [facility];  Service: Endoscopy;;  splenic flexure  . POLYPECTOMY  03/11/2019   Procedure: POLYPECTOMY;  Surgeon: Mansouraty, Telford Nab., MD;  Location: [facility];  Service: Gastroenterology;;  . Sol Passer PLACEMENT Left 04/15/2019   Procedure: INSERTION PORT-A-CATH;  Surgeon: Aviva Signs, MD;  Location: [facility];  Service: General;  Laterality: Left;  . Right eye surgery     as a child-removed muscle from leg and put in upper eye lid     SOCIAL HISTORY:  Social History   Socioeconomic History  . Marital status: Widowed    Spouse name: Not on file  . Number of children: Not on file  . Years of education: Not on file  . Highest education level: Not on file  Occupational History    Employer: [employer]  Tobacco Use  . Smoking status: Current Every Day Smoker    Packs/day: 0.50    Years: 45.00    Pack years: 22.50    Types: Cigarettes  . Smokeless  tobacco: Never Used  Substance and Sexual Activity  . Alcohol use: Not Currently  . Drug use: No  . Sexual activity: Not Currently  Other Topics Concern  . Not on file  Social History Narrative  . Not on file   Social Determinants of Health   Financial Resource Strain: Low Risk   . Difficulty of Paying Living Expenses: Not hard at all  Food Insecurity: No Food Insecurity  . Worried About Charity fundraiser in the Last Year: Never true  . Ran Out of Food in the Last Year: Never true  Transportation Needs: No Transportation Needs  . Lack of Transportation (Medical): No  . Lack of  Transportation (Non-Medical): No  Physical Activity: Inactive  . Days of Exercise per Week: 0 days  . Minutes of Exercise per Session: 0 min  Stress: Stress Concern Present  . Feeling of Stress : Rather much  Social Connections: Moderately Isolated  . Frequency of Communication with Friends and Family: Three times a week  . Frequency of Social Gatherings with Friends and Family: Three times a week  . Attends Religious Services: Never  . Active Member of Clubs or Organizations: No  . Attends Archivist Meetings: Never  . Marital Status: Widowed  Intimate Partner Violence: Not At Risk  . Fear of Current or Ex-Partner: No  . Emotionally Abused: No  . Physically Abused: No  . Sexually Abused: No    FAMILY HISTORY:  Family History  Problem Relation Age of Onset  . Throat cancer Father 31       died in his 31s of stomach issues?  . Lung cancer Brother 29       smoker, non-small cell  . Ovarian cancer Sister 22       GI related death  . Heart attack Maternal Grandmother   . Brain cancer Maternal Aunt        dx. in her 17s  . Lung cancer Maternal Uncle        dx. in his 7s-60s, smoker  . Stomach cancer Maternal Aunt        dx. in her 95s  . Colon cancer Cousin        dx. >50 - maternal cousin  . Kidney cancer Cousin        dx. in his 31s - maternal cousin  . Prostate cancer Cousin        dx. in his 81s - maternal cousin  . Leukemia Nephew 34  . Heart disease Neg Hx   . Hypertension Neg Hx   . Stroke Neg Hx     CURRENT MEDICATIONS:  Outpatient Encounter Medications as of 05/07/2019  Medication Sig  . docusate sodium (COLACE) 100 MG capsule Take 100 mg by mouth daily.  . fluorouracil CALGB 83151 in sodium chloride 0.9 % 150 mL Inject 2,400 mg/m2 into the vein over 48 hr.  . FLUOROURACIL IV Inject into the vein every 21 ( twenty-one) days.  . IRINOTECAN HCL IV Inject into the vein every 14 (fourteen) days.  [name] LEUCOVORIN CALCIUM IV Inject into the vein every 14  (fourteen) days.  [name] lisinopril (ZESTRIL) 20 MG tablet TAKE 1 TABLET BY MOUTH DAILY  . lovastatin (MEVACOR) 20 MG tablet TAKE 1 TABLET BY MOUTH AT BEDTIME  . melatonin 5 MG TABS Take 5 mg by mouth at bedtime.  . Multiple Vitamin (MULTIVITAMIN) tablet Take 1 tablet by mouth daily.  . OXALIPLATIN IV Inject into the vein every 14 (fourteen)  days.  . lidocaine-prilocaine (EMLA) cream Apply a small amount to port a cath site and cover with plastic wrap one hour prior to chemotherapy appointments (Patient not taking: Reported on 04/29/2019)  . loperamide (IMODIUM A-D) 2 MG tablet Take 2 at onset of diarrhea, then 1 tab after every watery bowel movement - DO NOT EXCEED 8 tablets in a 24 hour period (Patient not taking: Reported on 04/23/2019)  . prochlorperazine (COMPAZINE) 10 MG tablet Take 1 tablet (10 mg total) by mouth every 6 (six) hours as needed (Nausea or vomiting). (Patient not taking: Reported on 04/23/2019)  . [DISCONTINUED] HYDROcodone-acetaminophen (NORCO/VICODIN) 5-325 MG tablet Take 1 tablet by mouth every 4 (four) hours as needed for moderate pain. (Patient not taking: Reported on 05/07/2019)   No facility-administered encounter medications on file as of 05/07/2019.    ALLERGIES:  No Known Allergies   PHYSICAL EXAM:  ECOG Performance status: 1  Vitals:   05/07/19 0811 05/07/19 0838  BP: 104/62 104/62  Pulse: (!) 55 (!) 55  Resp: 18 18  Temp: (!) 96.6 F (35.9 C) (!) 96.6 F (35.9 C)  SpO2: 100% 100%   Filed Weights   05/07/19 0811 05/07/19 0838  Weight: 167 lb (75.8 kg) 167 lb (75.8 kg)    Physical Exam Vitals reviewed.  Constitutional:      Appearance: Normal appearance.  Cardiovascular:     Rate and Rhythm: Normal rate and regular rhythm.     Heart sounds: Normal heart sounds.  Pulmonary:     Effort: Pulmonary effort is normal.     Breath sounds: Normal breath sounds.  Abdominal:     Palpations: Abdomen is soft. There is no mass.  Skin:    General: Skin is warm.    Neurological:     General: No focal deficit present.     Mental Status: He is alert and oriented to person, place, and time.  Psychiatric:        Mood and Affect: Mood normal.        Behavior: Behavior normal.      LABORATORY DATA:  I have reviewed the labs as listed.  CBC    Component Value Date/Time   WBC 18.3 (H) 05/07/2019 0829   RBC 3.81 (L) 05/07/2019 0829   HGB 12.8 (L) 05/07/2019 0829   HGB 14.7 08/10/2018 0927   HCT 39.0 05/07/2019 0829   HCT 43.9 08/10/2018 0927   PLT 169 05/07/2019 0829   PLT 252 08/10/2018 0927   MCV 102.4 (H) 05/07/2019 0829   MCV 101 (H) 08/10/2018 0927   MCH 33.6 05/07/2019 0829   MCHC 32.8 05/07/2019 0829   RDW 13.5 05/07/2019 0829   RDW 12.0 08/10/2018 0927   LYMPHSABS 3.4 05/07/2019 0829   LYMPHSABS 4.5 (H) 08/10/2018 0927   MONOABS 0.8 05/07/2019 0829   EOSABS 0.5 05/07/2019 0829   EOSABS 0.1 08/10/2018 0927   BASOSABS 0.1 05/07/2019 0829   BASOSABS 0.1 08/10/2018 0927   CMP Latest Ref Rng & Units 05/07/2019 04/29/2019 04/23/2019  Glucose 70 - 99 mg/dL 227(H) 124(H) 180(H)  BUN 8 - 23 mg/dL 23 25(H) 20  Creatinine 0.61 - 1.24 mg/dL 0.76 0.79 0.68  Sodium 135 - 145 mmol/L 137 135 136  Potassium 3.5 - 5.1 mmol/L 4.1 4.8 4.1  Chloride 98 - 111 mmol/L 103 102 104  CO2 22 - 32 mmol/L _0 Calcium 8.9 - 10.3 mg/dL 8.9 9.3 8.7(L)  Total Protein 6.5 - 8.1 g/dL 6.9 7.6 7.3  Total Bilirubin 0.3 - 1.2 mg/dL 0.4 0.7 0.7  Alkaline Phos 38 - 126 U/L 90 87 59  AST 15 - 41 U/L 31 34 39  ALT 0 - 44 U/L 39 47(H) 40       DIAGNOSTIC IMAGING:  I have reviewed scans.     ASSESSMENT & PLAN:   Pancreatic cancer ([place]) 1. Pancreatic adenocarcinoma ([place] 4 N1 [facility]): -Germline mutation testing is negative. -PET scan on 03/28/2019 did not show any evidence of metastatic disease. CA 19-9 was 40 on 02/13/2019. -Cycle 1 of FOLFIRINOX on 04/23/2019. This was 20% dose reduced. -He has tolerated first cycle reasonably well. I have reviewed his CBC  and LFTs. -He will proceed with cycle 2 today. I plan to increase her 5-FU and Irinotecan to regular full dose. Oxaliplatin will be at 80%. We will see him back in 2 weeks for follow-up. -CT pancreatic protocol will be done after 3 to 4 months of treatment.  2. Epigastric pain: -Throbbing pain in the epigastric region is stable and well controlled with hydrocodone 5/325 every 4 hours as needed.  3. Liver disease: -CT scan showed mild degree of cirrhosis. He has a history of hep C which was untreated. -His ALT has normalized today.  4. Tingling/numbness: -Numbness in the fingertips from his C-spine problem is stable. Numbness in the feet from lower back issues is also stable. We will keep a close eye on it. -Oxaliplatin is 20% dose reduced.      Orders placed this encounter:  No orders of the defined types were placed in this encounter.    Derek Jack, MD [place] [phone]

## 2019-05-07 NOTE — Progress Notes (Signed)
Labs reviewed today. Will proceed with treatment today per MD. Doses increased today on everything except for the oxaliplatin.   Treatment given per orders. Patient tolerated it well without problems. Vitals stable and discharged home from clinic ambulatory. Follow up as scheduled.

## 2019-05-08 ENCOUNTER — Other Ambulatory Visit: Payer: Self-pay

## 2019-05-09 ENCOUNTER — Other Ambulatory Visit: Payer: Self-pay

## 2019-05-09 ENCOUNTER — Encounter (HOSPITAL_COMMUNITY): Payer: Self-pay | Admitting: *Deleted

## 2019-05-09 ENCOUNTER — Inpatient Hospital Stay (HOSPITAL_COMMUNITY): Payer: BC Managed Care – PPO

## 2019-05-09 VITALS — BP 126/68 | HR 63 | Temp 96.9°F | Resp 18

## 2019-05-09 DIAGNOSIS — Z5189 Encounter for other specified aftercare: Secondary | ICD-10-CM | POA: Diagnosis not present

## 2019-05-09 DIAGNOSIS — Z95828 Presence of other vascular implants and grafts: Secondary | ICD-10-CM

## 2019-05-09 DIAGNOSIS — C250 Malignant neoplasm of head of pancreas: Secondary | ICD-10-CM

## 2019-05-09 DIAGNOSIS — Z5111 Encounter for antineoplastic chemotherapy: Secondary | ICD-10-CM | POA: Diagnosis not present

## 2019-05-09 MED ORDER — HEPARIN SOD (PORK) LOCK FLUSH 100 UNIT/ML IV SOLN
500.0000 [IU] | Freq: Once | INTRAVENOUS | Status: AC | PRN
  Administered 2019-05-09: 500 [IU]

## 2019-05-09 MED ORDER — SODIUM CHLORIDE 0.9% FLUSH
10.0000 mL | INTRAVENOUS | Status: DC | PRN
  Administered 2019-05-09: 10 mL

## 2019-05-09 MED ORDER — PEGFILGRASTIM-CBQV 6 MG/0.6ML ~~LOC~~ SOSY
6.0000 mg | PREFILLED_SYRINGE | Freq: Once | SUBCUTANEOUS | Status: AC
  Administered 2019-05-09: 6 mg via SUBCUTANEOUS
  Filled 2019-05-09: qty 0.6

## 2019-05-09 NOTE — Progress Notes (Signed)
PT here for pump d/c today. Denies any issues or pain. VSS. D/c clinic ambulatory.

## 2019-05-13 ENCOUNTER — Ambulatory Visit (HOSPITAL_COMMUNITY): Payer: BC Managed Care – PPO | Admitting: Hematology

## 2019-05-13 ENCOUNTER — Ambulatory Visit (HOSPITAL_COMMUNITY): Payer: BC Managed Care – PPO

## 2019-05-13 ENCOUNTER — Other Ambulatory Visit (HOSPITAL_COMMUNITY): Payer: BC Managed Care – PPO

## 2019-05-14 ENCOUNTER — Encounter (HOSPITAL_COMMUNITY): Payer: Self-pay | Admitting: *Deleted

## 2019-05-15 ENCOUNTER — Encounter (HOSPITAL_COMMUNITY): Payer: BC Managed Care – PPO

## 2019-05-16 ENCOUNTER — Other Ambulatory Visit (HOSPITAL_COMMUNITY): Payer: Self-pay

## 2019-05-16 DIAGNOSIS — C250 Malignant neoplasm of head of pancreas: Secondary | ICD-10-CM

## 2019-05-16 MED ORDER — HYDROCODONE-ACETAMINOPHEN 5-325 MG PO TABS: 1.0000 | ORAL_TABLET | ORAL | 0 refills | Status: DC | PRN

## 2019-05-18 NOTE — Assessment & Plan Note (Signed)
1. Pancreatic adenocarcinoma ([place] 4 N1 MX): -Germline mutation testing is negative. -PET scan on 03/28/2019 did not show any evidence of metastatic disease. CA 19-9 was 40 on 02/13/2019. -Cycle 1 of FOLFIRINOX on 04/23/2019. This was 20% dose reduced. -He has tolerated first cycle reasonably well. I have reviewed his CBC and LFTs. -He will proceed with cycle 2 today. I plan to increase her 5-FU and Irinotecan to regular full dose. Oxaliplatin will be at 80%. We will see him back in 2 weeks for follow-up. -CT pancreatic protocol will be done after 3 to 4 months of treatment.  2. Epigastric pain: -Throbbing pain in the epigastric region is stable and well controlled with hydrocodone 5/325 every 4 hours as needed.  3. Liver disease: -CT scan showed mild degree of cirrhosis. He has a history of hep C which was untreated. -His ALT has normalized today.  4. Tingling/numbness: -Numbness in the fingertips from his C-spine problem is stable. Numbness in the feet from lower back issues is also stable. We will keep a close eye on it. -Oxaliplatin is 20% dose reduced.

## 2019-05-20 NOTE — Progress Notes (Signed)
Duane Alexander, [place] [address]   CLINIC:  Medical Oncology/Hematology  PCP:  Carlena Hurl, PA-C [address] / [place] [address] [phone]   REASON FOR VISIT:  Follow-up for pancreatic adenocarcinoma  CURRENT THERAPY: Continue with planned chemotherapy treatment FOLFOX  BRIEF ONCOLOGIC HISTORY:  Oncology History  Pancreatic cancer ([place])  03/28/2019 Initial Diagnosis   Pancreatic cancer ([place])   03/28/2019 Cancer Staging   Staging form: Exocrine Pancreas, AJCC 8th Edition - Clinical stage from 03/28/2019: Stage III (cT4, cN1, cM0) - Signed by Derek Jack, MD on 03/28/2019   04/20/2019 Genetic Testing   Negative genetic testing:  No pathogenic variants detected on the [facility] Multi-Cancer panel. The report date is 04/20/2019.  The Multi-Cancer Panel offered by [facility] includes sequencing and/or deletion duplication testing of the following 85 genes: AIP, ALK, APC, ATM, AXIN2,BAP1,  BARD1, BLM, BMPR1A, BRCA1, BRCA2, BRIP1, CASR, CDC73, CDH1, CDK4, CDKN1B, CDKN1C, CDKN2A (p14ARF), CDKN2A (p16INK4a), CEBPA, CHEK2, CTNNA1, DICER1, DIS3L2, EGFR (c.2369C>T, p.Thr790Met variant only), EPCAM (Deletion/duplication testing only), FH, FLCN, GATA2, GPC3, GREM1 (Promoter region deletion/duplication testing only), HOXB13 (c.251G>A, p.Gly84Glu), HRAS, KIT, MAX, MEN1, MET, MITF (c.952G>A, p.Glu318Lys variant only), MLH1, MSH2, MSH3, MSH6, MUTYH, NBN, NF1, NF2, NTHL1, PALB2, PDGFRA, PHOX2B, PMS2, POLD1, POLE, POT1, PRKAR1A, PTCH1, PTEN, RAD50, RAD51C, RAD51D, RB1, RECQL4, RET, RNF43, RUNX1, SDHAF2, SDHA (sequence changes only), SDHB, SDHC, SDHD, SMAD4, SMARCA4, SMARCB1, SMARCE1, STK11, SUFU, TERC, TERT, TMEM127, TP53, TSC1, TSC2, VHL, WRN and WT1.   04/23/2019 -  Chemotherapy   The patient had palonosetron (ALOXI) injection 0.25 mg, 0.25 mg, Intravenous,  Once, 3 of 4 cycles Administration: 0.25 mg (04/23/2019), 0.25 mg (05/07/2019), 0.25 mg  (05/21/2019) pegfilgrastim-cbqv (UDENYCA) injection 6 mg, 6 mg, Subcutaneous, Once, 3 of 4 cycles Administration: 6 mg (04/25/2019), 6 mg (05/09/2019) irinotecan (CAMPTOSAR) 240 mg in sodium chloride 0.9 % 500 mL chemo infusion, 120 mg/m2 = 240 mg (80 % of original dose 150 mg/m2), Intravenous,  Once, 3 of 4 cycles Dose modification: 120 mg/m2 (80 % of original dose 150 mg/m2, Cycle 1, Reason: Other (see comments), Comment: cirrhosis) Administration: 240 mg (04/23/2019), 280 mg (05/07/2019), 280 mg (05/21/2019) oxaliplatin (ELOXATIN) 130 mg in dextrose 5 % 500 mL chemo infusion, 68 mg/m2 = 130 mg (80 % of original dose 85 mg/m2), Intravenous,  Once, 3 of 4 cycles Dose modification: 68 mg/m2 (80 % of original dose 85 mg/m2, Cycle 1, Reason: Other (see comments), Comment: cirrhosis), 68 mg/m2 (80 % of original dose 85 mg/m2, Cycle 2, Reason: Provider Judgment), 68 mg/m2 (80 % of original dose 85 mg/m2, Cycle 3, Reason: Other (see comments), Comment: neuropathy) Administration: 130 mg (04/23/2019), 130 mg (05/07/2019), 130 mg (05/21/2019) fosaprepitant (EMEND) 150 mg in sodium chloride 0.9 % 145 mL IVPB, 150 mg, Intravenous,  Once, 3 of 4 cycles Administration: 150 mg (04/23/2019), 150 mg (05/07/2019), 150 mg (05/21/2019) fluorouracil (ADRUCIL) 3,700 mg in sodium chloride 0.9 % 76 mL chemo infusion, 1,920 mg/m2 = 3,700 mg (80 % of original dose 2,400 mg/m2), Intravenous, 1 Day/Dose, 3 of 4 cycles Dose modification: 1,920 mg/m2 (80 % of original dose 2,400 mg/m2, Cycle 1, Reason: Other (see comments), Comment: cirrhosis) Administration: 3,700 mg (04/23/2019), 4,650 mg (05/07/2019), 4,650 mg (05/21/2019) leucovorin 618 mg in sodium chloride 0.9 % 250 mL infusion, 320 mg/m2 = 618 mg (80 % of original dose 400 mg/m2), Intravenous,  Once, 3 of 4 cycles Dose modification: 320 mg/m2 (80 % of original dose 400 mg/m2, Cycle 1,  Reason: Other (see comments), Comment: cirrhosis) Administration: 618 mg (04/23/2019), 772 mg (05/07/2019),  772 mg (05/21/2019)  for chemotherapy treatment.      CANCER STAGING: Cancer Staging Pancreatic cancer [facility]) Staging form: Exocrine Pancreas, AJCC 8th Edition - Clinical stage from 03/28/2019: Stage III (cT4, cN1, cM0) - Signed by Derek Jack, MD on 03/28/2019   INTERVAL HISTORY:  Mr. Jeppsen 64 y.o. male returns for routine follow-up and consideration for next cycle of chemotherapy.   Due for cycle #3 of FOLFOX today.   Overall, he tells me he has been feeling pretty well. He is having difficulty with constipation and diarrhea; he will get constipated and then take imodium which will give him diarrhea. He did have some pretty severe numbness in his fingers for a few days.  Overall, he feels ready for next cycle of chemo today.    REVIEW OF SYSTEMS:  Review of Systems  Constitutional: Negative for appetite change (good appetite), chills, fatigue, fever and unexpected weight change.  HENT:   Negative for mouth sores, sore throat and trouble swallowing.   Eyes: Negative for eye problems.  Respiratory: Negative for chest tightness, cough and shortness of breath.   Cardiovascular: Negative for chest pain and palpitations.  Gastrointestinal: Positive for abdominal pain (w/ constipation), constipation and diarrhea. Negative for nausea and vomiting.  Genitourinary: Negative for frequency and hematuria.   Musculoskeletal: Negative for arthralgias and myalgias.  Skin: Negative for rash.  Neurological: Positive for numbness (severe). Negative for dizziness, headaches and light-headedness.  Hematological: Does not bruise/bleed easily.  Psychiatric/Behavioral: Negative for depression. The patient is not nervous/anxious.     PAST MEDICAL/SURGICAL HISTORY:  Past Medical History:  Diagnosis Date  . Aortic atherosclerosis ([place])   . COPD (chronic obstructive pulmonary disease) ([place])    per CT findings 2019  . Family history of brain cancer   . Family history of colon cancer   .  Family history of kidney cancer   . Family history of leukemia   . Family history of lung cancer   . Family history of ovarian cancer   . Family history of prostate cancer   . Family history of stomach cancer   . GERD (gastroesophageal reflux disease)   . Hepatitis C    referred to hepatitis clinic 2019 but he never went  . Hyperlipidemia   . Hypertension   . Port-A-Cath in place 04/17/2019  . Smoker   . Wears glasses    Past Surgical History:  Procedure Laterality Date  . ANTERIOR CERVICAL DECOMP/DISCECTOMY FUSION  12/2017   Dr. Consuella Lose  . BIOPSY  03/11/2019   Procedure: BIOPSY;  Surgeon: [name]., MD;  Location: [place];  Service: Gastroenterology;;  . COLONOSCOPY N/A 01/19/2017   Procedure: COLONOSCOPY;  Surgeon: Rogene Houston, MD;  [facility];  Service: Endoscopy;  Laterality: N/A;  930-moved to 9:45 per Lelon Frohlich  . ESOPHAGOGASTRODUODENOSCOPY (EGD) WITH PROPOFOL N/A 03/11/2019   Procedure: ESOPHAGOGASTRODUODENOSCOPY (EGD) WITH PROPOFOL;  Surgeon: Rush Landmark Telford Nab., MD;  Location: [facility];  Service: Gastroenterology;  Laterality: N/A;  . EUS N/A 03/11/2019   Procedure: UPPER ENDOSCOPIC ULTRASOUND (EUS) RADIAL;  Surgeon: Rush Landmark Telford Nab., MD;  Location: [place];  Service: Gastroenterology;  Laterality: N/A;  . FINE NEEDLE ASPIRATION  03/11/2019   Procedure: FINE NEEDLE ASPIRATION (FNA) LINEAR;  Surgeon: [name]., MD;  Location: [facility];  Service: Gastroenterology;;  . POLYPECTOMY  01/19/2017   Procedure: POLYPECTOMY;  Surgeon: Rogene Houston, MD;  [facility];  Service: Endoscopy;;  splenic flexure  . POLYPECTOMY  03/11/2019   Procedure: POLYPECTOMY;  Surgeon: Mansouraty, Telford Nab., MD;  Location: [place];  Service: Gastroenterology;;  . Sol Passer PLACEMENT Left 04/15/2019   Procedure: INSERTION PORT-A-CATH;  Surgeon: Aviva Signs, MD;  Location: [facility];  Service: General;  Laterality:  Left;  . Right eye surgery     as a child-removed muscle from leg and put in upper eye lid    SOCIAL HISTORY:  Social History   Socioeconomic History  . Marital status: Widowed    Spouse name: Not on file  . Number of children: Not on file  . Years of education: Not on file  . Highest education level: Not on file  Occupational History    Employer: [employer]  Tobacco Use  . Smoking status: Current Every Day Smoker    Packs/day: 0.50    Years: 45.00    Pack years: 22.50    Types: Cigarettes  . Smokeless tobacco: Never Used  Substance and Sexual Activity  . Alcohol use: Not Currently  . Drug use: No  . Sexual activity: Not Currently  Other Topics Concern  . Not on file  Social History Narrative  . Not on file   Social Determinants of Health   Financial Resource Strain: Low Risk   . Difficulty of Paying Living Expenses: Not hard at all  Food Insecurity: No Food Insecurity  . Worried About Charity fundraiser in the Last Year: Never true  . Ran Out of Food in the Last Year: Never true  Transportation Needs: No Transportation Needs  . Lack of Transportation (Medical): No  . Lack of Transportation (Non-Medical): No  Physical Activity: Inactive  . Days of Exercise per Week: 0 days  . Minutes of Exercise per Session: 0 min  Stress: Stress Concern Present  . Feeling of Stress : Rather much  Social Connections: Moderately Isolated  . Frequency of Communication with Friends and Family: Three times a week  . Frequency of Social Gatherings with Friends and Family: Three times a week  . Attends Religious Services: Never  . Active Member of Clubs or Organizations: No  . Attends Archivist Meetings: Never  . Marital Status: Widowed  Intimate Partner Violence: Not At Risk  . Fear of Current or Ex-Partner: No  . Emotionally Abused: No  . Physically Abused: No  . Sexually Abused: No    FAMILY HISTORY:  Family History  Problem Relation Age of Onset  .  Throat cancer Father 36       died in his 80s of stomach issues?  . Lung cancer Brother 16       smoker, non-small cell  . Ovarian cancer Sister 79       GI related death  . Heart attack Maternal Grandmother   . Brain cancer Maternal Aunt        dx. in her 61s  . Lung cancer Maternal Uncle        dx. in his 9s-60s, smoker  . Stomach cancer Maternal Aunt        dx. in her 28s  . Colon cancer Cousin        dx. >50 - maternal cousin  . Kidney cancer Cousin        dx. in his 69s - maternal cousin  . Prostate cancer Cousin        dx. in his 72s - maternal cousin  . Leukemia  Nephew 34  . Heart disease Neg Hx   . Hypertension Neg Hx   . Stroke Neg Hx     CURRENT MEDICATIONS:  Current Outpatient Medications  Medication Sig Dispense Refill  . lidocaine-prilocaine (EMLA) cream Apply a small amount to port a cath site and cover with plastic wrap one hour prior to chemotherapy appointments 30 g 3  . loperamide (IMODIUM A-D) 2 MG tablet Take 2 at onset of diarrhea, then 1 tab after every watery bowel movement - DO NOT EXCEED 8 tablets in a 24 hour period 100 tablet 1  . docusate sodium (COLACE) 100 MG capsule Take 100 mg by mouth daily.    . fluorouracil CALGB 63016 in sodium chloride 0.9 % 150 mL Inject 2,400 mg/m2 into the vein over 48 hr.    . FLUOROURACIL IV Inject into the vein every 21 ( twenty-one) days.    [name] HYDROcodone-acetaminophen (NORCO/VICODIN) 5-325 MG tablet Take 1 tablet by mouth every 4 (four) hours as needed for moderate pain. 84 tablet 0  . IRINOTECAN HCL IV Inject into the vein every 14 (fourteen) days.    [name] LEUCOVORIN CALCIUM IV Inject into the vein every 14 (fourteen) days.    [name] lisinopril (ZESTRIL) 20 MG tablet TAKE 1 TABLET BY MOUTH DAILY 30 tablet 0  . lovastatin (MEVACOR) 20 MG tablet TAKE 1 TABLET BY MOUTH AT BEDTIME 30 tablet 0  . melatonin 5 MG TABS Take 5 mg by mouth at bedtime.    . Multiple Vitamin (MULTIVITAMIN) tablet Take 1 tablet by mouth daily.    .  OXALIPLATIN IV Inject into the vein every 14 (fourteen) days.    . prochlorperazine (COMPAZINE) 10 MG tablet Take 1 tablet (10 mg total) by mouth every 6 (six) hours as needed (Nausea or vomiting). (Patient not taking: Reported on 04/23/2019) 60 tablet 3   No current facility-administered medications for this visit.   Facility-Administered Medications Ordered in Other Visits  Medication Dose Route Frequency Provider Last Rate Last Admin  . fluorouracil (ADRUCIL) 4,650 mg in sodium chloride 0.9 % 57 mL chemo infusion  2,400 mg/m2 (Treatment Plan Recorded) Intravenous 1 day or 1 dose Derek Jack, MD   4,650 mg at 05/21/19 1530  . sodium chloride flush (NS) 0.9 % injection 10 mL  10 mL Intracatheter PRN Derek Jack, MD        ALLERGIES:  No Known Allergies  PHYSICAL EXAM:  Performance status (ECOG): 1 - Symptomatic but completely ambulatory  Vitals:   05/21/19 0907  BP: 120/68  Pulse: 61  Resp: 18  Temp: (!) 96.9 F (36.1 C)  SpO2: 99%   Wt Readings from Last 3 Encounters:  05/21/19 163 lb 11.2 oz (74.3 kg)  05/07/19 167 lb (75.8 kg)  04/29/19 159 lb 4.8 oz (72.3 kg)   Physical Exam  Constitutional: He is oriented to person, place, and time. He appears well-developed and well-nourished. No distress. Face mask in place.  HENT:  Head: Normocephalic and atraumatic.  Right Ear: Hearing normal.  Left Ear: Hearing normal.  Mouth/Throat: Oropharynx is clear and moist and mucous membranes are normal. No oral lesions.  Eyes: Pupils are equal, round, and reactive to light. Conjunctivae and EOM are normal.  Cardiovascular: Normal rate, regular rhythm and normal heart sounds. Exam reveals no gallop and no friction rub.  No murmur heard. Pulmonary/Chest: Effort normal and breath sounds normal. He has no wheezes. He has no rhonchi. He has no rales.  Abdominal: Soft. Normal appearance and bowel  sounds are normal. He exhibits no mass. There is no hepatosplenomegaly. There is  no abdominal tenderness. There is no CVA tenderness.  Musculoskeletal:        General: No tenderness or edema. Normal range of motion.     Cervical back: Normal range of motion and neck supple.  Lymphadenopathy:    He has no cervical adenopathy.       Right cervical: No superficial cervical adenopathy present.   He has no axillary adenopathy.       Right: No inguinal adenopathy present.       Left: No inguinal adenopathy present.  Neurological: He is alert and oriented to person, place, and time.  Skin: Skin is warm, dry and intact. No bruising, no lesion and no rash noted. No erythema.  Psychiatric: He has a normal mood and affect. His behavior is normal. Judgment and thought content normal.    LABORATORY DATA:  I have reviewed the labs as listed.  CBC Latest Ref Rng & Units 05/21/2019 05/07/2019 04/29/2019  WBC 4.0 - 10.5 K/uL 17.3(H) 18.3(H) 20.2(H)  Hemoglobin 13.0 - 17.0 g/dL 12.2(L) 12.8(L) 13.7  Hematocrit 39.0 - 52.0 % 36.2(L) 39.0 42.1  Platelets 150 - 400 K/uL 189 169 144(L)   CMP Latest Ref Rng & Units 05/21/2019 05/07/2019 04/29/2019  Glucose 70 - 99 mg/dL 215(H) 227(H) 124(H)  BUN 8 - 23 mg/dL 9 23 25(H)  Creatinine 0.61 - 1.24 mg/dL 0.57(L) 0.76 0.79  Sodium 135 - 145 mmol/L 138 137 135  Potassium 3.5 - 5.1 mmol/L 3.8 4.1 4.8  Chloride 98 - 111 mmol/L 104 103 102  CO2 22 - 32 mmol/L _0 Calcium 8.9 - 10.3 mg/dL 8.7(L) 8.9 9.3  Total Protein 6.5 - 8.1 g/dL 6.5 6.9 7.6  Total Bilirubin 0.3 - 1.2 mg/dL 0.4 0.4 0.7  Alkaline Phos 38 - 126 U/L 88 90 87  AST 15 - 41 U/L 31 31 34  ALT 0 - 44 U/L 45(H) 39 47(H)    DIAGNOSTIC IMAGING:  I have independently reviewed the scans and discussed with the patient.  ASSESSMENT & PLAN:  Pancreatic cancer ([place]) 1.  Pancreatic adenocarcinoma (UT 4 N1 MX): -Germline mutation testing was negative. -PET scan on 03/28/2019 did not show any evidence of metastatic disease.  CA 19-9 was 40 on 02/13/2019. -2 cycles of FOLFIRINOX on  04/23/2019 and 05/07/2019.  Oxaliplatin was 20% dose reduced. -Had some constipation alternating with diarrhea.  Was told to take Imodium and stool softener as needed. -I have reviewed his labs.  ALT is slightly elevated at 45.  We will keep a close eye on it.  He will proceed with cycle 3 without any dose modifications.  2.  Tingling/numbness: -Numbness in the fingertips from C-spine problem present.  However 1 day after last treatment he said he could not feel his fingers.  This has improved subsequently.  Numbness in the toes has been stable. -Hence I will continue the oxaliplatin dose at 80%.  3.  Epigastric pain: -This is well controlled with hydrocodone 5/325 every 4 hours as needed.  4.  Liver disease: -CT scan showed mild degree of cirrhosis.  History of hep C which was untreated. -ALT is mildly elevated at 45.  We will keep a close eye on it.    Orders placed this encounter:  No orders of the defined types were placed in this encounter.     Derek Jack, MD, 05/21/19 7:40 PM  [place] [phone]  I, [employer], am acting as a Education administrator for Dr. Sanda Linger.  I, Derek Jack MD, have reviewed the above documentation for accuracy and completeness, and I agree with the above.

## 2019-05-21 ENCOUNTER — Inpatient Hospital Stay (HOSPITAL_COMMUNITY): Payer: BC Managed Care – PPO

## 2019-05-21 ENCOUNTER — Other Ambulatory Visit: Payer: Self-pay | Admitting: Medical

## 2019-05-21 ENCOUNTER — Inpatient Hospital Stay (HOSPITAL_BASED_OUTPATIENT_CLINIC_OR_DEPARTMENT_OTHER): Payer: BC Managed Care – PPO | Admitting: Hematology

## 2019-05-21 ENCOUNTER — Other Ambulatory Visit: Payer: Self-pay

## 2019-05-21 ENCOUNTER — Encounter (HOSPITAL_COMMUNITY): Payer: Self-pay | Admitting: Hematology

## 2019-05-21 VITALS — BP 120/68 | HR 60 | Temp 97.5°F | Resp 16

## 2019-05-21 DIAGNOSIS — C250 Malignant neoplasm of head of pancreas: Secondary | ICD-10-CM

## 2019-05-21 DIAGNOSIS — Z95828 Presence of other vascular implants and grafts: Secondary | ICD-10-CM

## 2019-05-21 DIAGNOSIS — C259 Malignant neoplasm of pancreas, unspecified: Secondary | ICD-10-CM | POA: Diagnosis not present

## 2019-05-21 DIAGNOSIS — Z5189 Encounter for other specified aftercare: Secondary | ICD-10-CM | POA: Diagnosis not present

## 2019-05-21 DIAGNOSIS — Z5111 Encounter for antineoplastic chemotherapy: Secondary | ICD-10-CM | POA: Diagnosis not present

## 2019-05-21 LAB — COMPREHENSIVE METABOLIC PANEL
ALT: 45 U/L — ABNORMAL HIGH (ref 0–44)
AST: 31 U/L (ref 15–41)
Albumin: 3.2 g/dL — ABNORMAL LOW (ref 3.5–5.0)
Alkaline Phosphatase: 88 U/L (ref 38–126)
Anion gap: 11 (ref 5–15)
BUN: 9 mg/dL (ref 8–23)
CO2: 23 mmol/L (ref 22–32)
Calcium: 8.7 mg/dL — ABNORMAL LOW (ref 8.9–10.3)
Chloride: 104 mmol/L (ref 98–111)
Creatinine, Ser: 0.57 mg/dL — ABNORMAL LOW (ref 0.61–1.24)
GFR calc Af Amer: >60 mL/min (ref >60)
GFR calc non Af Amer: >60 mL/min (ref >60)
Glucose, Bld: 215 mg/dL — ABNORMAL HIGH (ref 70–99)
Potassium: 3.8 mmol/L (ref 3.5–5.1)
Sodium: 138 mmol/L (ref 135–145)
Total Bilirubin: 0.4 mg/dL (ref 0.3–1.2)
Total Protein: 6.5 g/dL (ref 6.5–8.1)

## 2019-05-21 LAB — CBC WITH DIFFERENTIAL/PLATELET
Abs Immature Granulocytes: 0.56 10*3/uL — ABNORMAL HIGH (ref 0.00–0.07)
Basophils Absolute: 0.1 10*3/uL (ref 0.0–0.1)
Basophils Relative: 1 %
Eosinophils Absolute: 0.1 10*3/uL (ref 0.0–0.5)
Eosinophils Relative: 1 %
HCT: 36.2 % — ABNORMAL LOW (ref 39.0–52.0)
Hemoglobin: 12.2 g/dL — ABNORMAL LOW (ref 13.0–17.0)
Immature Granulocytes: 3 %
Lymphocytes Relative: 16 %
Lymphs Abs: 2.7 10*3/uL (ref 0.7–4.0)
MCH: 34 pg (ref 26.0–34.0)
MCHC: 33.7 g/dL (ref 30.0–36.0)
MCV: 100.8 fL — ABNORMAL HIGH (ref 80.0–100.0)
Monocytes Absolute: 1 10*3/uL (ref 0.1–1.0)
Monocytes Relative: 6 %
Neutro Abs: 12.8 10*3/uL — ABNORMAL HIGH (ref 1.7–7.7)
Neutrophils Relative %: 73 %
Platelets: 189 10*3/uL (ref 150–400)
RBC: 3.59 MIL/uL — ABNORMAL LOW (ref 4.22–5.81)
RDW: 14 % (ref 11.5–15.5)
WBC: 17.3 10*3/uL — ABNORMAL HIGH (ref 4.0–10.5)
nRBC: 0.1 % (ref 0.0–0.2)

## 2019-05-21 MED ORDER — SODIUM CHLORIDE 0.9 % IV SOLN
10.0000 mg | Freq: Once | INTRAVENOUS | Status: AC
  Administered 2019-05-21: 10 mg via INTRAVENOUS
  Filled 2019-05-21: qty 10

## 2019-05-21 MED ORDER — OXALIPLATIN CHEMO INJECTION 100 MG/20ML
68.0000 mg/m2 | Freq: Once | INTRAVENOUS | Status: AC
  Administered 2019-05-21: 130 mg via INTRAVENOUS
  Filled 2019-05-21: qty 26

## 2019-05-21 MED ORDER — SODIUM CHLORIDE 0.9% FLUSH: 10.0000 mL | INTRAVENOUS | Status: DC | PRN

## 2019-05-21 MED ORDER — SODIUM CHLORIDE 0.9 % IV SOLN
400.0000 mg/m2 | Freq: Once | INTRAVENOUS | Status: AC
  Administered 2019-05-21: 772 mg via INTRAVENOUS
  Filled 2019-05-21: qty 5

## 2019-05-21 MED ORDER — SODIUM CHLORIDE 0.9 % IV SOLN
150.0000 mg | Freq: Once | INTRAVENOUS | Status: AC
  Administered 2019-05-21: 150 mg via INTRAVENOUS
  Filled 2019-05-21: qty 150

## 2019-05-21 MED ORDER — DEXTROSE 5 % IV SOLN: Freq: Once | INTRAVENOUS | Status: AC

## 2019-05-21 MED ORDER — ATROPINE SULFATE 1 MG/ML IJ SOLN
0.5000 mg | Freq: Once | INTRAMUSCULAR | Status: AC | PRN
  Administered 2019-05-21: 0.5 mg via INTRAVENOUS
  Filled 2019-05-21: qty 1

## 2019-05-21 MED ORDER — SODIUM CHLORIDE 0.9 % IV SOLN
2400.0000 mg/m2 | INTRAVENOUS | Status: DC
  Administered 2019-05-21: 4650 mg via INTRAVENOUS
  Filled 2019-05-21: qty 93

## 2019-05-21 MED ORDER — SODIUM CHLORIDE 0.9 % IV SOLN
150.0000 mg/m2 | Freq: Once | INTRAVENOUS | Status: AC
  Administered 2019-05-21: 280 mg via INTRAVENOUS
  Filled 2019-05-21: qty 4

## 2019-05-21 MED ORDER — PALONOSETRON HCL INJECTION 0.25 MG/5ML
0.2500 mg | Freq: Once | INTRAVENOUS | Status: AC
  Administered 2019-05-21: 0.25 mg via INTRAVENOUS
  Filled 2019-05-21: qty 5

## 2019-05-21 NOTE — Patient Instructions (Signed)
[facility] Discharge Instructions for Patients Receiving Chemotherapy  Today you received the following chemotherapy agents   To help prevent nausea and vomiting after your treatment, we encourage you to take your nausea medication   If you develop nausea and vomiting that is not controlled by your nausea medication, call the clinic.   BELOW ARE SYMPTOMS THAT SHOULD BE REPORTED IMMEDIATELY:  *FEVER GREATER THAN 100.5 F  *CHILLS WITH OR WITHOUT FEVER  NAUSEA AND VOMITING THAT IS NOT CONTROLLED WITH YOUR NAUSEA MEDICATION  *UNUSUAL SHORTNESS OF BREATH  *UNUSUAL BRUISING OR BLEEDING  TENDERNESS IN MOUTH AND THROAT WITH OR WITHOUT PRESENCE OF ULCERS  *URINARY PROBLEMS  *BOWEL PROBLEMS  UNUSUAL RASH Items with * indicate a potential emergency and should be followed up as soon as possible.  Feel free to call the clinic should you have any questions or concerns. The clinic phone number is [phone].  Please show the CHEMO ALERT CARD at check-in to the Emergency Department and triage nurse.   

## 2019-05-21 NOTE — Progress Notes (Signed)
Labs reviewed with MD today. Proceed as planned per MD.  Treatment given per orders. Patient tolerated it well without problems. Vitals stable and discharged home from clinic ambulatory. Follow up as scheduled.  

## 2019-05-21 NOTE — Telephone Encounter (Signed)
Sent message via mychart for patient to call and schedule appointment for additional refills.

## 2019-05-21 NOTE — Progress Notes (Signed)
Patient has been assessed, vital signs and labs have been reviewed by Dr. Katragadda. ANC, Creatinine, LFTs, and Platelets are within treatment parameters per Dr. Katragadda. The patient is good to proceed with treatment at this time.  

## 2019-05-21 NOTE — Patient Instructions (Signed)
[facility] at [facility] Discharge Instructions  Labs drawn from portacath today   Thank you for choosing  Cancer Center at [facility] to provide your oncology and hematology care.  To afford each patient quality time with our provider, please arrive at least 15 minutes before your scheduled appointment time.   If you have a lab appointment with the Cancer Center please come in thru the Main Entrance and check in at the main information desk.  You need to re-schedule your appointment should you arrive 10 or more minutes late.  We strive to give you quality time with our providers, and arriving late affects you and other patients whose appointments are after yours.  Also, if you no show three or more times for appointments you may be dismissed from the clinic at the providers discretion.     Again, thank you for choosing [facility].  Our hope is that these requests will decrease the amount of time that you wait before being seen by our physicians.       _____________________________________________________________  Should you have questions after your visit to  Cancer Center, please contact our office at [phone] between the hours of 8:00 a.m. and 4:30 p.m.  Voicemails left after 4:00 p.m. will not be returned until the following business day.  For prescription refill requests, have your pharmacy contact our office and allow 72 hours.    Due to Covid, you will need to wear a mask upon entering the hospital. If you do not have a mask, a mask will be given to you at the Main Entrance upon arrival. For doctor visits, patients may have 1 support person with them. For treatment visits, patients can not have anyone with them due to social distancing guidelines and our immunocompromised population.     

## 2019-05-21 NOTE — Patient Instructions (Signed)
[place] at [facility] Discharge Instructions  You were seen today by Dr. Delton Coombes. He went over your recent results. He will see you back in 2 weeks for labs and follow up. You received your chemotherapy treatment today.    Thank you for choosing [place] at [facility] to provide your oncology and hematology care.  To afford each patient quality time with our provider, please arrive at least 15 minutes before your scheduled appointment time.   If you have a lab appointment with the [place] please come in thru the  Main Entrance and check in at the main information desk  You need to re-schedule your appointment should you arrive 10 or more minutes late.  We strive to give you quality time with our providers, and arriving late affects you and other patients whose appointments are after yours.  Also, if you no show three or more times for appointments you may be dismissed from the clinic at the providers discretion.     Again, thank you for choosing [facility].  Our hope is that these requests will decrease the amount of time that you wait before being seen by our physicians.       _____________________________________________________________  Should you have questions after your visit to [facility], please contact our office at [phone] between the hours of 8:00 a.m. and 4:30 p.m.  Voicemails left after 4:00 p.m. will not be returned until the following business day.  For prescription refill requests, have your pharmacy contact our office and allow 72 hours.    Cancer Center Support Programs:   > Cancer Support Group  2nd Tuesday of the month 1pm-2pm, Journey Room

## 2019-05-21 NOTE — Assessment & Plan Note (Signed)
1.  Pancreatic adenocarcinoma (UT 4 N1 MX): -Germline mutation testing was negative. -PET scan on 03/28/2019 did not show any evidence of metastatic disease.  CA 19-9 was 40 on 02/13/2019. -2 cycles of FOLFIRINOX on 04/23/2019 and 05/07/2019.  Oxaliplatin was 20% dose reduced. -Had some constipation alternating with diarrhea.  Was told to take Imodium and stool softener as needed. -I have reviewed his labs.  ALT is slightly elevated at 45.  We will keep a close eye on it.  He will proceed with cycle 3 without any dose modifications.  2.  Tingling/numbness: -Numbness in the fingertips from C-spine problem present.  However 1 day after last treatment he said he could not feel his fingers.  This has improved subsequently.  Numbness in the toes has been stable. -Hence I will continue the oxaliplatin dose at 80%.  3.  Epigastric pain: -This is well controlled with hydrocodone 5/325 every 4 hours as needed.  4.  Liver disease: -CT scan showed mild degree of cirrhosis.  History of hep C which was untreated. -ALT is mildly elevated at 45.  We will keep a close eye on it.

## 2019-05-23 ENCOUNTER — Inpatient Hospital Stay (HOSPITAL_COMMUNITY): Payer: BC Managed Care – PPO

## 2019-05-23 ENCOUNTER — Other Ambulatory Visit: Payer: Self-pay

## 2019-05-23 VITALS — BP 115/67 | HR 70 | Temp 97.8°F | Resp 18

## 2019-05-23 DIAGNOSIS — C250 Malignant neoplasm of head of pancreas: Secondary | ICD-10-CM

## 2019-05-23 DIAGNOSIS — Z95828 Presence of other vascular implants and grafts: Secondary | ICD-10-CM

## 2019-05-23 DIAGNOSIS — C259 Malignant neoplasm of pancreas, unspecified: Secondary | ICD-10-CM | POA: Diagnosis not present

## 2019-05-23 DIAGNOSIS — Z5111 Encounter for antineoplastic chemotherapy: Secondary | ICD-10-CM | POA: Diagnosis not present

## 2019-05-23 DIAGNOSIS — Z5189 Encounter for other specified aftercare: Secondary | ICD-10-CM | POA: Diagnosis not present

## 2019-05-23 MED ORDER — PEGFILGRASTIM-CBQV 6 MG/0.6ML ~~LOC~~ SOSY
6.0000 mg | PREFILLED_SYRINGE | Freq: Once | SUBCUTANEOUS | Status: AC
  Administered 2019-05-23: 6 mg via SUBCUTANEOUS

## 2019-05-23 MED ORDER — HEPARIN SOD (PORK) LOCK FLUSH 100 UNIT/ML IV SOLN
500.0000 [IU] | Freq: Once | INTRAVENOUS | Status: AC | PRN
  Administered 2019-05-23: 500 [IU]

## 2019-05-23 MED ORDER — PEGFILGRASTIM-CBQV 6 MG/0.6ML ~~LOC~~ SOSY
PREFILLED_SYRINGE | SUBCUTANEOUS | Status: AC
  Filled 2019-05-23: qty 0.6

## 2019-05-23 MED ORDER — SODIUM CHLORIDE 0.9% FLUSH
10.0000 mL | INTRAVENOUS | Status: DC | PRN
  Administered 2019-05-23: 10 mL

## 2019-05-23 NOTE — Progress Notes (Signed)
Duane Alexander returns today for port de access and flush after 46 hr continous infusion of 79fu. Tolerated infusion without problems. Portacath located leftchest wall was  deaccessed and flushed with 38ml NS and 500U/8ml Heparin and needle removed intact.  Procedure without incident. Patient tolerated procedure well.  Udenyca injection given per order.  Patient tolerated it well without problems. Vitals stable and discharged home from clinic ambulatory. Follow up as scheduled.

## 2019-05-23 NOTE — Patient Instructions (Signed)
[facility] at [facility]  Discharge Instructions:   _______________________________________________________________  Thank you for choosing [facility] at [facility] to provide your oncology and hematology care.  To afford each patient quality time with our providers, please arrive at least 15 minutes before your scheduled appointment.  You need to re-schedule your appointment if you arrive 10 or more minutes late.  We strive to give you quality time with our providers, and arriving late affects you and other patients whose appointments are after yours.  Also, if you no show three or more times for appointments you may be dismissed from the clinic.  Again, thank you for choosing [facility] at [facility]. Our hope is that these requests will allow you access to exceptional care and in a timely manner. _______________________________________________________________  If you have questions after your visit, please contact our office at [phone] between the hours of 8:30 a.m. and 5:00 p.m. Voicemails left after 4:30 p.m. will not be returned until the following business day. _______________________________________________________________  For prescription refill requests, have your pharmacy contact our office. _______________________________________________________________  Recommendations made by the consultant and any test results will be sent to your referring physician. _______________________________________________________________ 

## 2019-05-30 ENCOUNTER — Encounter (HOSPITAL_COMMUNITY): Payer: Self-pay | Admitting: *Deleted

## 2019-05-30 ENCOUNTER — Other Ambulatory Visit (HOSPITAL_COMMUNITY): Payer: Self-pay | Admitting: *Deleted

## 2019-05-30 ENCOUNTER — Telehealth (HOSPITAL_COMMUNITY): Payer: Self-pay | Admitting: *Deleted

## 2019-05-30 DIAGNOSIS — C250 Malignant neoplasm of head of pancreas: Secondary | ICD-10-CM

## 2019-05-30 MED ORDER — HYDROCODONE-ACETAMINOPHEN 5-325 MG PO TABS: 1.0000 | ORAL_TABLET | ORAL | 0 refills | Status: DC | PRN

## 2019-06-04 ENCOUNTER — Inpatient Hospital Stay (HOSPITAL_BASED_OUTPATIENT_CLINIC_OR_DEPARTMENT_OTHER): Payer: BC Managed Care – PPO | Admitting: Hematology

## 2019-06-04 ENCOUNTER — Ambulatory Visit (HOSPITAL_COMMUNITY): Payer: BC Managed Care – PPO

## 2019-06-04 ENCOUNTER — Other Ambulatory Visit: Payer: Self-pay

## 2019-06-04 ENCOUNTER — Inpatient Hospital Stay (HOSPITAL_COMMUNITY): Payer: BC Managed Care – PPO | Attending: Hematology

## 2019-06-04 ENCOUNTER — Ambulatory Visit (HOSPITAL_COMMUNITY): Payer: BC Managed Care – PPO | Admitting: Hematology

## 2019-06-04 ENCOUNTER — Other Ambulatory Visit (HOSPITAL_COMMUNITY): Payer: BC Managed Care – PPO

## 2019-06-04 ENCOUNTER — Other Ambulatory Visit (HOSPITAL_COMMUNITY): Payer: Self-pay | Admitting: *Deleted

## 2019-06-04 ENCOUNTER — Encounter (HOSPITAL_COMMUNITY): Payer: Self-pay | Admitting: Hematology

## 2019-06-04 ENCOUNTER — Inpatient Hospital Stay (HOSPITAL_COMMUNITY): Payer: BC Managed Care – PPO

## 2019-06-04 VITALS — BP 109/69 | HR 58 | Temp 97.5°F | Resp 18 | Wt 160.9 lb

## 2019-06-04 VITALS — BP 119/70 | HR 53 | Temp 97.3°F | Resp 18

## 2019-06-04 DIAGNOSIS — K7460 Unspecified cirrhosis of liver: Secondary | ICD-10-CM | POA: Diagnosis not present

## 2019-06-04 DIAGNOSIS — C259 Malignant neoplasm of pancreas, unspecified: Secondary | ICD-10-CM | POA: Diagnosis not present

## 2019-06-04 DIAGNOSIS — C250 Malignant neoplasm of head of pancreas: Secondary | ICD-10-CM | POA: Insufficient documentation

## 2019-06-04 DIAGNOSIS — G893 Neoplasm related pain (acute) (chronic): Secondary | ICD-10-CM | POA: Diagnosis not present

## 2019-06-04 DIAGNOSIS — Z5189 Encounter for other specified aftercare: Secondary | ICD-10-CM | POA: Insufficient documentation

## 2019-06-04 DIAGNOSIS — F17210 Nicotine dependence, cigarettes, uncomplicated: Secondary | ICD-10-CM | POA: Insufficient documentation

## 2019-06-04 DIAGNOSIS — J449 Chronic obstructive pulmonary disease, unspecified: Secondary | ICD-10-CM | POA: Diagnosis not present

## 2019-06-04 DIAGNOSIS — B182 Chronic viral hepatitis C: Secondary | ICD-10-CM | POA: Diagnosis not present

## 2019-06-04 DIAGNOSIS — Z5111 Encounter for antineoplastic chemotherapy: Secondary | ICD-10-CM | POA: Diagnosis not present

## 2019-06-04 DIAGNOSIS — Z95828 Presence of other vascular implants and grafts: Secondary | ICD-10-CM

## 2019-06-04 LAB — CBC WITH DIFFERENTIAL/PLATELET
Abs Immature Granulocytes: 0.72 10*3/uL — ABNORMAL HIGH (ref 0.00–0.07)
Basophils Absolute: 0.2 10*3/uL — ABNORMAL HIGH (ref 0.0–0.1)
Basophils Relative: 1 %
Eosinophils Absolute: 0.1 10*3/uL (ref 0.0–0.5)
Eosinophils Relative: 1 %
HCT: 35.2 % — ABNORMAL LOW (ref 39.0–52.0)
Hemoglobin: 11.9 g/dL — ABNORMAL LOW (ref 13.0–17.0)
Immature Granulocytes: 4 %
Lymphocytes Relative: 16 %
Lymphs Abs: 3.2 10*3/uL (ref 0.7–4.0)
MCH: 34.4 pg — ABNORMAL HIGH (ref 26.0–34.0)
MCHC: 33.8 g/dL (ref 30.0–36.0)
MCV: 101.7 fL — ABNORMAL HIGH (ref 80.0–100.0)
Monocytes Absolute: 1.4 10*3/uL — ABNORMAL HIGH (ref 0.1–1.0)
Monocytes Relative: 7 %
Neutro Abs: 13.9 10*3/uL — ABNORMAL HIGH (ref 1.7–7.7)
Neutrophils Relative %: 71 %
Platelets: 169 10*3/uL (ref 150–400)
RBC: 3.46 MIL/uL — ABNORMAL LOW (ref 4.22–5.81)
RDW: 14.7 % (ref 11.5–15.5)
WBC: 19.5 10*3/uL — ABNORMAL HIGH (ref 4.0–10.5)
nRBC: 0.1 % (ref 0.0–0.2)

## 2019-06-04 LAB — COMPREHENSIVE METABOLIC PANEL
ALT: 49 U/L — ABNORMAL HIGH (ref 0–44)
AST: 40 U/L (ref 15–41)
Albumin: 3.4 g/dL — ABNORMAL LOW (ref 3.5–5.0)
Alkaline Phosphatase: 111 U/L (ref 38–126)
Anion gap: 8 (ref 5–15)
BUN: 14 mg/dL (ref 8–23)
CO2: 24 mmol/L (ref 22–32)
Calcium: 8.7 mg/dL — ABNORMAL LOW (ref 8.9–10.3)
Chloride: 103 mmol/L (ref 98–111)
Creatinine, Ser: 0.66 mg/dL (ref 0.61–1.24)
GFR calc Af Amer: >60 mL/min (ref >60)
GFR calc non Af Amer: >60 mL/min (ref >60)
Glucose, Bld: 123 mg/dL — ABNORMAL HIGH (ref 70–99)
Potassium: 4.3 mmol/L (ref 3.5–5.1)
Sodium: 135 mmol/L (ref 135–145)
Total Bilirubin: 0.6 mg/dL (ref 0.3–1.2)
Total Protein: 7 g/dL (ref 6.5–8.1)

## 2019-06-04 MED ORDER — SODIUM CHLORIDE 0.9% FLUSH
10.0000 mL | INTRAVENOUS | Status: DC | PRN
  Administered 2019-06-04: 10 mL

## 2019-06-04 MED ORDER — SODIUM CHLORIDE 0.9 % IV SOLN
2400.0000 mg/m2 | INTRAVENOUS | Status: DC
  Administered 2019-06-04: 4650 mg via INTRAVENOUS
  Filled 2019-06-04: qty 93

## 2019-06-04 MED ORDER — OXALIPLATIN CHEMO INJECTION 100 MG/20ML
68.0000 mg/m2 | Freq: Once | INTRAVENOUS | Status: AC
  Administered 2019-06-04: 130 mg via INTRAVENOUS
  Filled 2019-06-04: qty 20

## 2019-06-04 MED ORDER — SODIUM CHLORIDE 0.9 % IV SOLN
150.0000 mg | Freq: Once | INTRAVENOUS | Status: AC
  Administered 2019-06-04: 150 mg via INTRAVENOUS
  Filled 2019-06-04: qty 150

## 2019-06-04 MED ORDER — SODIUM CHLORIDE 0.9 % IV SOLN
150.0000 mg/m2 | Freq: Once | INTRAVENOUS | Status: AC
  Administered 2019-06-04: 280 mg via INTRAVENOUS
  Filled 2019-06-04: qty 4

## 2019-06-04 MED ORDER — PALONOSETRON HCL INJECTION 0.25 MG/5ML
0.2500 mg | Freq: Once | INTRAVENOUS | Status: AC
  Administered 2019-06-04: 0.25 mg via INTRAVENOUS
  Filled 2019-06-04: qty 5

## 2019-06-04 MED ORDER — SODIUM CHLORIDE 0.9 % IV SOLN
400.0000 mg/m2 | Freq: Once | INTRAVENOUS | Status: AC
  Administered 2019-06-04: 772 mg via INTRAVENOUS
  Filled 2019-06-04: qty 38.6

## 2019-06-04 MED ORDER — DEXTROSE 5 % IV SOLN: Freq: Once | INTRAVENOUS | Status: AC

## 2019-06-04 MED ORDER — SODIUM CHLORIDE 0.9 % IV SOLN
10.0000 mg | Freq: Once | INTRAVENOUS | Status: AC
  Administered 2019-06-04: 10 mg via INTRAVENOUS
  Filled 2019-06-04: qty 10

## 2019-06-04 MED ORDER — ATROPINE SULFATE 1 MG/ML IJ SOLN
0.5000 mg | Freq: Once | INTRAMUSCULAR | Status: AC | PRN
  Administered 2019-06-04: 0.5 mg via INTRAVENOUS
  Filled 2019-06-04: qty 1

## 2019-06-04 NOTE — Progress Notes (Signed)
Duane Alexander, [place] [address]   CLINIC:  Medical Oncology/Hematology  PCP:  Duane Hurl, PA-C [address] / [place] [address] [phone]   REASON FOR VISIT:  Follow-up for pancreatic adenocarcinoma  PRIOR THERAPY: None  CURRENT THERAPY: FOLFIRINOX Oxaliplatin 20% dose reduced.  BRIEF ONCOLOGIC HISTORY:  Oncology History  Pancreatic cancer ([place])  03/28/2019 Initial Diagnosis   Pancreatic cancer ([place])   03/28/2019 Cancer Staging   Staging form: Exocrine Pancreas, AJCC 8th Edition - Clinical stage from 03/28/2019: Stage III (cT4, cN1, cM0) - Signed by Derek Jack, MD on 03/28/2019   04/20/2019 Genetic Testing   Negative genetic testing:  No pathogenic variants detected on the [facility] Multi-Cancer panel. The report date is 04/20/2019.  The Multi-Cancer Panel offered by [facility] includes sequencing and/or deletion duplication testing of the following 85 genes: AIP, ALK, APC, ATM, AXIN2,BAP1,  BARD1, BLM, BMPR1A, BRCA1, BRCA2, BRIP1, CASR, CDC73, CDH1, CDK4, CDKN1B, CDKN1C, CDKN2A (p14ARF), CDKN2A (p16INK4a), CEBPA, CHEK2, CTNNA1, DICER1, DIS3L2, EGFR (c.2369C>T, p.Thr790Met variant only), EPCAM (Deletion/duplication testing only), FH, FLCN, GATA2, GPC3, GREM1 (Promoter region deletion/duplication testing only), HOXB13 (c.251G>A, p.Gly84Glu), HRAS, KIT, MAX, MEN1, MET, MITF (c.952G>A, p.Glu318Lys variant only), MLH1, MSH2, MSH3, MSH6, MUTYH, NBN, NF1, NF2, NTHL1, PALB2, PDGFRA, PHOX2B, PMS2, POLD1, POLE, POT1, PRKAR1A, PTCH1, PTEN, RAD50, RAD51C, RAD51D, RB1, RECQL4, RET, RNF43, RUNX1, SDHAF2, SDHA (sequence changes only), SDHB, SDHC, SDHD, SMAD4, SMARCA4, SMARCB1, SMARCE1, STK11, SUFU, TERC, TERT, TMEM127, TP53, TSC1, TSC2, VHL, WRN and WT1.   04/23/2019 -  Chemotherapy   The patient had palonosetron (ALOXI) injection 0.25 mg, 0.25 mg, Intravenous,  Once, 4 of 6 cycles Administration: 0.25 mg (04/23/2019), 0.25 mg (05/07/2019), 0.25  mg (05/21/2019) pegfilgrastim-cbqv (UDENYCA) injection 6 mg, 6 mg, Subcutaneous, Once, 4 of 6 cycles Administration: 6 mg (04/25/2019), 6 mg (05/09/2019), 6 mg (05/23/2019) irinotecan (CAMPTOSAR) 240 mg in sodium chloride 0.9 % 500 mL chemo infusion, 120 mg/m2 = 240 mg (80 % of original dose 150 mg/m2), Intravenous,  Once, 4 of 6 cycles Dose modification: 120 mg/m2 (80 % of original dose 150 mg/m2, Cycle 1, Reason: Other (see comments), Comment: cirrhosis) Administration: 240 mg (04/23/2019), 280 mg (05/07/2019), 280 mg (05/21/2019) oxaliplatin (ELOXATIN) 130 mg in dextrose 5 % 500 mL chemo infusion, 68 mg/m2 = 130 mg (80 % of original dose 85 mg/m2), Intravenous,  Once, 4 of 6 cycles Dose modification: 68 mg/m2 (80 % of original dose 85 mg/m2, Cycle 1, Reason: Other (see comments), Comment: cirrhosis), 68 mg/m2 (80 % of original dose 85 mg/m2, Cycle 2, Reason: Provider Judgment), 68 mg/m2 (80 % of original dose 85 mg/m2, Cycle 3, Reason: Other (see comments), Comment: neuropathy) Administration: 130 mg (04/23/2019), 130 mg (05/07/2019), 130 mg (05/21/2019) fosaprepitant (EMEND) 150 mg in sodium chloride 0.9 % 145 mL IVPB, 150 mg, Intravenous,  Once, 4 of 6 cycles Administration: 150 mg (04/23/2019), 150 mg (05/07/2019), 150 mg (05/21/2019) fluorouracil (ADRUCIL) 3,700 mg in sodium chloride 0.9 % 76 mL chemo infusion, 1,920 mg/m2 = 3,700 mg (80 % of original dose 2,400 mg/m2), Intravenous, 1 Day/Dose, 4 of 6 cycles Dose modification: 1,920 mg/m2 (80 % of original dose 2,400 mg/m2, Cycle 1, Reason: Other (see comments), Comment: cirrhosis) Administration: 3,700 mg (04/23/2019), 4,650 mg (05/07/2019), 4,650 mg (05/21/2019) leucovorin 618 mg in sodium chloride 0.9 % 250 mL infusion, 320 mg/m2 = 618 mg (80 % of original dose 400 mg/m2), Intravenous,  Once, 4 of 6 cycles Dose modification: 320 mg/m2 (80 % of  original dose 400 mg/m2, Cycle 1, Reason: Other (see comments), Comment: cirrhosis) Administration: 618 mg (04/23/2019),  772 mg (05/07/2019), 772 mg (05/21/2019)  for chemotherapy treatment.      CANCER STAGING: Cancer Staging Pancreatic cancer [facility]) Staging form: Exocrine Pancreas, AJCC 8th Edition - Clinical stage from 03/28/2019: Stage III (cT4, cN1, cM0) - Signed by Derek Jack, MD on 03/28/2019   INTERVAL HISTORY:  [name], a 64 y.o. male, returns for routine follow-up and consideration for next cycle of chemotherapy. Duane Alexander was last seen on 05/21/2019.  Due for cycle #4 of FOLFIRINOX today.   Overall, he tells me he has been feeling pretty well. He denies any issues with the last treatment. His appetite is good. His pain has improved since starting treatment. He got his second Moderna COVID-19 vaccine last week. He states that it "knocked him down" for a day or so, but he recovered quickly.   Overall, he feels ready for next cycle of chemo today.      REVIEW OF SYSTEMS:  Review of Systems  Constitutional: Positive for unexpected weight change (Down 3 lbs). Negative for appetite change and fatigue.  Gastrointestinal: Positive for abdominal pain (With Constipation), constipation (Treated w/ Stool Softener) and diarrhea (following Tx).  Psychiatric/Behavioral: Positive for sleep disturbance.  All other systems reviewed and are negative.   PAST MEDICAL/SURGICAL HISTORY:  Past Medical History:  Diagnosis Date  . Aortic atherosclerosis ([place])   . COPD (chronic obstructive pulmonary disease) ([place])    per CT findings 2019  . Family history of brain cancer   . Family history of colon cancer   . Family history of kidney cancer   . Family history of leukemia   . Family history of lung cancer   . Family history of ovarian cancer   . Family history of prostate cancer   . Family history of stomach cancer   . GERD (gastroesophageal reflux disease)   . Hepatitis C    referred to hepatitis clinic 2019 but he never went  . Hyperlipidemia   . Hypertension   . Port-A-Cath in place  04/17/2019  . Smoker   . Wears glasses    Past Surgical History:  Procedure Laterality Date  . ANTERIOR CERVICAL DECOMP/DISCECTOMY FUSION  12/2017   Dr. Consuella Lose  . BIOPSY  03/11/2019   Procedure: BIOPSY;  Surgeon: [name]., MD;  Location: [facility];  Service: Gastroenterology;;  . COLONOSCOPY N/A 01/19/2017   Procedure: COLONOSCOPY;  Surgeon: Rogene Houston, MD;  [facility];  Service: Endoscopy;  Laterality: N/A;  930-moved to 9:45 per Lelon Frohlich  . ESOPHAGOGASTRODUODENOSCOPY (EGD) WITH PROPOFOL N/A 03/11/2019   Procedure: ESOPHAGOGASTRODUODENOSCOPY (EGD) WITH PROPOFOL;  Surgeon: Rush Landmark Telford Nab., MD;  Location: [facility] ENDOSCOPY;  Service: Gastroenterology;  Laterality: N/A;  . EUS N/A 03/11/2019   Procedure: UPPER ENDOSCOPIC ULTRASOUND (EUS) RADIAL;  Surgeon: Rush Landmark Telford Nab., MD;  Location: [facility];  Service: Gastroenterology;  Laterality: N/A;  . FINE NEEDLE ASPIRATION  03/11/2019   Procedure: FINE NEEDLE ASPIRATION (FNA) LINEAR;  Surgeon: [name]., MD;  Location: [facility];  Service: Gastroenterology;;  . POLYPECTOMY  01/19/2017   Procedure: POLYPECTOMY;  Surgeon: Rogene Houston, MD;  [facility];  Service: Endoscopy;;  splenic flexure  . POLYPECTOMY  03/11/2019   Procedure: POLYPECTOMY;  Surgeon: Mansouraty, Telford Nab., MD;  Location: [facility];  Service: Gastroenterology;;  . Sol Passer PLACEMENT Left 04/15/2019   Procedure: INSERTION PORT-A-CATH;  Surgeon: Aviva Signs, MD;  Location: [facility];  Service: General;  Laterality: Left;  . Right eye surgery     as a child-removed muscle from leg and put in upper eye lid    SOCIAL HISTORY:  Social History   Socioeconomic History  . Marital status: Widowed    Spouse name: Not on file  . Number of children: Not on file  . Years of education: Not on file  . Highest education level: Not on file  Occupational History    Employer: [employer]  Tobacco Use    . Smoking status: Current Every Day Smoker    Packs/day: 0.50    Years: 45.00    Pack years: 22.50    Types: Cigarettes  . Smokeless tobacco: Never Used  Substance and Sexual Activity  . Alcohol use: Not Currently  . Drug use: No  . Sexual activity: Not Currently  Other Topics Concern  . Not on file  Social History Narrative  . Not on file   Social Determinants of Health   Financial Resource Strain: Low Risk   . Difficulty of Paying Living Expenses: Not hard at all  Food Insecurity: No Food Insecurity  . Worried About Charity fundraiser in the Last Year: Never true  . Ran Out of Food in the Last Year: Never true  Transportation Needs: No Transportation Needs  . Lack of Transportation (Medical): No  . Lack of Transportation (Non-Medical): No  Physical Activity: Inactive  . Days of Exercise per Week: 0 days  . Minutes of Exercise per Session: 0 min  Stress: Stress Concern Present  . Feeling of Stress : Rather much  Social Connections: Moderately Isolated  . Frequency of Communication with Friends and Family: Three times a week  . Frequency of Social Gatherings with Friends and Family: Three times a week  . Attends Religious Services: Never  . Active Member of Clubs or Organizations: No  . Attends Archivist Meetings: Never  . Marital Status: Widowed  Intimate Partner Violence: Not At Risk  . Fear of Current or Ex-Partner: No  . Emotionally Abused: No  . Physically Abused: No  . Sexually Abused: No    FAMILY HISTORY:  Family History  Problem Relation Age of Onset  . Throat cancer Father 36       died in his 14s of stomach issues?  . Lung cancer Brother 84       smoker, non-small cell  . Ovarian cancer Sister 19       GI related death  . Heart attack Maternal Grandmother   . Brain cancer Maternal Aunt        dx. in her 74s  . Lung cancer Maternal Uncle        dx. in his 69s-60s, smoker  . Stomach cancer Maternal Aunt        dx. in her 7s  . Colon  cancer Cousin        dx. >50 - maternal cousin  . Kidney cancer Cousin        dx. in his 59s - maternal cousin  . Prostate cancer Cousin        dx. in his 14s - maternal cousin  . Leukemia Nephew 34  . Heart disease Neg Hx   . Hypertension Neg Hx   . Stroke Neg Hx     CURRENT MEDICATIONS:  Current Outpatient Medications  Medication Sig Dispense Refill  . docusate sodium (COLACE) 100 MG capsule Take 100 mg by mouth  daily.    . fluorouracil CALGB 38453 in sodium chloride 0.9 % 150 mL Inject 2,400 mg/m2 into the vein over 48 hr.    . FLUOROURACIL IV Inject into the vein every 21 ( twenty-one) days.    . IRINOTECAN HCL IV Inject into the vein every 14 (fourteen) days.    [name] LEUCOVORIN CALCIUM IV Inject into the vein every 14 (fourteen) days.    [name] lisinopril (ZESTRIL) 20 MG tablet TAKE 1 TABLET BY MOUTH DAILY 30 tablet 0  . lovastatin (MEVACOR) 20 MG tablet TAKE 1 TABLET BY MOUTH AT BEDTIME 30 tablet 0  . melatonin 5 MG TABS Take 5 mg by mouth at bedtime.    . Multiple Vitamin (MULTIVITAMIN) tablet Take 1 tablet by mouth daily.    . OXALIPLATIN IV Inject into the vein every 14 (fourteen) days.    [name] HYDROcodone-acetaminophen (NORCO/VICODIN) 5-325 MG tablet Take 1 tablet by mouth every 4 (four) hours as needed for moderate pain. (Patient not taking: Reported on 06/04/2019) 84 tablet 0  . lidocaine-prilocaine (EMLA) cream Apply a small amount to port a cath site and cover with plastic wrap one hour prior to chemotherapy appointments (Patient not taking: Reported on 06/04/2019) 30 g 3  . loperamide (IMODIUM A-D) 2 MG tablet Take 2 at onset of diarrhea, then 1 tab after every watery bowel movement - DO NOT EXCEED 8 tablets in a 24 hour period (Patient not taking: Reported on 06/04/2019) 100 tablet 1  . prochlorperazine (COMPAZINE) 10 MG tablet Take 1 tablet (10 mg total) by mouth every 6 (six) hours as needed (Nausea or vomiting). (Patient not taking: Reported on 06/04/2019) 60 tablet 3   No current  facility-administered medications for this visit.   Facility-Administered Medications Ordered in Other Visits  Medication Dose Route Frequency Provider Last Rate Last Admin  . atropine injection 0.5 mg  0.5 mg Intravenous Once PRN Derek Jack, MD      . dexamethasone (DECADRON) 10 mg in sodium chloride 0.9 % 50 mL IVPB  10 mg Intravenous Once Derek Jack, MD      . fosaprepitant (EMEND) 150 mg in sodium chloride 0.9 % 145 mL IVPB  150 mg Intravenous Once Derek Jack, MD      . palonosetron (ALOXI) injection 0.25 mg  0.25 mg Intravenous Once Derek Jack, MD      . sodium chloride flush (NS) 0.9 % injection 10 mL  10 mL Intracatheter PRN Derek Jack, MD   10 mL at 06/04/19 0805    ALLERGIES:  No Known Allergies  PHYSICAL EXAM:  Performance status (ECOG): 1 - Symptomatic but completely ambulatory  Vitals:   06/04/19 0800  BP: 109/69  Pulse: (!) 58  Resp: 18  Temp: (!) 97.5 F (36.4 C)  SpO2: 100%   Wt Readings from Last 3 Encounters:  06/04/19 160 lb 14.4 oz (73 kg)  05/21/19 163 lb 11.2 oz (74.3 kg)  05/07/19 167 lb (75.8 kg)   Physical Exam Vitals reviewed.  Constitutional:      Appearance: Normal appearance.  Cardiovascular:     Rate and Rhythm: Normal rate and regular rhythm.     Heart sounds: Normal heart sounds.  Pulmonary:     Effort: Pulmonary effort is normal.     Breath sounds: Normal breath sounds.  Abdominal:     Tenderness: There is abdominal tenderness.  Skin:    General: Skin is warm.  Neurological:     General: No focal deficit present.  Mental Status: He is alert and oriented to person, place, and time.  Psychiatric:        Mood and Affect: Mood normal.        Behavior: Behavior normal.     LABORATORY DATA:  I have reviewed the labs as listed.  CBC Latest Ref Rng & Units 06/04/2019 05/21/2019 05/07/2019  WBC 4.0 - 10.5 K/uL 19.5(H) 17.3(H) 18.3(H)  Hemoglobin 13.0 - 17.0 g/dL 11.9(L) 12.2(L) 12.8(L)    Hematocrit 39.0 - 52.0 % 35.2(L) 36.2(L) 39.0  Platelets 150 - 400 K/uL 169 189 169   CMP Latest Ref Rng & Units 06/04/2019 05/21/2019 05/07/2019  Glucose 70 - 99 mg/dL 123(H) 215(H) 227(H)  BUN 8 - 23 mg/dL _0 Creatinine 0.61 - 1.24 mg/dL 0.66 0.57(L) 0.76  Sodium 135 - 145 mmol/L 135 138 137  Potassium 3.5 - 5.1 mmol/L 4.3 3.8 4.1  Chloride 98 - 111 mmol/L 103 104 103  CO2 22 - 32 mmol/L _1 Calcium 8.9 - 10.3 mg/dL 8.7(L) 8.7(L) 8.9  Total Protein 6.5 - 8.1 g/dL 7.0 6.5 6.9  Total Bilirubin 0.3 - 1.2 mg/dL 0.6 0.4 0.4  Alkaline Phos 38 - 126 U/L 111 88 90  AST 15 - 41 U/L 40 31 31  ALT 0 - 44 U/L 49(H) 45(H) 39    DIAGNOSTIC IMAGING:  I have independently reviewed the scans and discussed with the patient.   ASSESSMENT:  1.  Pancreatic adenocarcinoma (UT 4 N1 MX): -PET scan on 03/28/2019 did not show any evidence of metastatic disease. -CA 19-9 was 40 on 02/23/2019. -Germline mutation testing was negative. -FOLFIRINOX started on 04/23/2019.  2.  Epigastric pain: -This is fairly improved since the start of treatments. -He is using hydrocodone 5/325 as needed.  Some days he does not require any pain medication.  3.  Liver disease: -CT scan showed mild degree of cirrhosis.  History of hepatitis C which was untreated.   PLAN:  1.  Pancreatic adenocarcinoma: -I have reviewed his labs.  LFTs show elevated ALT.  Bilirubin was normal.  White count is normal. -Proceed with cycle 4 today.  Irinotecan was full dose. -We will reevaluate him in 2 weeks.  2.  Tingling/numbness: -Numbness in the fingertips from C-spine problem present.  Disc problem with numbness in the toes is also stable. -Oxaliplatin dosed at 80%.  3.  Epigastric pain: -Continue hydrocodone 5/325 as needed.  There is tenderness on examination without mass.    Orders placed this encounter:  No orders of the defined types were placed in this encounter.    Derek Jack, MD [facility] [phone]   I, Jacqualyn Posey, am acting as a scribe for Dr. Sanda Linger.  I, Derek Jack MD, have reviewed the above documentation for accuracy and completeness, and I agree with the above.

## 2019-06-04 NOTE — Patient Instructions (Signed)
[place] at [facility] Discharge Instructions  You were seen today by Dr. Delton Coombes. He went over your recent lab results. Please remember to not take Tylenol. He will see you back in 2 weeks for labs and follow up.   Thank you for choosing [place] at [facility] to provide your oncology and hematology care.  To afford each patient quality time with our provider, please arrive at least 15 minutes before your scheduled appointment time.   If you have a lab appointment with the [facility] please come in thru the  Main Entrance and check in at the main information desk  You need to re-schedule your appointment should you arrive 10 or more minutes late.  We strive to give you quality time with our providers, and arriving late affects you and other patients whose appointments are after yours.  Also, if you no show three or more times for appointments you may be dismissed from the clinic at the providers discretion.     Again, thank you for choosing [facility].  Our hope is that these requests will decrease the amount of time that you wait before being seen by our physicians.       _____________________________________________________________  Should you have questions after your visit to [facility], please contact our office at [phone] between the hours of 8:00 a.m. and 4:30 p.m.  Voicemails left after 4:00 p.m. will not be returned until the following business day.  For prescription refill requests, have your pharmacy contact our office and allow 72 hours.    Cancer Center Support Programs:   > Cancer Support Group  2nd Tuesday of the month 1pm-2pm, Journey Room

## 2019-06-04 NOTE — Progress Notes (Signed)
Patient seen by Dr. Delton Coombes with lab review and vital signs.  Ok to treat today verbal order with no changes in treatment plan verbal order Dr. Delton Coombes.   Patient tolerated chemotherapy with no complaints voiced.  Side effects with management reviewed with understanding verbalized.  Port site clean and dry with no bruising or swelling noted at site.  Good blood return noted before and after administration of chemotherapy.  Chemo pump connected with no alarms noted.  Patient left ambulatory with VSS and no s/s of distress noted.

## 2019-06-04 NOTE — Patient Instructions (Signed)
[facility] at [facility] Discharge Instructions  Labs drawn from portacath today   Thank you for choosing [facility] at [facility] to provide your oncology and hematology care.  To afford each patient quality time with our provider, please arrive at least 15 minutes before your scheduled appointment time.   If you have a lab appointment with the Cancer Center please come in thru the Main Entrance and check in at the main information desk.  You need to re-schedule your appointment should you arrive 10 or more minutes late.  We strive to give you quality time with our providers, and arriving late affects you and other patients whose appointments are after yours.  Also, if you no show three or more times for appointments you may be dismissed from the clinic at the providers discretion.     Again, thank you for choosing [facility].  Our hope is that these requests will decrease the amount of time that you wait before being seen by our physicians.       _____________________________________________________________  Should you have questions after your visit to [facility], please contact our office at [phone] between the hours of 8:00 a.m. and 4:30 p.m.  Voicemails left after 4:00 p.m. will not be returned until the following business day.  For prescription refill requests, have your pharmacy contact our office and allow 72 hours.    Due to Covid, you will need to wear a mask upon entering the hospital. If you do not have a mask, a mask will be given to you at the Main Entrance upon arrival. For doctor visits, patients may have 1 support person with them. For treatment visits, patients can not have anyone with them due to social distancing guidelines and our immunocompromised population.     

## 2019-06-06 ENCOUNTER — Encounter (HOSPITAL_COMMUNITY): Payer: BC Managed Care – PPO

## 2019-06-06 ENCOUNTER — Other Ambulatory Visit: Payer: Self-pay

## 2019-06-06 ENCOUNTER — Inpatient Hospital Stay (HOSPITAL_COMMUNITY): Payer: BC Managed Care – PPO

## 2019-06-06 VITALS — BP 117/68 | HR 55 | Temp 98.6°F | Resp 18

## 2019-06-06 DIAGNOSIS — K7460 Unspecified cirrhosis of liver: Secondary | ICD-10-CM | POA: Diagnosis not present

## 2019-06-06 DIAGNOSIS — B182 Chronic viral hepatitis C: Secondary | ICD-10-CM | POA: Diagnosis not present

## 2019-06-06 DIAGNOSIS — C250 Malignant neoplasm of head of pancreas: Secondary | ICD-10-CM

## 2019-06-06 DIAGNOSIS — Z95828 Presence of other vascular implants and grafts: Secondary | ICD-10-CM

## 2019-06-06 DIAGNOSIS — J449 Chronic obstructive pulmonary disease, unspecified: Secondary | ICD-10-CM | POA: Diagnosis not present

## 2019-06-06 DIAGNOSIS — G893 Neoplasm related pain (acute) (chronic): Secondary | ICD-10-CM | POA: Diagnosis not present

## 2019-06-06 DIAGNOSIS — Z5189 Encounter for other specified aftercare: Secondary | ICD-10-CM | POA: Diagnosis not present

## 2019-06-06 DIAGNOSIS — F17210 Nicotine dependence, cigarettes, uncomplicated: Secondary | ICD-10-CM | POA: Diagnosis not present

## 2019-06-06 DIAGNOSIS — Z5111 Encounter for antineoplastic chemotherapy: Secondary | ICD-10-CM | POA: Diagnosis not present

## 2019-06-06 MED ORDER — PEGFILGRASTIM-CBQV 6 MG/0.6ML ~~LOC~~ SOSY
6.0000 mg | PREFILLED_SYRINGE | Freq: Once | SUBCUTANEOUS | Status: AC
  Administered 2019-06-06: 6 mg via SUBCUTANEOUS
  Filled 2019-06-06: qty 0.6

## 2019-06-06 MED ORDER — SODIUM CHLORIDE 0.9% FLUSH
10.0000 mL | INTRAVENOUS | Status: DC | PRN
  Administered 2019-06-06: 10 mL

## 2019-06-06 MED ORDER — HEPARIN SOD (PORK) LOCK FLUSH 100 UNIT/ML IV SOLN
500.0000 [IU] | Freq: Once | INTRAVENOUS | Status: AC | PRN
  Administered 2019-06-06: 500 [IU]

## 2019-06-13 ENCOUNTER — Encounter (HOSPITAL_COMMUNITY): Payer: Self-pay | Admitting: *Deleted

## 2019-06-13 ENCOUNTER — Other Ambulatory Visit (HOSPITAL_COMMUNITY): Payer: Self-pay | Admitting: *Deleted

## 2019-06-13 DIAGNOSIS — C250 Malignant neoplasm of head of pancreas: Secondary | ICD-10-CM

## 2019-06-13 MED ORDER — HYDROCODONE-ACETAMINOPHEN 5-325 MG PO TABS: 1.0000 | ORAL_TABLET | ORAL | 0 refills | Status: DC | PRN

## 2019-06-18 ENCOUNTER — Inpatient Hospital Stay (HOSPITAL_BASED_OUTPATIENT_CLINIC_OR_DEPARTMENT_OTHER): Payer: BC Managed Care – PPO | Admitting: Hematology

## 2019-06-18 ENCOUNTER — Inpatient Hospital Stay (HOSPITAL_COMMUNITY): Payer: BC Managed Care – PPO

## 2019-06-18 VITALS — BP 114/63 | HR 58 | Temp 97.9°F | Resp 18 | Wt 160.1 lb

## 2019-06-18 VITALS — BP 114/65 | HR 65 | Temp 98.0°F | Resp 18

## 2019-06-18 DIAGNOSIS — G629 Polyneuropathy, unspecified: Secondary | ICD-10-CM | POA: Diagnosis not present

## 2019-06-18 DIAGNOSIS — Z95828 Presence of other vascular implants and grafts: Secondary | ICD-10-CM

## 2019-06-18 DIAGNOSIS — F17210 Nicotine dependence, cigarettes, uncomplicated: Secondary | ICD-10-CM | POA: Diagnosis not present

## 2019-06-18 DIAGNOSIS — Z5189 Encounter for other specified aftercare: Secondary | ICD-10-CM | POA: Diagnosis not present

## 2019-06-18 DIAGNOSIS — C250 Malignant neoplasm of head of pancreas: Secondary | ICD-10-CM | POA: Diagnosis not present

## 2019-06-18 DIAGNOSIS — C259 Malignant neoplasm of pancreas, unspecified: Secondary | ICD-10-CM | POA: Diagnosis not present

## 2019-06-18 DIAGNOSIS — B182 Chronic viral hepatitis C: Secondary | ICD-10-CM | POA: Diagnosis not present

## 2019-06-18 DIAGNOSIS — G893 Neoplasm related pain (acute) (chronic): Secondary | ICD-10-CM | POA: Diagnosis not present

## 2019-06-18 DIAGNOSIS — J449 Chronic obstructive pulmonary disease, unspecified: Secondary | ICD-10-CM | POA: Diagnosis not present

## 2019-06-18 DIAGNOSIS — Z5111 Encounter for antineoplastic chemotherapy: Secondary | ICD-10-CM | POA: Diagnosis not present

## 2019-06-18 DIAGNOSIS — K7460 Unspecified cirrhosis of liver: Secondary | ICD-10-CM | POA: Diagnosis not present

## 2019-06-18 LAB — COMPREHENSIVE METABOLIC PANEL
ALT: 43 U/L (ref 0–44)
AST: 35 U/L (ref 15–41)
Albumin: 3.4 g/dL — ABNORMAL LOW (ref 3.5–5.0)
Alkaline Phosphatase: 115 U/L (ref 38–126)
Anion gap: 7 (ref 5–15)
BUN: 17 mg/dL (ref 8–23)
CO2: 24 mmol/L (ref 22–32)
Calcium: 8.6 mg/dL — ABNORMAL LOW (ref 8.9–10.3)
Chloride: 106 mmol/L (ref 98–111)
Creatinine, Ser: 0.67 mg/dL (ref 0.61–1.24)
GFR calc Af Amer: >60 mL/min (ref >60)
GFR calc non Af Amer: >60 mL/min (ref >60)
Glucose, Bld: 109 mg/dL — ABNORMAL HIGH (ref 70–99)
Potassium: 4 mmol/L (ref 3.5–5.1)
Sodium: 137 mmol/L (ref 135–145)
Total Bilirubin: 0.6 mg/dL (ref 0.3–1.2)
Total Protein: 6.9 g/dL (ref 6.5–8.1)

## 2019-06-18 LAB — CBC WITH DIFFERENTIAL/PLATELET
Abs Immature Granulocytes: 0.42 10*3/uL — ABNORMAL HIGH (ref 0.00–0.07)
Basophils Absolute: 0.1 10*3/uL (ref 0.0–0.1)
Basophils Relative: 1 %
Eosinophils Absolute: 0.1 10*3/uL (ref 0.0–0.5)
Eosinophils Relative: 1 %
HCT: 33.8 % — ABNORMAL LOW (ref 39.0–52.0)
Hemoglobin: 11.2 g/dL — ABNORMAL LOW (ref 13.0–17.0)
Immature Granulocytes: 3 %
Lymphocytes Relative: 19 %
Lymphs Abs: 3.1 10*3/uL (ref 0.7–4.0)
MCH: 34 pg (ref 26.0–34.0)
MCHC: 33.1 g/dL (ref 30.0–36.0)
MCV: 102.7 fL — ABNORMAL HIGH (ref 80.0–100.0)
Monocytes Absolute: 1.2 10*3/uL — ABNORMAL HIGH (ref 0.1–1.0)
Monocytes Relative: 8 %
Neutro Abs: 11.4 10*3/uL — ABNORMAL HIGH (ref 1.7–7.7)
Neutrophils Relative %: 68 %
Platelets: 155 10*3/uL (ref 150–400)
RBC: 3.29 MIL/uL — ABNORMAL LOW (ref 4.22–5.81)
RDW: 15.7 % — ABNORMAL HIGH (ref 11.5–15.5)
WBC: 16.5 10*3/uL — ABNORMAL HIGH (ref 4.0–10.5)
nRBC: 0.1 % (ref 0.0–0.2)

## 2019-06-18 MED ORDER — SODIUM CHLORIDE 0.9 % IV SOLN
150.0000 mg/m2 | Freq: Once | INTRAVENOUS | Status: AC
  Administered 2019-06-18: 280 mg via INTRAVENOUS
  Filled 2019-06-18: qty 10

## 2019-06-18 MED ORDER — SODIUM CHLORIDE 0.9 % IV SOLN
10.0000 mg | Freq: Once | INTRAVENOUS | Status: AC
  Administered 2019-06-18: 10 mg via INTRAVENOUS
  Filled 2019-06-18: qty 10

## 2019-06-18 MED ORDER — SODIUM CHLORIDE 0.9 % IV SOLN
150.0000 mg | Freq: Once | INTRAVENOUS | Status: AC
  Administered 2019-06-18: 150 mg via INTRAVENOUS
  Filled 2019-06-18: qty 150

## 2019-06-18 MED ORDER — SODIUM CHLORIDE 0.9 % IV SOLN
2400.0000 mg/m2 | INTRAVENOUS | Status: DC
  Administered 2019-06-18: 4650 mg via INTRAVENOUS
  Filled 2019-06-18: qty 93

## 2019-06-18 MED ORDER — DEXTROSE 5 % IV SOLN: Freq: Once | INTRAVENOUS | Status: AC

## 2019-06-18 MED ORDER — OXALIPLATIN CHEMO INJECTION 100 MG/20ML
53.0000 mg/m2 | Freq: Once | INTRAVENOUS | Status: AC
  Administered 2019-06-18: 100 mg via INTRAVENOUS
  Filled 2019-06-18: qty 20

## 2019-06-18 MED ORDER — DULOXETINE HCL 30 MG PO CPEP: 30.0000 mg | ORAL_CAPSULE | Freq: Every day | ORAL | 0 refills | Status: DC

## 2019-06-18 MED ORDER — SODIUM CHLORIDE 0.9 % IV SOLN
400.0000 mg/m2 | Freq: Once | INTRAVENOUS | Status: AC
  Administered 2019-06-18: 772 mg via INTRAVENOUS
  Filled 2019-06-18: qty 38.6

## 2019-06-18 MED ORDER — SODIUM CHLORIDE 0.9% FLUSH
10.0000 mL | INTRAVENOUS | Status: DC | PRN
  Administered 2019-06-18: 10 mL

## 2019-06-18 MED ORDER — PALONOSETRON HCL INJECTION 0.25 MG/5ML
0.2500 mg | Freq: Once | INTRAVENOUS | Status: AC
  Administered 2019-06-18: 0.25 mg via INTRAVENOUS
  Filled 2019-06-18: qty 5

## 2019-06-18 MED ORDER — ATROPINE SULFATE 1 MG/ML IJ SOLN
0.5000 mg | Freq: Once | INTRAMUSCULAR | Status: AC | PRN
  Administered 2019-06-18: 0.5 mg via INTRAVENOUS
  Filled 2019-06-18: qty 1

## 2019-06-18 NOTE — Patient Instructions (Signed)
[facility] at [facility] Discharge Instructions  Labs drawn from portacath today   Thank you for choosing [facility] at [facility] to provide your oncology and hematology care.  To afford each patient quality time with our provider, please arrive at least 15 minutes before your scheduled appointment time.   If you have a lab appointment with the Cancer Center please come in thru the Main Entrance and check in at the main information desk.  You need to re-schedule your appointment should you arrive 10 or more minutes late.  We strive to give you quality time with our providers, and arriving late affects you and other patients whose appointments are after yours.  Also, if you no show three or more times for appointments you may be dismissed from the clinic at the providers discretion.     Again, thank you for choosing [facility].  Our hope is that these requests will decrease the amount of time that you wait before being seen by our physicians.       _____________________________________________________________  Should you have questions after your visit to [facility], please contact our office at [phone] between the hours of 8:00 a.m. and 4:30 p.m.  Voicemails left after 4:00 p.m. will not be returned until the following business day.  For prescription refill requests, have your pharmacy contact our office and allow 72 hours.    Due to Covid, you will need to wear a mask upon entering the hospital. If you do not have a mask, a mask will be given to you at the Main Entrance upon arrival. For doctor visits, patients may have 1 support person with them. For treatment visits, patients can not have anyone with them due to social distancing guidelines and our immunocompromised population.     

## 2019-06-18 NOTE — Progress Notes (Signed)
Patient has been assessed, vital signs and labs have been reviewed by Dr. Delton Coombes. ANC, Creatinine, LFTs, and Platelets are within treatment parameters per Dr. Delton Coombes. The patient is good to proceed with treatment at this time. Dose reducing Oxaliplatin per Dr. Delton Coombes.

## 2019-06-18 NOTE — Patient Instructions (Addendum)
[place] at [facility] Discharge Instructions  You were seen today by Dr. Delton Coombes. He went over your recent results. Please continue monitoring for further side effects of the chemotherapy. You will be prescribed Cymbalta for your numbness; take 30 mg at bedtime. You will be seen by the NP or PA in 2 weeks for labs and follow up.   Thank you for choosing [place] at [facility] to provide your oncology and hematology care.  To afford each patient quality time with our provider, please arrive at least 15 minutes before your scheduled appointment time.   If you have a lab appointment with the [facility] please come in thru the Main Entrance and check in at the main information desk  You need to re-schedule your appointment should you arrive 10 or more minutes late.  We strive to give you quality time with our providers, and arriving late affects you and other patients whose appointments are after yours.  Also, if you no show three or more times for appointments you may be dismissed from the clinic at the providers discretion.     Again, thank you for choosing [facility].  Our hope is that these requests will decrease the amount of time that you wait before being seen by our physicians.       _____________________________________________________________  Should you have questions after your visit to [facility], please contact our office at [phone] between the hours of 8:00 a.m. and 4:30 p.m.  Voicemails left after 4:00 p.m. will not be returned until the following business day.  For prescription refill requests, have your pharmacy contact our office and allow 72 hours.    Cancer Center Support Programs:   > Cancer Support Group  2nd Tuesday of the month 1pm-2pm, Journey Room

## 2019-06-18 NOTE — Progress Notes (Signed)
[facility] reviewed with and pt seen by Dr. Delton Coombes and pt approved for FOLFIRINOX tx today with dose reduction in Oxaliplatin per MD                                                                            Duane Alexander tolerated chemo tx well without complaints or incident. Pt discharged with 5FU pump infusing without issues. VSS upon discharge. Pt discharged self ambulatory in satisfactory condition accompanied by family member

## 2019-06-18 NOTE — Patient Instructions (Signed)
[facility] Discharge Instructions for Patients Receiving Chemotherapy   Beginning January 23rd 2017 lab work for the [facility] will be done in the  Main lab at [facility] on 1st floor. If you have a lab appointment with the [facility] please come in thru the  Main Entrance and check in at the main information desk   Today you received the following chemotherapy agents Oxaliplatin,Irinotecan,Leucovorin and 5FU. Follow-up as scheduled. Call clinic for any questions or concerns  To help prevent nausea and vomiting after your treatment, we encourage you to take your nausea medication   If you develop nausea and vomiting, or diarrhea that is not controlled by your medication, call the clinic.  The clinic phone number is [phone]. Office hours are Monday-Friday 8:30am-5:00pm.  BELOW ARE SYMPTOMS THAT SHOULD BE REPORTED IMMEDIATELY:  *FEVER GREATER THAN 101.0 F  *CHILLS WITH OR WITHOUT FEVER  NAUSEA AND VOMITING THAT IS NOT CONTROLLED WITH YOUR NAUSEA MEDICATION  *UNUSUAL SHORTNESS OF BREATH  *UNUSUAL BRUISING OR BLEEDING  TENDERNESS IN MOUTH AND THROAT WITH OR WITHOUT PRESENCE OF ULCERS  *URINARY PROBLEMS  *BOWEL PROBLEMS  UNUSUAL RASH Items with * indicate a potential emergency and should be followed up as soon as possible. If you have an emergency after office hours please contact your primary care physician or go to the nearest emergency department.  Please call the clinic during office hours if you have any questions or concerns.   You may also contact the Patient Navigator at [phone] should you have any questions or need assistance in obtaining follow up care.      Resources For Cancer Patients and their Caregivers ? American Cancer Society: Can assist with transportation, wigs, general needs, runs Look Good Feel Better.        9515675067 ? Cancer Care: Provides financial assistance, online support groups, medication/co-pay  assistance.  1-800-813-HOPE 281-759-7949) ? [place] Assists [place] cancer patients and their families through emotional , educational and financial support.  365-321-2255 ? [place] DSS Where to apply for food stamps, Medicaid and utility assistance. (410)458-9541 ? [facility]: Transportation to medical appointments. (248) 880-5627 ? Social Security Administration: May apply for disability if have a Stage IV cancer. (864) 327-1882 (763) 132-2920 ? [employer], Disability and Transit Services: Assists with nutrition, care and transit needs. 608-500-3962

## 2019-06-18 NOTE — Progress Notes (Signed)
Duane Alexander, [place] [address]   CLINIC:  Medical Oncology/Hematology  PCP:  Carlena Hurl, PA-C [address] / [place]  [phone]   REASON FOR VISIT:  Follow-up for pancreatic cancer  PRIOR THERAPY: None  CURRENT THERAPY: FOLFIRINOX & Aloxi  BRIEF ONCOLOGIC HISTORY:  Oncology History  Pancreatic cancer ([place])  03/28/2019 Initial Diagnosis   Pancreatic cancer ([place])   03/28/2019 Cancer Staging   Staging form: Exocrine Pancreas, AJCC 8th Edition - Clinical stage from 03/28/2019: Stage III (cT4, cN1, cM0) - Signed by Derek Jack, MD on 03/28/2019   04/20/2019 Genetic Testing   Negative genetic testing:  No pathogenic variants detected on the [facility] Multi-Cancer panel. The report date is 04/20/2019.  The Multi-Cancer Panel offered by [facility] includes sequencing and/or deletion duplication testing of the following 85 genes: AIP, ALK, APC, ATM, AXIN2,BAP1,  BARD1, BLM, BMPR1A, BRCA1, BRCA2, BRIP1, CASR, CDC73, CDH1, CDK4, CDKN1B, CDKN1C, CDKN2A (p14ARF), CDKN2A (p16INK4a), CEBPA, CHEK2, CTNNA1, DICER1, DIS3L2, EGFR (c.2369C>T, p.Thr790Met variant only), EPCAM (Deletion/duplication testing only), FH, FLCN, GATA2, GPC3, GREM1 (Promoter region deletion/duplication testing only), HOXB13 (c.251G>A, p.Gly84Glu), HRAS, KIT, MAX, MEN1, MET, MITF (c.952G>A, p.Glu318Lys variant only), MLH1, MSH2, MSH3, MSH6, MUTYH, NBN, NF1, NF2, NTHL1, PALB2, PDGFRA, PHOX2B, PMS2, POLD1, POLE, POT1, PRKAR1A, PTCH1, PTEN, RAD50, RAD51C, RAD51D, RB1, RECQL4, RET, RNF43, RUNX1, SDHAF2, SDHA (sequence changes only), SDHB, SDHC, SDHD, SMAD4, SMARCA4, SMARCB1, SMARCE1, STK11, SUFU, TERC, TERT, TMEM127, TP53, TSC1, TSC2, VHL, WRN and WT1.   04/23/2019 -  Chemotherapy   The patient had palonosetron (ALOXI) injection 0.25 mg, 0.25 mg, Intravenous,  Once, 4 of 6 cycles Administration: 0.25 mg (04/23/2019), 0.25 mg (05/07/2019), 0.25 mg (05/21/2019), 0.25 mg  (06/04/2019) pegfilgrastim-cbqv (UDENYCA) injection 6 mg, 6 mg, Subcutaneous, Once, 4 of 6 cycles Administration: 6 mg (04/25/2019), 6 mg (05/09/2019), 6 mg (05/23/2019) irinotecan (CAMPTOSAR) 240 mg in sodium chloride 0.9 % 500 mL chemo infusion, 120 mg/m2 = 240 mg (80 % of original dose 150 mg/m2), Intravenous,  Once, 4 of 6 cycles Dose modification: 120 mg/m2 (80 % of original dose 150 mg/m2, Cycle 1, Reason: Other (see comments), Comment: cirrhosis) Administration: 240 mg (04/23/2019), 280 mg (05/07/2019), 280 mg (05/21/2019), 280 mg (06/04/2019) oxaliplatin (ELOXATIN) 130 mg in dextrose 5 % 500 mL chemo infusion, 68 mg/m2 = 130 mg (80 % of original dose 85 mg/m2), Intravenous,  Once, 4 of 6 cycles Dose modification: 68 mg/m2 (80 % of original dose 85 mg/m2, Cycle 1, Reason: Other (see comments), Comment: cirrhosis), 68 mg/m2 (80 % of original dose 85 mg/m2, Cycle 2, Reason: Provider Judgment), 68 mg/m2 (80 % of original dose 85 mg/m2, Cycle 3, Reason: Other (see comments), Comment: neuropathy) Administration: 130 mg (04/23/2019), 130 mg (05/07/2019), 130 mg (05/21/2019), 130 mg (06/04/2019) fosaprepitant (EMEND) 150 mg in sodium chloride 0.9 % 145 mL IVPB, 150 mg, Intravenous,  Once, 4 of 6 cycles Administration: 150 mg (04/23/2019), 150 mg (05/07/2019), 150 mg (05/21/2019), 150 mg (06/04/2019) fluorouracil (ADRUCIL) 3,700 mg in sodium chloride 0.9 % 76 mL chemo infusion, 1,920 mg/m2 = 3,700 mg (80 % of original dose 2,400 mg/m2), Intravenous, 1 Day/Dose, 4 of 6 cycles Dose modification: 1,920 mg/m2 (80 % of original dose 2,400 mg/m2, Cycle 1, Reason: Other (see comments), Comment: cirrhosis) Administration: 3,700 mg (04/23/2019), 4,650 mg (05/07/2019), 4,650 mg (05/21/2019), 4,650 mg (06/04/2019) leucovorin 618 mg in sodium chloride 0.9 % 250 mL infusion, 320 mg/m2 = 618 mg (80 % of original dose 400 mg/m2), Intravenous,  Once, 4 of 6 cycles Dose modification: 320 mg/m2 (80 % of original dose 400 mg/m2, Cycle 1, Reason:  Other (see comments), Comment: cirrhosis) Administration: 618 mg (04/23/2019), 772 mg (05/07/2019), 772 mg (05/21/2019), 772 mg (06/04/2019)  for chemotherapy treatment.      CANCER STAGING: Cancer Staging Pancreatic cancer [facility]) Staging form: Exocrine Pancreas, AJCC 8th Edition - Clinical stage from 03/28/2019: Stage III (cT4, cN1, cM0) - Signed by Derek Jack, MD on 03/28/2019   INTERVAL HISTORY:  Duane Alexander, a 64 y.o. male, returns for routine follow-up and consideration for next cycle of chemotherapy. Duane Alexander was last seen on 06/04/2019.  Due for cycle #5 of FOLFIRINOX & Aloxi today.   Overall, he tells me he has been feeling pretty well. He reports that his hands swell up and numbness/tingling in hands & toes, but denies pain. The swelling lasted only 1 day. The numbness has progressed up to the shoulders, which has not occurred prior to chemo. He is not dropping things and is able to open caps. The numbness is not keeping him awake since he takes melatonin for sleep. He takes NSAIDs and 2 tablets of Norco for his epigastric pain, but not daily. He reports constipation, but he takes Sennacot; his appetite is good and denies N/V. He has 2 BMs per week. He reports that his epigastric pain has improved since beginning treatments.  Overall, he feels ready for next cycle of chemo today.    REVIEW OF SYSTEMS:  Review of Systems  Constitutional: Negative for appetite change and fatigue.  Gastrointestinal: Positive for constipation and diarrhea. Negative for nausea and vomiting.  Neurological: Positive for numbness.  Psychiatric/Behavioral: Positive for sleep disturbance.  All other systems reviewed and are negative.   PAST MEDICAL/SURGICAL HISTORY:  Past Medical History:  Diagnosis Date  . Aortic atherosclerosis ([place])   . COPD (chronic obstructive pulmonary disease) ([place])    per CT findings 2019  . Family history of brain cancer   . Family history of colon cancer   .  Family history of kidney cancer   . Family history of leukemia   . Family history of lung cancer   . Family history of ovarian cancer   . Family history of prostate cancer   . Family history of stomach cancer   . GERD (gastroesophageal reflux disease)   . Hepatitis C    referred to hepatitis clinic 2019 but he never went  . Hyperlipidemia   . Hypertension   . Port-A-Cath in place 04/17/2019  . Smoker   . Wears glasses    Past Surgical History:  Procedure Laterality Date  . ANTERIOR CERVICAL DECOMP/DISCECTOMY FUSION  12/2017   Dr. Consuella Lose  . BIOPSY  03/11/2019   Procedure: BIOPSY;  Surgeon: [name]., MD;  Location: [facility];  Service: Gastroenterology;;  . COLONOSCOPY N/A 01/19/2017   Procedure: COLONOSCOPY;  Surgeon: Rogene Houston, MD;  [facility];  Service: Endoscopy;  Laterality: N/A;  930-moved to 9:45 per Lelon Frohlich  . ESOPHAGOGASTRODUODENOSCOPY (EGD) WITH PROPOFOL N/A 03/11/2019   Procedure: ESOPHAGOGASTRODUODENOSCOPY (EGD) WITH PROPOFOL;  Surgeon: Rush Landmark Telford Nab., MD;  Location: [facility] ENDOSCOPY;  Service: Gastroenterology;  Laterality: N/A;  . EUS N/A 03/11/2019   Procedure: UPPER ENDOSCOPIC ULTRASOUND (EUS) RADIAL;  Surgeon: Rush Landmark Telford Nab., MD;  Location: [facility];  Service: Gastroenterology;  Laterality: N/A;  . FINE NEEDLE ASPIRATION  03/11/2019   Procedure: FINE NEEDLE ASPIRATION (FNA) LINEAR;  Surgeon: [name]., MD;  Location: [facility]  ENDOSCOPY;  Service: Gastroenterology;;  . POLYPECTOMY  01/19/2017   Procedure: POLYPECTOMY;  Surgeon: Rogene Houston, MD;  [facility];  Service: Endoscopy;;  splenic flexure  . POLYPECTOMY  03/11/2019   Procedure: POLYPECTOMY;  Surgeon: Mansouraty, Telford Nab., MD;  Location: [facility];  Service: Gastroenterology;;  . Sol Passer PLACEMENT Left 04/15/2019   Procedure: INSERTION PORT-A-CATH;  Surgeon: Aviva Signs, MD;  Location: [facility];  Service: General;  Laterality:  Left;  . Right eye surgery     as a child-removed muscle from leg and put in upper eye lid    SOCIAL HISTORY:  Social History   Socioeconomic History  . Marital status: Widowed    Spouse name: Not on file  . Number of children: Not on file  . Years of education: Not on file  . Highest education level: Not on file  Occupational History    Employer: [employer]  Tobacco Use  . Smoking status: Current Every Day Smoker    Packs/day: 0.50    Years: 45.00    Pack years: 22.50    Types: Cigarettes  . Smokeless tobacco: Never Used  Vaping Use  . Vaping Use: Never used  Substance and Sexual Activity  . Alcohol use: Not Currently  . Drug use: No  . Sexual activity: Not Currently  Other Topics Concern  . Not on file  Social History Narrative  . Not on file   Social Determinants of Health   Financial Resource Strain: Low Risk   . Difficulty of Paying Living Expenses: Not hard at all  Food Insecurity: No Food Insecurity  . Worried About Charity fundraiser in the Last Year: Never true  . Ran Out of Food in the Last Year: Never true  Transportation Needs: No Transportation Needs  . Lack of Transportation (Medical): No  . Lack of Transportation (Non-Medical): No  Physical Activity: Inactive  . Days of Exercise per Week: 0 days  . Minutes of Exercise per Session: 0 min  Stress: Stress Concern Present  . Feeling of Stress : Rather much  Social Connections: Socially Isolated  . Frequency of Communication with Friends and Family: Three times a week  . Frequency of Social Gatherings with Friends and Family: Three times a week  . Attends Religious Services: Never  . Active Member of Clubs or Organizations: No  . Attends Archivist Meetings: Never  . Marital Status: Widowed  Intimate Partner Violence: Not At Risk  . Fear of Current or Ex-Partner: No  . Emotionally Abused: No  . Physically Abused: No  . Sexually Abused: No    FAMILY HISTORY:  Family History    Problem Relation Age of Onset  . Throat cancer Father 76       died in his 27s of stomach issues?  . Lung cancer Brother 71       smoker, non-small cell  . Ovarian cancer Sister 43       GI related death  . Heart attack Maternal Grandmother   . Brain cancer Maternal Aunt        dx. in her 61s  . Lung cancer Maternal Uncle        dx. in his 62s-60s, smoker  . Stomach cancer Maternal Aunt        dx. in her 37s  . Colon cancer Cousin        dx. >50 - maternal cousin  . Kidney cancer Cousin  dx. in his 65s - maternal cousin  . Prostate cancer Cousin        dx. in his 75s - maternal cousin  . Leukemia Nephew 34  . Heart disease Neg Hx   . Hypertension Neg Hx   . Stroke Neg Hx     CURRENT MEDICATIONS:  Current Outpatient Medications  Medication Sig Dispense Refill  . docusate sodium (COLACE) 100 MG capsule Take 100 mg by mouth daily.    . fluorouracil CALGB 41937 in sodium chloride 0.9 % 150 mL Inject 2,400 mg/m2 into the vein over 48 hr.    . FLUOROURACIL IV Inject into the vein every 21 ( twenty-one) days.    [name] HYDROcodone-acetaminophen (NORCO/VICODIN) 5-325 MG tablet Take 1 tablet by mouth every 4 (four) hours as needed for moderate pain. 84 tablet 0  . IRINOTECAN HCL IV Inject into the vein every 14 (fourteen) days.    [name] LEUCOVORIN CALCIUM IV Inject into the vein every 14 (fourteen) days.    [name] lisinopril (ZESTRIL) 20 MG tablet TAKE 1 TABLET BY MOUTH DAILY 30 tablet 0  . lovastatin (MEVACOR) 20 MG tablet TAKE 1 TABLET BY MOUTH AT BEDTIME 30 tablet 0  . melatonin 5 MG TABS Take 5 mg by mouth at bedtime.    . Multiple Vitamin (MULTIVITAMIN) tablet Take 1 tablet by mouth daily.    . OXALIPLATIN IV Inject into the vein every 14 (fourteen) days.    [name] lidocaine-prilocaine (EMLA) cream Apply a small amount to port a cath site and cover with plastic wrap one hour prior to chemotherapy appointments (Patient not taking: Reported on 06/18/2019) 30 g 3  . loperamide (IMODIUM A-D) 2  MG tablet Take 2 at onset of diarrhea, then 1 tab after every watery bowel movement - DO NOT EXCEED 8 tablets in a 24 hour period (Patient not taking: Reported on 06/18/2019) 100 tablet 1  . prochlorperazine (COMPAZINE) 10 MG tablet Take 1 tablet (10 mg total) by mouth every 6 (six) hours as needed (Nausea or vomiting). (Patient not taking: Reported on 06/18/2019) 60 tablet 3   No current facility-administered medications for this visit.    ALLERGIES:  No Known Allergies  PHYSICAL EXAM:  Performance status (ECOG): 1 - Symptomatic but completely ambulatory  Vitals:   06/18/19 0811  BP: 114/63  Pulse: (!) 58  Resp: 18  Temp: 97.9 F (36.6 C)  SpO2: 100%   Wt Readings from Last 3 Encounters:  06/18/19 160 lb 1.6 oz (72.6 kg)  06/04/19 160 lb 14.4 oz (73 kg)  05/21/19 163 lb 11.2 oz (74.3 kg)   Physical Exam Vitals reviewed.  Constitutional:      Appearance: Normal appearance.  Cardiovascular:     Rate and Rhythm: Normal rate and regular rhythm.     Pulses: Normal pulses.     Heart sounds: Normal heart sounds.  Pulmonary:     Effort: Pulmonary effort is normal.     Breath sounds: Normal breath sounds.  Abdominal:     Palpations: Abdomen is soft. There is no mass.     Tenderness: There is abdominal tenderness (RLQ).  Neurological:     General: No focal deficit present.     Mental Status: He is alert and oriented to person, place, and time.  Psychiatric:        Mood and Affect: Mood normal.        Behavior: Behavior normal.     LABORATORY DATA:  I have reviewed the labs as listed.  CBC Latest Ref Rng & Units 06/18/2019 06/04/2019 05/21/2019  WBC 4.0 - 10.5 K/uL 16.5(H) 19.5(H) 17.3(H)  Hemoglobin 13.0 - 17.0 g/dL 11.2(L) 11.9(L) 12.2(L)  Hematocrit 39 - 52 % 33.8(L) 35.2(L) 36.2(L)  Platelets 150 - 400 K/uL 155 169 189   CMP Latest Ref Rng & Units 06/18/2019 06/04/2019 05/21/2019  Glucose 70 - 99 mg/dL 109(H) 123(H) 215(H)  BUN 8 - 23 mg/dL _0 Creatinine 0.61 - 1.24  mg/dL 0.67 0.66 0.57(L)  Sodium 135 - 145 mmol/L 137 135 138  Potassium 3.5 - 5.1 mmol/L 4.0 4.3 3.8  Chloride 98 - 111 mmol/L 106 103 104  CO2 22 - 32 mmol/L _1 Calcium 8.9 - 10.3 mg/dL 8.6(L) 8.7(L) 8.7(L)  Total Protein 6.5 - 8.1 g/dL 6.9 7.0 6.5  Total Bilirubin 0.3 - 1.2 mg/dL 0.6 0.6 0.4  Alkaline Phos 38 - 126 U/L 115 111 88  AST 15 - 41 U/L 35 40 31  ALT 0 - 44 U/L 43 49(H) 45(H)    DIAGNOSTIC IMAGING:  I have independently reviewed the scans and discussed with the patient.   ASSESSMENT:  1. Pancreatic adenocarcinoma (UT 4 N1 MX): -PET scan on 03/28/2019 did not show any evidence of metastatic disease. -CA 19-9 was 40 on 02/23/2019. -Germline mutation testing was negative. -FOLFIRINOX started on 04/23/2019.  2.  Epigastric pain: -This is fairly improved since the start of treatments. -He is using hydrocodone 5/325 as needed.  Some days he does not require any pain medication.  3.  Liver disease: -CT scan showed mild degree of cirrhosis.  History of hepatitis C which was untreated.   PLAN:  1.  Pancreatic adenocarcinoma: -I have reviewed his labs.  LFTs are normal.  White count and platelets are adequate to proceed with cycle 5 today. -He will come back in 2 weeks for follow-up for cycle 6. -Please order CT scan of abdomen and pelvis with pancreatic protocol after cycle 6.  Also order CT scan of the chest with contrast.  Also check CA 19-9 level. -I will see him back in 4 weeks to discuss results.  2.  Tingling/numbness: -He had numbness in the fingertips and toes from the spine problem. -Reports that his numbness has progressed to the hands and sometimes to elbows.  He is not dropping things.  Is not affecting his day-to-day activities. -I will further cut back on the dose of oxaliplatin.  3.  Epigastric pain: -Continue hydrocodone 5/325 as needed.  Overall pain improved since start of therapy.   Orders placed this encounter:  No orders of the defined  types were placed in this encounter.    Derek Jack, MD [place] [phone]   I, Milinda Antis, am acting as a scribe for Dr. Sanda Linger.  I, Derek Jack MD, have reviewed the above documentation for accuracy and completeness, and I agree with the above.

## 2019-06-20 ENCOUNTER — Inpatient Hospital Stay (HOSPITAL_COMMUNITY): Payer: BC Managed Care – PPO

## 2019-06-20 ENCOUNTER — Other Ambulatory Visit: Payer: Self-pay

## 2019-06-20 VITALS — BP 121/67 | HR 58 | Temp 97.9°F | Resp 18

## 2019-06-20 DIAGNOSIS — G893 Neoplasm related pain (acute) (chronic): Secondary | ICD-10-CM | POA: Diagnosis not present

## 2019-06-20 DIAGNOSIS — J449 Chronic obstructive pulmonary disease, unspecified: Secondary | ICD-10-CM | POA: Diagnosis not present

## 2019-06-20 DIAGNOSIS — Z5189 Encounter for other specified aftercare: Secondary | ICD-10-CM | POA: Diagnosis not present

## 2019-06-20 DIAGNOSIS — B182 Chronic viral hepatitis C: Secondary | ICD-10-CM | POA: Diagnosis not present

## 2019-06-20 DIAGNOSIS — F17210 Nicotine dependence, cigarettes, uncomplicated: Secondary | ICD-10-CM | POA: Diagnosis not present

## 2019-06-20 DIAGNOSIS — C250 Malignant neoplasm of head of pancreas: Secondary | ICD-10-CM

## 2019-06-20 DIAGNOSIS — Z95828 Presence of other vascular implants and grafts: Secondary | ICD-10-CM

## 2019-06-20 DIAGNOSIS — K7460 Unspecified cirrhosis of liver: Secondary | ICD-10-CM | POA: Diagnosis not present

## 2019-06-20 DIAGNOSIS — Z5111 Encounter for antineoplastic chemotherapy: Secondary | ICD-10-CM | POA: Diagnosis not present

## 2019-06-20 MED ORDER — SODIUM CHLORIDE 0.9% FLUSH
10.0000 mL | INTRAVENOUS | Status: DC | PRN
  Administered 2019-06-20: 10 mL

## 2019-06-20 MED ORDER — HEPARIN SOD (PORK) LOCK FLUSH 100 UNIT/ML IV SOLN
500.0000 [IU] | Freq: Once | INTRAVENOUS | Status: AC | PRN
  Administered 2019-06-20: 500 [IU]

## 2019-06-20 MED ORDER — PEGFILGRASTIM-CBQV 6 MG/0.6ML ~~LOC~~ SOSY
6.0000 mg | PREFILLED_SYRINGE | Freq: Once | SUBCUTANEOUS | Status: AC
  Administered 2019-06-20: 6 mg via SUBCUTANEOUS
  Filled 2019-06-20: qty 0.6

## 2019-06-23 DIAGNOSIS — C259 Malignant neoplasm of pancreas, unspecified: Secondary | ICD-10-CM | POA: Diagnosis not present

## 2019-06-24 ENCOUNTER — Other Ambulatory Visit (HOSPITAL_COMMUNITY): Payer: Self-pay | Admitting: *Deleted

## 2019-06-24 ENCOUNTER — Other Ambulatory Visit: Payer: Self-pay | Admitting: Medical

## 2019-06-24 DIAGNOSIS — C250 Malignant neoplasm of head of pancreas: Secondary | ICD-10-CM

## 2019-06-25 ENCOUNTER — Encounter: Payer: BC Managed Care – PPO | Admitting: Medical

## 2019-06-25 ENCOUNTER — Encounter: Payer: Self-pay | Admitting: Medical

## 2019-06-25 ENCOUNTER — Other Ambulatory Visit: Payer: Self-pay

## 2019-06-25 ENCOUNTER — Ambulatory Visit (INDEPENDENT_AMBULATORY_CARE_PROVIDER_SITE_OTHER): Payer: BC Managed Care – PPO | Admitting: Medical

## 2019-06-25 VITALS — BP 102/68 | HR 72 | Ht 72.0 in | Wt 150.8 lb

## 2019-06-25 DIAGNOSIS — Z8051 Family history of malignant neoplasm of kidney: Secondary | ICD-10-CM

## 2019-06-25 DIAGNOSIS — H9319 Tinnitus, unspecified ear: Secondary | ICD-10-CM

## 2019-06-25 DIAGNOSIS — M5136 Other intervertebral disc degeneration, lumbar region: Secondary | ICD-10-CM

## 2019-06-25 DIAGNOSIS — M79602 Pain in left arm: Secondary | ICD-10-CM

## 2019-06-25 DIAGNOSIS — R109 Unspecified abdominal pain: Secondary | ICD-10-CM

## 2019-06-25 DIAGNOSIS — C250 Malignant neoplasm of head of pancreas: Secondary | ICD-10-CM

## 2019-06-25 DIAGNOSIS — Z125 Encounter for screening for malignant neoplasm of prostate: Secondary | ICD-10-CM

## 2019-06-25 DIAGNOSIS — Z0000 Encounter for general adult medical examination without abnormal findings: Secondary | ICD-10-CM

## 2019-06-25 DIAGNOSIS — I700 Atherosclerosis of aorta: Secondary | ICD-10-CM

## 2019-06-25 DIAGNOSIS — G8929 Other chronic pain: Secondary | ICD-10-CM

## 2019-06-25 DIAGNOSIS — Z7185 Encounter for immunization safety counseling: Secondary | ICD-10-CM

## 2019-06-25 DIAGNOSIS — J449 Chronic obstructive pulmonary disease, unspecified: Secondary | ICD-10-CM

## 2019-06-25 DIAGNOSIS — M79604 Pain in right leg: Secondary | ICD-10-CM

## 2019-06-25 DIAGNOSIS — F17200 Nicotine dependence, unspecified, uncomplicated: Secondary | ICD-10-CM

## 2019-06-25 DIAGNOSIS — G629 Polyneuropathy, unspecified: Secondary | ICD-10-CM | POA: Diagnosis not present

## 2019-06-25 DIAGNOSIS — Z800 Family history of malignant neoplasm of digestive organs: Secondary | ICD-10-CM

## 2019-06-25 DIAGNOSIS — IMO0001 Reserved for inherently not codable concepts without codable children: Secondary | ICD-10-CM

## 2019-06-25 DIAGNOSIS — Z806 Family history of leukemia: Secondary | ICD-10-CM

## 2019-06-25 DIAGNOSIS — I10 Essential (primary) hypertension: Secondary | ICD-10-CM

## 2019-06-25 DIAGNOSIS — Z8041 Family history of malignant neoplasm of ovary: Secondary | ICD-10-CM

## 2019-06-25 DIAGNOSIS — H53039 Strabismic amblyopia, unspecified eye: Secondary | ICD-10-CM

## 2019-06-25 DIAGNOSIS — E785 Hyperlipidemia, unspecified: Secondary | ICD-10-CM

## 2019-06-25 DIAGNOSIS — K7460 Unspecified cirrhosis of liver: Secondary | ICD-10-CM

## 2019-06-25 DIAGNOSIS — Z808 Family history of malignant neoplasm of other organs or systems: Secondary | ICD-10-CM

## 2019-06-25 DIAGNOSIS — M79605 Pain in left leg: Secondary | ICD-10-CM

## 2019-06-25 DIAGNOSIS — K8689 Other specified diseases of pancreas: Secondary | ICD-10-CM

## 2019-06-25 DIAGNOSIS — I2510 Atherosclerotic heart disease of native coronary artery without angina pectoris: Secondary | ICD-10-CM

## 2019-06-25 DIAGNOSIS — Z801 Family history of malignant neoplasm of trachea, bronchus and lung: Secondary | ICD-10-CM

## 2019-06-25 DIAGNOSIS — M79601 Pain in right arm: Secondary | ICD-10-CM

## 2019-06-25 DIAGNOSIS — Z95828 Presence of other vascular implants and grafts: Secondary | ICD-10-CM

## 2019-06-25 DIAGNOSIS — Z7189 Other specified counseling: Secondary | ICD-10-CM

## 2019-06-25 DIAGNOSIS — M51369 Other intervertebral disc degeneration, lumbar region without mention of lumbar back pain or lower extremity pain: Secondary | ICD-10-CM

## 2019-06-25 DIAGNOSIS — B182 Chronic viral hepatitis C: Secondary | ICD-10-CM

## 2019-06-25 DIAGNOSIS — R1013 Epigastric pain: Secondary | ICD-10-CM

## 2019-06-25 DIAGNOSIS — Z8042 Family history of malignant neoplasm of prostate: Secondary | ICD-10-CM

## 2019-06-25 NOTE — Progress Notes (Signed)
Subjective: Chief Complaint  Patient presents with   Annual Exam    no labs- call cancer center [name]    Here for physical  Medical team: Dr. Sanda Klein, cardiology Dr. Frankey Shown, orthopedics Dr. Hildred Laser, GI Dr. Derek Jack, oncology Not currently seeing eye doctor and dentist Duane Alexander, Duane Eng, PA-C here for primary care  He has a medical history significant for pancreatic adenocarcinoma, cirrhosis of the liver, untreated hepatitis C, ongoing numbness and tingling in the extremities, epigastric pain, COPD, tobacco use, hypertension, aortic atherosclerosis, hyperlipidemia  His recent medical care has been related to cancer treatment for pancreatic cancer.  He is currently doing 36 hour chemo treatments, 6 hours in the cancer center, 30 hours at home via pump.   He gets a booster shot for blood cells after chemo and he feels wiped out for 2 days.  Overall though he feels he is handling chemo relatively well.  He had recent PET scan, and has plans for whipple surgery.  Last work was 04/2019 due to the onset of chemotherapy  He is hopeful but he also realizes the severity of his illness.  His prior wife died 88 years of pancreatic cancer  He has worked on [employer].  His sister whom he lives with his his POA and he has no children.  Living with sister in [place], [place] currently.  Regarding neuropathy, no prior lyrica or gabapentin therapy  Past Medical History:  Diagnosis Date   Aortic atherosclerosis ([place])    COPD (chronic obstructive pulmonary disease) ([place])    per CT findings 2019   Family history of brain cancer    Family history of colon cancer    Family history of kidney cancer    Family history of leukemia    Family history of lung cancer    Family history of ovarian cancer    Family history of prostate cancer    Family history of stomach cancer    GERD (gastroesophageal reflux disease)    Hepatitis C     referred to hepatitis clinic 2019 but he never went   Hyperlipidemia    Hypertension    Port-A-Cath in place 04/17/2019   Smoker    Wears glasses     Past Surgical History:  Procedure Laterality Date   ANTERIOR CERVICAL DECOMP/DISCECTOMY FUSION  12/2017   Dr. Consuella Lose   BIOPSY  03/11/2019   Procedure: BIOPSY;  Surgeon: [name]., MD;  Location: [facility];  Service: Gastroenterology;;   COLONOSCOPY N/A 01/19/2017   Procedure: COLONOSCOPY;  Surgeon: Rogene Houston, MD;  [facility];  Service: Endoscopy;  Laterality: N/A;  930-moved to 9:45 per Lelon Frohlich   ESOPHAGOGASTRODUODENOSCOPY (EGD) WITH PROPOFOL N/A 03/11/2019   Procedure: ESOPHAGOGASTRODUODENOSCOPY (EGD) WITH PROPOFOL;  Surgeon: Rush Landmark Telford Nab., MD;  Location: [facility] ENDOSCOPY;  Service: Gastroenterology;  Laterality: N/A;   EUS N/A 03/11/2019   Procedure: UPPER ENDOSCOPIC ULTRASOUND (EUS) RADIAL;  Surgeon: [name]., MD;  Location: [facility];  Service: Gastroenterology;  Laterality: N/A;   FINE NEEDLE ASPIRATION  03/11/2019   Procedure: FINE NEEDLE ASPIRATION (FNA) LINEAR;  Surgeon: [name]., MD;  Location: [facility];  Service: Gastroenterology;;   POLYPECTOMY  01/19/2017   Procedure: POLYPECTOMY;  Surgeon: Rogene Houston, MD;  [facility];  Service: Endoscopy;;  splenic flexure   POLYPECTOMY  03/11/2019   Procedure: POLYPECTOMY;  Surgeon: Rush Landmark Telford Nab., MD;  Location: [facility];  Service: Gastroenterology;;  PORTACATH PLACEMENT Left 04/15/2019   Procedure: INSERTION PORT-A-CATH;  Surgeon: Aviva Signs, MD;  Location: [facility];  Service: General;  Laterality: Left;   Right eye surgery     as a child-removed muscle from leg and put in upper eye lid    Social History   Socioeconomic History   Marital status: Widowed    Spouse name: Not on file   Number of children: Not on file   Years of education: Not on file   Highest  education level: Not on file  Occupational History    Employer: [employer]  Tobacco Use   Smoking status: Current Every Day Smoker    Packs/day: 0.50    Years: 45.00    Pack years: 22.50    Types: Cigarettes   Smokeless tobacco: Never Used  Vaping Use   Vaping Use: Never used  Substance and Sexual Activity   Alcohol use: Not Currently   Drug use: No   Sexual activity: Not Currently  Other Topics Concern   Not on file  Social History Narrative   Not on file   Social Determinants of Health   Financial Resource Strain: Low Risk    Difficulty of Paying Living Expenses: Not hard at all  Food Insecurity: No Food Insecurity   Worried About Charity fundraiser in the Last Year: Never true   Ran Out of Food in the Last Year: Never true  Transportation Needs: No Transportation Needs   Lack of Transportation (Medical): No   Lack of Transportation (Non-Medical): No  Physical Activity: Inactive   Days of Exercise per Week: 0 days   Minutes of Exercise per Session: 0 min  Stress: Stress Concern Present   Feeling of Stress : Rather much  Social Connections: Socially Isolated   Frequency of Communication with Friends and Family: Three times a week   Frequency of Social Gatherings with Friends and Family: Three times a week   Attends Religious Services: Never   Active Member of Clubs or Organizations: No   Attends Archivist Meetings: Never   Marital Status: Widowed  Human resources officer Violence: Not At Risk   Fear of Current or Ex-Partner: No   Emotionally Abused: No   Physically Abused: No   Sexually Abused: No    Family History  Problem Relation Age of Onset   Throat cancer Father 64       died in his 10s of stomach issues?   Lung cancer Brother 23       smoker, non-small cell   Ovarian cancer Sister 43       GI related death   Heart attack Maternal Grandmother    Brain cancer Maternal Aunt        dx. in her 59s   Lung  cancer Maternal Uncle        dx. in his 30s-60s, smoker   Stomach cancer Maternal Aunt        dx. in her 15s   Colon cancer Cousin        dx. >50 - maternal cousin   Kidney cancer Cousin        dx. in his 6s - maternal cousin   Prostate cancer Cousin        dx. in his 60s - maternal cousin   Leukemia Nephew 65   Heart disease Neg Hx    Hypertension Neg Hx    Stroke Neg Hx      Current Outpatient Medications:  docusate sodium (COLACE) 100 MG capsule, Take 100 mg by mouth daily., Disp: , Rfl:    DULoxetine (CYMBALTA) 30 MG capsule, Take 1 capsule (30 mg total) by mouth daily., Disp: 30 capsule, Rfl: 0   fluorouracil CALGB 72536 in sodium chloride 0.9 % 150 mL, Inject 2,400 mg/m2 into the vein over 48 hr., Disp: , Rfl:    FLUOROURACIL IV, Inject into the vein every 21 ( twenty-one) days., Disp: , Rfl:    HYDROcodone-acetaminophen (NORCO/VICODIN) 5-325 MG tablet, Take 1 tablet by mouth every 4 (four) hours as needed for moderate pain., Disp: 84 tablet, Rfl: 0   IRINOTECAN HCL IV, Inject into the vein every 14 (fourteen) days., Disp: , Rfl:    LEUCOVORIN CALCIUM IV, Inject into the vein every 14 (fourteen) days., Disp: , Rfl:    lidocaine-prilocaine (EMLA) cream, Apply a small amount to port a cath site and cover with plastic wrap one hour prior to chemotherapy appointments, Disp: 30 g, Rfl: 3   lisinopril (ZESTRIL) 20 MG tablet, TAKE 1 TABLET BY MOUTH DAILY, Disp: 30 tablet, Rfl: 0   loperamide (IMODIUM A-D) 2 MG tablet, Take 2 at onset of diarrhea, then 1 tab after every watery bowel movement - DO NOT EXCEED 8 tablets in a 24 hour period, Disp: 100 tablet, Rfl: 1   lovastatin (MEVACOR) 20 MG tablet, TAKE 1 TABLET BY MOUTH AT BEDTIME, Disp: 30 tablet, Rfl: 0   melatonin 5 MG TABS, Take 5 mg by mouth at bedtime., Disp: , Rfl:    Multiple Vitamin (MULTIVITAMIN) tablet, Take 1 tablet by mouth daily., Disp: , Rfl:    OXALIPLATIN IV, Inject into the vein every 14  (fourteen) days., Disp: , Rfl:    prochlorperazine (COMPAZINE) 10 MG tablet, Take 1 tablet (10 mg total) by mouth every 6 (six) hours as needed (Nausea or vomiting). (Patient not taking: Reported on 06/18/2019), Disp: 60 tablet, Rfl: 3  No Known Allergies  ROS as in subjective   Objective: BP 102/68    Pulse 72    Ht 6' (1.829 m)    Wt 150 lb 12.8 oz (68.4 kg)    SpO2 99%    BMI 20.45 kg/m   Gen: wd, wn, nad white male hent - there is some asymmetry of eye motions unchanged since birth per patient c/w strabismus, otherwise PERRLA, EOMi, hent otherwise unremarkable Skin : warm, dry Heart rrr, normal s1, s2, no murmur Lungs clear Abdomen +bs, soft, mild tendnerss upper abdomen in general, no organomegaly Back: nontender Pulses 1+ UE and LE No ext edema Psych: pleasant, good eye contact, answers questions appropriately Neuro: other than asymmetry of EOM c/w strabismus, CN2-12 intact, decreased sensation in bilat feet, but normal strength, 1+ DTRs UE and LE GU/rectal -deferred    Assessment: Encounter Diagnoses  Name Primary?   Encounter for health maintenance examination in adult Yes   Neuropathy    DDD (degenerative disc disease), lumbar    Tinnitus, unspecified laterality    Smoking    Hyperlipidemia, unspecified hyperlipidemia type    Port-A-Cath in place    Pancreatic mass    Vaccine counseling    Chronic pain of both upper extremities    Chronic pain of both lower extremities    Abdominal pain, unspecified abdominal location    Malignant neoplasm of head of pancreas ([place])    Chronic hepatitis C without hepatic coma (HCC)    Chronic obstructive pulmonary disease, unspecified COPD type ([place])    Cirrhosis of liver without  ascites, unspecified hepatic cirrhosis type ([place])    Coronary artery calcification seen on CT scan    Aortic atherosclerosis (HCC)    Hypertension, unspecified type    Family history of lung cancer    Screening for prostate  cancer    Epigastric pain    Family history of ovarian cancer    Family history of leukemia    Family history of brain cancer    Family history of stomach cancer    Family history of colon cancer    Family history of kidney cancer    Family history of prostate cancer    Strabismic amblyopia, unspecified laterality      Plan: Reviewed his chart record, problem list, discussed his recent cancer therapy.  Vaccines: He is up-to-date on pneumococcal and Tdap vaccines  He is not up-to-date on Shingrix   He is up to date on Covid vaccine, moderna   Hep B antibody not immune 08/2018.  Due for Hep B vaccine as well    Other cancer screening: I reviewed his January 19, 2017 colonoscopy with Dr. Hildred Laser, and at that time he had diverticulosis of the sigmoid colon, external hemorrhoids, one polyp that was found to be tubular adenoma  Lung cancer screening:  He had a chest CT back in May 2018 that showed no adenopathy, no mass but there was atherosclerotic calcification and rather minimal coronary artery calcification  He just recently had a PET scan on March 27, 2019 that showed no lung involvement.  There was uptake in the pancreas as well as a focal area of the left hemithyroid.  Prostate cancer screening: PSA August 2020 was normal Repeat PSA in near future     Significant other issues:  Pancreatic cancer-I reviewed his recent June 18, 2019 office note from oncology  Hepatitis C chronic-untreated at this point.  Diagnosed 2019, but he declined to go for consult with the hepatitis clinic  COPD-he continues to smoke.  No current dyspnea.  adivsd smoking cessation  Hypertension-continue Lisinopril 20mg  daily  Hyperlipidemia and aortic atherosclerosis, coronary artery disease - currently on Mevacor 20 mg daily.  We have to weight risk of statin safety in light of cirrhosis vs known aortic and coronary atherosclerosis.    Will get input by GI and risks/benefits of  statin in light of his other issues  Numbness and tingling-presumed to be related to prior heavier alcohol use, cirrhosis of liver, but can't rule out other.  Consider neurology consult   Cirrhosis of the liver-most recent CT abdomen/pelvis 02/07/19 showing:  Hepatobiliary: Mild nodularity of the liver surface with enlarged left liver compatible with cirrhosis. Differential attenuation of the material within the gallbladder with hyperdense material layered dependently compatible with biliary sludge/microlithiasis. No inflammatory changes.  Advised follow up with gastroenterology   Duane Alexander was seen today for annual exam.  Diagnoses and all orders for this visit:  Encounter for health maintenance examination in adult -     Lipid panel; Future -     PSA; Future -     TSH; Future -     T4, free; Future  Neuropathy  DDD (degenerative disc disease), lumbar  Tinnitus, unspecified laterality  Smoking  Hyperlipidemia, unspecified hyperlipidemia type -     Lipid panel; Future  Port-A-Cath in place  Pancreatic mass  Vaccine counseling  Chronic pain of both upper extremities  Chronic pain of both lower extremities  Abdominal pain, unspecified abdominal location  Malignant neoplasm of head of pancreas (HCC)  Chronic  hepatitis C without hepatic coma (HCC)  Chronic obstructive pulmonary disease, unspecified COPD type ([place])  Cirrhosis of liver without ascites, unspecified hepatic cirrhosis type ([place])  Coronary artery calcification seen on CT scan  Aortic atherosclerosis (HCC)  Hypertension, unspecified type  Family history of lung cancer  Screening for prostate cancer -     PSA; Future  Epigastric pain  Family history of ovarian cancer  Family history of leukemia  Family history of brain cancer  Family history of stomach cancer  Family history of colon cancer  Family history of kidney cancer  Family history of prostate cancer  Strabismic amblyopia,  unspecified laterality     F/u pending call back

## 2019-06-27 ENCOUNTER — Encounter (HOSPITAL_COMMUNITY): Payer: Self-pay | Admitting: *Deleted

## 2019-06-27 ENCOUNTER — Other Ambulatory Visit (HOSPITAL_COMMUNITY): Payer: Self-pay | Admitting: *Deleted

## 2019-06-27 DIAGNOSIS — C250 Malignant neoplasm of head of pancreas: Secondary | ICD-10-CM

## 2019-06-27 MED ORDER — HYDROCODONE-ACETAMINOPHEN 5-325 MG PO TABS: 1.0000 | ORAL_TABLET | ORAL | 0 refills | Status: DC | PRN

## 2019-07-02 ENCOUNTER — Inpatient Hospital Stay (HOSPITAL_COMMUNITY): Payer: BC Managed Care – PPO

## 2019-07-02 ENCOUNTER — Inpatient Hospital Stay (HOSPITAL_BASED_OUTPATIENT_CLINIC_OR_DEPARTMENT_OTHER): Payer: BC Managed Care – PPO | Admitting: Oncology

## 2019-07-02 ENCOUNTER — Encounter (HOSPITAL_COMMUNITY): Payer: Self-pay | Admitting: Oncology

## 2019-07-02 ENCOUNTER — Other Ambulatory Visit: Payer: Self-pay

## 2019-07-02 ENCOUNTER — Encounter (HOSPITAL_COMMUNITY): Payer: Self-pay | Admitting: *Deleted

## 2019-07-02 VITALS — BP 115/66 | HR 61 | Temp 97.5°F | Resp 18

## 2019-07-02 VITALS — BP 113/63 | HR 56 | Temp 97.5°F | Resp 18 | Wt 166.2 lb

## 2019-07-02 DIAGNOSIS — C250 Malignant neoplasm of head of pancreas: Secondary | ICD-10-CM | POA: Diagnosis not present

## 2019-07-02 DIAGNOSIS — K7460 Unspecified cirrhosis of liver: Secondary | ICD-10-CM | POA: Diagnosis not present

## 2019-07-02 DIAGNOSIS — B182 Chronic viral hepatitis C: Secondary | ICD-10-CM | POA: Diagnosis not present

## 2019-07-02 DIAGNOSIS — F17210 Nicotine dependence, cigarettes, uncomplicated: Secondary | ICD-10-CM | POA: Diagnosis not present

## 2019-07-02 DIAGNOSIS — Z95828 Presence of other vascular implants and grafts: Secondary | ICD-10-CM

## 2019-07-02 DIAGNOSIS — Z5111 Encounter for antineoplastic chemotherapy: Secondary | ICD-10-CM | POA: Diagnosis not present

## 2019-07-02 DIAGNOSIS — C259 Malignant neoplasm of pancreas, unspecified: Secondary | ICD-10-CM | POA: Diagnosis not present

## 2019-07-02 DIAGNOSIS — J449 Chronic obstructive pulmonary disease, unspecified: Secondary | ICD-10-CM | POA: Diagnosis not present

## 2019-07-02 DIAGNOSIS — R1013 Epigastric pain: Secondary | ICD-10-CM | POA: Diagnosis not present

## 2019-07-02 DIAGNOSIS — Z5189 Encounter for other specified aftercare: Secondary | ICD-10-CM | POA: Diagnosis not present

## 2019-07-02 DIAGNOSIS — G893 Neoplasm related pain (acute) (chronic): Secondary | ICD-10-CM | POA: Diagnosis not present

## 2019-07-02 LAB — COMPREHENSIVE METABOLIC PANEL
ALT: 38 U/L (ref 0–44)
AST: 35 U/L (ref 15–41)
Albumin: 3.2 g/dL — ABNORMAL LOW (ref 3.5–5.0)
Alkaline Phosphatase: 115 U/L (ref 38–126)
Anion gap: 7 (ref 5–15)
BUN: 15 mg/dL (ref 8–23)
CO2: 24 mmol/L (ref 22–32)
Calcium: 8.6 mg/dL — ABNORMAL LOW (ref 8.9–10.3)
Chloride: 107 mmol/L (ref 98–111)
Creatinine, Ser: 0.75 mg/dL (ref 0.61–1.24)
GFR calc Af Amer: >60 mL/min (ref >60)
GFR calc non Af Amer: >60 mL/min (ref >60)
Glucose, Bld: 111 mg/dL — ABNORMAL HIGH (ref 70–99)
Potassium: 4 mmol/L (ref 3.5–5.1)
Sodium: 138 mmol/L (ref 135–145)
Total Bilirubin: 0.5 mg/dL (ref 0.3–1.2)
Total Protein: 6.3 g/dL — ABNORMAL LOW (ref 6.5–8.1)

## 2019-07-02 LAB — CBC WITH DIFFERENTIAL/PLATELET
Abs Immature Granulocytes: 0.35 10*3/uL — ABNORMAL HIGH (ref 0.00–0.07)
Basophils Absolute: 0.1 10*3/uL (ref 0.0–0.1)
Basophils Relative: 1 %
Eosinophils Absolute: 0.2 10*3/uL (ref 0.0–0.5)
Eosinophils Relative: 2 %
HCT: 31.2 % — ABNORMAL LOW (ref 39.0–52.0)
Hemoglobin: 10.2 g/dL — ABNORMAL LOW (ref 13.0–17.0)
Immature Granulocytes: 3 %
Lymphocytes Relative: 22 %
Lymphs Abs: 3 10*3/uL (ref 0.7–4.0)
MCH: 34.5 pg — ABNORMAL HIGH (ref 26.0–34.0)
MCHC: 32.7 g/dL (ref 30.0–36.0)
MCV: 105.4 fL — ABNORMAL HIGH (ref 80.0–100.0)
Monocytes Absolute: 1.2 10*3/uL — ABNORMAL HIGH (ref 0.1–1.0)
Monocytes Relative: 9 %
Neutro Abs: 9.2 10*3/uL — ABNORMAL HIGH (ref 1.7–7.7)
Neutrophils Relative %: 63 %
Platelets: 143 10*3/uL — ABNORMAL LOW (ref 150–400)
RBC: 2.96 MIL/uL — ABNORMAL LOW (ref 4.22–5.81)
RDW: 16.3 % — ABNORMAL HIGH (ref 11.5–15.5)
WBC: 14.2 10*3/uL — ABNORMAL HIGH (ref 4.0–10.5)
nRBC: 0 % (ref 0.0–0.2)

## 2019-07-02 MED ORDER — SODIUM CHLORIDE 0.9 % IV SOLN
2400.0000 mg/m2 | INTRAVENOUS | Status: DC
  Administered 2019-07-02: 4650 mg via INTRAVENOUS
  Filled 2019-07-02: qty 93

## 2019-07-02 MED ORDER — SODIUM CHLORIDE 0.9 % IV SOLN
10.0000 mg | Freq: Once | INTRAVENOUS | Status: AC
  Administered 2019-07-02: 10 mg via INTRAVENOUS
  Filled 2019-07-02: qty 10

## 2019-07-02 MED ORDER — ATROPINE SULFATE 1 MG/ML IJ SOLN
0.5000 mg | Freq: Once | INTRAMUSCULAR | Status: AC | PRN
  Administered 2019-07-02: 0.5 mg via INTRAVENOUS
  Filled 2019-07-02: qty 1

## 2019-07-02 MED ORDER — SODIUM CHLORIDE 0.9 % IV SOLN
400.0000 mg/m2 | Freq: Once | INTRAVENOUS | Status: AC
  Administered 2019-07-02: 772 mg via INTRAVENOUS
  Filled 2019-07-02: qty 38.6

## 2019-07-02 MED ORDER — SODIUM CHLORIDE 0.9% FLUSH
10.0000 mL | INTRAVENOUS | Status: DC | PRN
  Administered 2019-07-02: 10 mL

## 2019-07-02 MED ORDER — SODIUM CHLORIDE 0.9 % IV SOLN
150.0000 mg/m2 | Freq: Once | INTRAVENOUS | Status: AC
  Administered 2019-07-02: 280 mg via INTRAVENOUS
  Filled 2019-07-02: qty 4

## 2019-07-02 MED ORDER — SODIUM CHLORIDE 0.9 % IV SOLN
150.0000 mg | Freq: Once | INTRAVENOUS | Status: AC
  Administered 2019-07-02: 150 mg via INTRAVENOUS
  Filled 2019-07-02: qty 150

## 2019-07-02 MED ORDER — OXALIPLATIN CHEMO INJECTION 100 MG/20ML
53.0000 mg/m2 | Freq: Once | INTRAVENOUS | Status: AC
  Administered 2019-07-02: 100 mg via INTRAVENOUS
  Filled 2019-07-02: qty 20

## 2019-07-02 MED ORDER — DEXTROSE 5 % IV SOLN: Freq: Once | INTRAVENOUS | Status: AC

## 2019-07-02 MED ORDER — PALONOSETRON HCL INJECTION 0.25 MG/5ML
0.2500 mg | Freq: Once | INTRAVENOUS | Status: AC
  Administered 2019-07-02: 0.25 mg via INTRAVENOUS
  Filled 2019-07-02: qty 5

## 2019-07-02 NOTE — Patient Instructions (Signed)
[place] at [facility] Discharge Instructions  You were seen today by Kirby Crigler PA. He went over your recent lab results. He will see you back in 2 weeks for labs, scans and follow up.   Thank you for choosing [place] at [facility] to provide your oncology and hematology care.  To afford each patient quality time with our provider, please arrive at least 15 minutes before your scheduled appointment time.   If you have a lab appointment with the [facility] please come in thru the  Main Entrance and check in at the main information desk  You need to re-schedule your appointment should you arrive 10 or more minutes late.  We strive to give you quality time with our providers, and arriving late affects you and other patients whose appointments are after yours.  Also, if you no show three or more times for appointments you may be dismissed from the clinic at the providers discretion.     Again, thank you for choosing [facility].  Our hope is that these requests will decrease the amount of time that you wait before being seen by our physicians.       _____________________________________________________________  Should you have questions after your visit to [facility], please contact our office at [phone] between the hours of 8:00 a.m. and 4:30 p.m.  Voicemails left after 4:00 p.m. will not be returned until the following business day.  For prescription refill requests, have your pharmacy contact our office and allow 72 hours.    Cancer Center Support Programs:   > Cancer Support Group  2nd Tuesday of the month 1pm-2pm, Journey Room

## 2019-07-02 NOTE — Progress Notes (Signed)
Patient has been assessed, vital signs and labs have been reviewed by Tom Kefalas PA. ANC, Creatinine, LFTs, and Platelets are within treatment parameters per Tom Kefalas PA. The patient is good to proceed with treatment at this time.   

## 2019-07-02 NOTE — Progress Notes (Signed)
Duane Alexander presents today for D1C6 FOLFIRINOX . Pt denies any new changes or symptoms since last treatment. He reports that his neuropathy is some worse at times. It is in his toes constantly and from his shoulder down into his hands and fingers. He states this is chronic for him because of lower back problem he has and that he had the neuropathy before he got diagnosed with cancer.Lab results and vitals have been reviewed and are stable and within parameters for treatment. Patient has been assessed by Kirby Crigler, PA who has approved proceeding with treatment today as planned.  Infusions tolerated without incident or complaint. VSS upon completion of treatment. 5FU infusing through port via home infusion pump without difficulties.Discharged in satisfactory condition with follow up instructions.

## 2019-07-02 NOTE — Progress Notes (Signed)
Tysinger, Duane Eng, PA-C [place] [address]  Malignant neoplasm of head of pancreas [facility]) - Plan: CT Abdomen Pelvis W Contrast, CT Chest W Contrast  Epigastric pain  Chronic obstructive pulmonary disease, unspecified COPD type ([place])  Cirrhosis of liver without ascites, unspecified hepatic cirrhosis type ([place])  Chronic hepatitis C without hepatic coma (Duane Alexander)   HISTORY OF PRESENT ILLNESS: Malignant neoplasm of head of pancreas (Duane Alexander) Pancreatic adenocarcinoma (UT 4 N1 MX): -PET scan on 03/28/2019 did not show any evidence of metastatic disease. -CA 19-9 was 40 on 02/23/2019. -Germline mutation testing was negative. -FOLFIRINOX started on 04/23/2019.  CURRENT STATUS: Duane Alexander 64 y.o. male returns for followup of pancreatic cancer currently undergoing neoadjuvant systemic chemotherapy with FOLFIRINOX beginning on 04/23/2019.  Today is cycle #6 and plans are to proceed with restaging scans in the next 1-2 weeks and development of surgical intervention following.  He is doing well.  Despite FOLFIRINOX being an extremely difficult chemotherapy regimen, he is tolerating very well.  His blood counts are holding steady.  No nausea or vomiting.  He denies severe GERD, mouth sores, diarrhea, and signs/symptoms suggestive of palmar-plantar erythrodysesthesia.  He denies abnormal bleeding or bruising.  Bowels are working appropriately.  He does admit to an increase in flatulence with MiraLAX therapy but this is keeping his bowels on a regular program and therefore he will continue with his bowel regimen.  Review of Systems  Constitutional: Negative.  Negative for chills, fever and weight loss.  HENT: Negative.   Eyes: Negative.   Respiratory: Negative.  Negative for cough.   Cardiovascular: Negative.  Negative for chest pain.  Gastrointestinal: Negative.  Negative for blood in stool, constipation, diarrhea, melena, nausea and vomiting.  Genitourinary: Negative.    Musculoskeletal: Negative.   Skin: Negative.   Neurological: Negative.  Negative for weakness.  Endo/Heme/Allergies: Negative.   Psychiatric/Behavioral: Negative.     Past Medical History:  Diagnosis Date  . Aortic atherosclerosis ([place])   . COPD (chronic obstructive pulmonary disease) ([place])    per CT findings 2019  . Family history of brain cancer   . Family history of colon cancer   . Family history of kidney cancer   . Family history of leukemia   . Family history of lung cancer   . Family history of ovarian cancer   . Family history of prostate cancer   . Family history of stomach cancer   . GERD (gastroesophageal reflux disease)   . Hepatitis C    referred to hepatitis clinic 2019 but he never went  . Hyperlipidemia   . Hypertension   . Port-A-Cath in place 04/17/2019  . Smoker   . Wears glasses      PHYSICAL EXAMINATION  ECOG PERFORMANCE STATUS: 1 - Symptomatic but completely ambulatory  Vitals:   07/02/19 0751  BP: 113/63  Pulse: (!) 56  Resp: 18  Temp: (!) 97.5 F (36.4 C)  SpO2: 100%    GENERAL:alert, no distress, well nourished, well developed, comfortable, cooperative, smiling and accompanied by significant SKIN: skin color, texture, turgor are normal, no rashes or significant lesions HEAD: Normocephalic, No masses, lesions, tenderness or abnormalities EYES: normal, Conjunctiva are pink and non-injected EARS: External ears normal OROPHARYNX: Not examined, mask in place NECK: supple, no adenopathy LYMPH:  no palpable lymphadenopathy BREAST:not examined LUNGS: clear to auscultation and percussion, decreased breath sounds HEART: regular rate & rhythm ABDOMEN:abdomen soft and normal bowel sounds BACK: Back symmetric, no  curvature. EXTREMITIES:less then 2 second capillary refill, no joint deformities, effusion, or inflammation, no skin discoloration, no cyanosis  NEURO: alert & oriented x 3 with fluent speech, no focal motor/sensory deficits, gait  normal   LABORATORY DATA: CBC    Component Value Date/Time   WBC 14.2 (H) 07/02/2019 0819   RBC 2.96 (L) 07/02/2019 0819   HGB 10.2 (L) 07/02/2019 0819   HGB 14.7 08/10/2018 0927   HCT 31.2 (L) 07/02/2019 0819   HCT 43.9 08/10/2018 0927   PLT 143 (L) 07/02/2019 0819   PLT 252 08/10/2018 0927   MCV 105.4 (H) 07/02/2019 0819   MCV 101 (H) 08/10/2018 0927   MCH 34.5 (H) 07/02/2019 0819   MCHC 32.7 07/02/2019 0819   RDW 16.3 (H) 07/02/2019 0819   RDW 12.0 08/10/2018 0927   LYMPHSABS 3.0 07/02/2019 0819   LYMPHSABS 4.5 (H) 08/10/2018 0927   MONOABS 1.2 (H) 07/02/2019 0819   EOSABS 0.2 07/02/2019 0819   EOSABS 0.1 08/10/2018 0927   BASOSABS 0.1 07/02/2019 0819   BASOSABS 0.1 08/10/2018 0927      Chemistry      Component Value Date/Time   NA 138 07/02/2019 0819   NA 139 01/23/2019 1538   K 4.0 07/02/2019 0819   CL 107 07/02/2019 0819   CO2 24 07/02/2019 0819   BUN 15 07/02/2019 0819   BUN 15 01/23/2019 1538   CREATININE 0.75 07/02/2019 0819   CREATININE 0.84 05/17/2016 0712      Component Value Date/Time   CALCIUM 8.6 (L) 07/02/2019 0819   ALKPHOS 115 07/02/2019 0819   AST 35 07/02/2019 0819   ALT 38 07/02/2019 0819   BILITOT 0.5 07/02/2019 0819   BILITOT 0.4 01/23/2019 1538       RADIOGRAPHIC STUDIES:  No results found.     ASSESSMENT AND PLAN:  1. Malignant neoplasm of head of pancreas [facility]) Pancreatic adenocarcinoma (UT 4 N1 MX): -PET scan on 03/28/2019 did not show any evidence of metastatic disease. -CA 19-9 was 40 on 02/23/2019. -Germline mutation testing was negative. -FOLFIRINOX started on 04/23/2019.  Labs updated today: WBC 14.2 with neutrophilia of 9.2 monocytosis 1.2, hemoglobin 10.2 g/dL is macrocytic with an MCV of 105.4, and minimal thrombocytopenia 143,000.  Metabolic panel relatively unimpressive with a minimal hypoalbuminemia, hypocalcemia, hypoproteinemia and hyperglycemia 111.  Hepatic function is within normal limits.  Lab satisfy  treatment parameters.  Today is D1C6 of FOLFIRINOX  Nursing, in accordance with chemotherapy administration protocol, will monitor for acute side effects/toxicities associated with chemotherapy administration today.  Orders are placed for CT abd/pelvis w contrast per pancreatic protocol in 1-2 weeks.  We will also add a CT of chest to rule out metastatic disease as this would change surgical intervention if identified.  Return in 2 weeks for follow-up, at which time we will review imaging results.  2. Epigastric pain Stable to improved.  Secondary to pancreatic cancer.  3. Chronic obstructive pulmonary disease, unspecified COPD type ([place]) Stable.  Still smoking 1 pack/day.  Smoking cessation education provided.  4. Cirrhosis of liver without ascites, unspecified hepatic cirrhosis type ([place]) Noted on imaging  5. Chronic hepatitis C without hepatic coma (HCC) Followed by GI.   ORDERS PLACED FOR THIS ENCOUNTER: Orders Placed This Encounter  Procedures  . CT Abdomen Pelvis W Contrast  . CT Chest W Contrast    MEDICATIONS PRESCRIBED THIS ENCOUNTER: No orders of the defined types were placed in this encounter.   All questions were answered. The patient knows  to call the clinic with any problems, questions or concerns. We can certainly see the patient much sooner if necessary.  Patient and plan discussed with Dr. Derek Jack and he is in agreement with the aforementioned.   This note is electronically signed by: Robynn Pane, PA-C 07/02/2019 9:18 AM

## 2019-07-02 NOTE — Patient Instructions (Signed)
[facility] Discharge Instructions for Patients Receiving Chemotherapy   Beginning January 23rd 2017 lab work for the [facility] will be done in the  Main lab at [facility] on 1st floor. If you have a lab appointment with the [facility] please come in thru the  Main Entrance and check in at the main information desk   Today you received the following chemotherapy agents FOLFIRINOX  To help prevent nausea and vomiting after your treatment, we encourage you to take your nausea medication If you develop nausea and vomiting, or diarrhea that is not controlled by your medication, call the clinic.  The clinic phone number is [phone]. Office hours are Monday-Friday 8:30am-5:00pm.  BELOW ARE SYMPTOMS THAT SHOULD BE REPORTED IMMEDIATELY:  *FEVER GREATER THAN 101.0 F  *CHILLS WITH OR WITHOUT FEVER  NAUSEA AND VOMITING THAT IS NOT CONTROLLED WITH YOUR NAUSEA MEDICATION  *UNUSUAL SHORTNESS OF BREATH  *UNUSUAL BRUISING OR BLEEDING  TENDERNESS IN MOUTH AND THROAT WITH OR WITHOUT PRESENCE OF ULCERS  *URINARY PROBLEMS  *BOWEL PROBLEMS  UNUSUAL RASH Items with * indicate a potential emergency and should be followed up as soon as possible. If you have an emergency after office hours please contact your primary care physician or go to the nearest emergency department.  Please call the clinic during office hours if you have any questions or concerns.   You may also contact the Patient Navigator at [phone] should you have any questions or need assistance in obtaining follow up care.      Resources For Cancer Patients and their Caregivers ? American Cancer Society: Can assist with transportation, wigs, general needs, runs Look Good Feel Better.        5614203918 ? Cancer Care: Provides financial assistance, online support groups, medication/co-pay assistance.  1-800-813-HOPE 854-662-6970) ? [facility] Assists [place] cancer  patients and their families through emotional , educational and financial support.  (807)216-7882 ? [place] DSS Where to apply for food stamps, Medicaid and utility assistance. 617-086-4584 ? [employer]: Transportation to medical appointments. 7145171630 ? Social Security Administration: May apply for disability if have a Stage IV cancer. 401-731-0377 (671) 777-9227 ? [employer], Disability and Transit Services: Assists with nutrition, care and transit needs. 6506335604

## 2019-07-04 ENCOUNTER — Inpatient Hospital Stay (HOSPITAL_COMMUNITY): Payer: BC Managed Care – PPO | Attending: Hematology

## 2019-07-04 ENCOUNTER — Encounter (HOSPITAL_COMMUNITY): Payer: Self-pay

## 2019-07-04 VITALS — BP 124/73 | HR 61 | Resp 18

## 2019-07-04 DIAGNOSIS — K7460 Unspecified cirrhosis of liver: Secondary | ICD-10-CM | POA: Insufficient documentation

## 2019-07-04 DIAGNOSIS — B1920 Unspecified viral hepatitis C without hepatic coma: Secondary | ICD-10-CM | POA: Insufficient documentation

## 2019-07-04 DIAGNOSIS — R200 Anesthesia of skin: Secondary | ICD-10-CM | POA: Diagnosis not present

## 2019-07-04 DIAGNOSIS — J449 Chronic obstructive pulmonary disease, unspecified: Secondary | ICD-10-CM | POA: Diagnosis not present

## 2019-07-04 DIAGNOSIS — Z5189 Encounter for other specified aftercare: Secondary | ICD-10-CM | POA: Diagnosis not present

## 2019-07-04 DIAGNOSIS — R1013 Epigastric pain: Secondary | ICD-10-CM | POA: Diagnosis not present

## 2019-07-04 DIAGNOSIS — Z8041 Family history of malignant neoplasm of ovary: Secondary | ICD-10-CM | POA: Diagnosis not present

## 2019-07-04 DIAGNOSIS — C251 Malignant neoplasm of body of pancreas: Secondary | ICD-10-CM | POA: Insufficient documentation

## 2019-07-04 DIAGNOSIS — Z806 Family history of leukemia: Secondary | ICD-10-CM | POA: Diagnosis not present

## 2019-07-04 DIAGNOSIS — R202 Paresthesia of skin: Secondary | ICD-10-CM | POA: Insufficient documentation

## 2019-07-04 DIAGNOSIS — Z800 Family history of malignant neoplasm of digestive organs: Secondary | ICD-10-CM | POA: Diagnosis not present

## 2019-07-04 DIAGNOSIS — Z95828 Presence of other vascular implants and grafts: Secondary | ICD-10-CM

## 2019-07-04 DIAGNOSIS — Z801 Family history of malignant neoplasm of trachea, bronchus and lung: Secondary | ICD-10-CM | POA: Diagnosis not present

## 2019-07-04 DIAGNOSIS — Z5111 Encounter for antineoplastic chemotherapy: Secondary | ICD-10-CM | POA: Diagnosis not present

## 2019-07-04 DIAGNOSIS — Z808 Family history of malignant neoplasm of other organs or systems: Secondary | ICD-10-CM | POA: Insufficient documentation

## 2019-07-04 DIAGNOSIS — K769 Liver disease, unspecified: Secondary | ICD-10-CM | POA: Diagnosis not present

## 2019-07-04 DIAGNOSIS — F17210 Nicotine dependence, cigarettes, uncomplicated: Secondary | ICD-10-CM | POA: Insufficient documentation

## 2019-07-04 DIAGNOSIS — Z8042 Family history of malignant neoplasm of prostate: Secondary | ICD-10-CM | POA: Insufficient documentation

## 2019-07-04 DIAGNOSIS — C250 Malignant neoplasm of head of pancreas: Secondary | ICD-10-CM

## 2019-07-04 MED ORDER — PEGFILGRASTIM-CBQV 6 MG/0.6ML ~~LOC~~ SOSY
PREFILLED_SYRINGE | SUBCUTANEOUS | Status: AC
  Filled 2019-07-04: qty 0.6

## 2019-07-04 MED ORDER — PEGFILGRASTIM-CBQV 6 MG/0.6ML ~~LOC~~ SOSY
6.0000 mg | PREFILLED_SYRINGE | Freq: Once | SUBCUTANEOUS | Status: AC
  Administered 2019-07-04: 6 mg via SUBCUTANEOUS

## 2019-07-04 MED ORDER — HEPARIN SOD (PORK) LOCK FLUSH 100 UNIT/ML IV SOLN
500.0000 [IU] | Freq: Once | INTRAVENOUS | Status: AC | PRN
  Administered 2019-07-04: 500 [IU]

## 2019-07-04 MED ORDER — SODIUM CHLORIDE 0.9% FLUSH
10.0000 mL | INTRAVENOUS | Status: DC | PRN
  Administered 2019-07-04: 10 mL

## 2019-07-04 NOTE — Progress Notes (Signed)
Pt here today for pump D/C. Pt's port flushed per protocol. Pt tolerated well with no complaints. Pt was also given a Udenyca injection in his right arm with no complaints. Pt stable and discharged home ambulatory with his sister. He will return as scheduled.

## 2019-07-09 ENCOUNTER — Other Ambulatory Visit: Payer: BC Managed Care – PPO

## 2019-07-11 ENCOUNTER — Encounter (HOSPITAL_COMMUNITY): Payer: Self-pay | Admitting: *Deleted

## 2019-07-11 ENCOUNTER — Ambulatory Visit (HOSPITAL_COMMUNITY)
Admission: RE | Admit: 2019-07-11 | Discharge: 2019-07-11 | Disposition: A | Discharge status: Home / Self Care | Payer: BC Managed Care – PPO | Source: Ambulatory Visit | Attending: Hematology | Admitting: Hematology

## 2019-07-11 ENCOUNTER — Other Ambulatory Visit: Payer: Self-pay

## 2019-07-11 ENCOUNTER — Other Ambulatory Visit (HOSPITAL_COMMUNITY): Payer: Self-pay | Admitting: *Deleted

## 2019-07-11 DIAGNOSIS — C250 Malignant neoplasm of head of pancreas: Secondary | ICD-10-CM

## 2019-07-11 DIAGNOSIS — I700 Atherosclerosis of aorta: Secondary | ICD-10-CM | POA: Diagnosis not present

## 2019-07-11 DIAGNOSIS — K6389 Other specified diseases of intestine: Secondary | ICD-10-CM | POA: Diagnosis not present

## 2019-07-11 DIAGNOSIS — K828 Other specified diseases of gallbladder: Secondary | ICD-10-CM | POA: Diagnosis not present

## 2019-07-11 DIAGNOSIS — J984 Other disorders of lung: Secondary | ICD-10-CM | POA: Diagnosis not present

## 2019-07-11 DIAGNOSIS — I2510 Atherosclerotic heart disease of native coronary artery without angina pectoris: Secondary | ICD-10-CM | POA: Diagnosis not present

## 2019-07-11 DIAGNOSIS — K8689 Other specified diseases of pancreas: Secondary | ICD-10-CM | POA: Diagnosis not present

## 2019-07-11 DIAGNOSIS — M47816 Spondylosis without myelopathy or radiculopathy, lumbar region: Secondary | ICD-10-CM | POA: Diagnosis not present

## 2019-07-11 MED ORDER — HYDROCODONE-ACETAMINOPHEN 5-325 MG PO TABS: 1.0000 | ORAL_TABLET | ORAL | 0 refills | Status: DC | PRN

## 2019-07-11 MED ORDER — IOHEXOL 300 MG/ML  SOLN
100.0000 mL | Freq: Once | INTRAMUSCULAR | Status: AC | PRN
  Administered 2019-07-11: 100 mL via INTRAVENOUS

## 2019-07-15 ENCOUNTER — Other Ambulatory Visit (HOSPITAL_COMMUNITY): Payer: Self-pay | Admitting: Hematology

## 2019-07-15 DIAGNOSIS — G629 Polyneuropathy, unspecified: Secondary | ICD-10-CM

## 2019-07-16 ENCOUNTER — Other Ambulatory Visit (HOSPITAL_COMMUNITY): Payer: Self-pay | Admitting: Nurse Practitioner

## 2019-07-16 ENCOUNTER — Inpatient Hospital Stay (HOSPITAL_COMMUNITY): Payer: BC Managed Care – PPO

## 2019-07-16 ENCOUNTER — Inpatient Hospital Stay (HOSPITAL_BASED_OUTPATIENT_CLINIC_OR_DEPARTMENT_OTHER): Payer: BC Managed Care – PPO | Admitting: Hematology

## 2019-07-16 ENCOUNTER — Other Ambulatory Visit: Payer: Self-pay

## 2019-07-16 ENCOUNTER — Encounter (HOSPITAL_COMMUNITY): Payer: Self-pay | Admitting: *Deleted

## 2019-07-16 VITALS — BP 96/54 | HR 64 | Temp 97.6°F | Resp 18 | Wt 157.7 lb

## 2019-07-16 DIAGNOSIS — K769 Liver disease, unspecified: Secondary | ICD-10-CM | POA: Diagnosis not present

## 2019-07-16 DIAGNOSIS — B1920 Unspecified viral hepatitis C without hepatic coma: Secondary | ICD-10-CM | POA: Diagnosis not present

## 2019-07-16 DIAGNOSIS — F17210 Nicotine dependence, cigarettes, uncomplicated: Secondary | ICD-10-CM | POA: Diagnosis not present

## 2019-07-16 DIAGNOSIS — R202 Paresthesia of skin: Secondary | ICD-10-CM | POA: Diagnosis not present

## 2019-07-16 DIAGNOSIS — Z95828 Presence of other vascular implants and grafts: Secondary | ICD-10-CM

## 2019-07-16 DIAGNOSIS — Z5189 Encounter for other specified aftercare: Secondary | ICD-10-CM | POA: Diagnosis not present

## 2019-07-16 DIAGNOSIS — C250 Malignant neoplasm of head of pancreas: Secondary | ICD-10-CM | POA: Diagnosis not present

## 2019-07-16 DIAGNOSIS — Z5111 Encounter for antineoplastic chemotherapy: Secondary | ICD-10-CM | POA: Diagnosis not present

## 2019-07-16 DIAGNOSIS — R200 Anesthesia of skin: Secondary | ICD-10-CM | POA: Diagnosis not present

## 2019-07-16 DIAGNOSIS — J449 Chronic obstructive pulmonary disease, unspecified: Secondary | ICD-10-CM | POA: Diagnosis not present

## 2019-07-16 DIAGNOSIS — G629 Polyneuropathy, unspecified: Secondary | ICD-10-CM

## 2019-07-16 DIAGNOSIS — Z808 Family history of malignant neoplasm of other organs or systems: Secondary | ICD-10-CM | POA: Diagnosis not present

## 2019-07-16 DIAGNOSIS — R1013 Epigastric pain: Secondary | ICD-10-CM | POA: Diagnosis not present

## 2019-07-16 DIAGNOSIS — Z806 Family history of leukemia: Secondary | ICD-10-CM | POA: Diagnosis not present

## 2019-07-16 DIAGNOSIS — Z8041 Family history of malignant neoplasm of ovary: Secondary | ICD-10-CM | POA: Diagnosis not present

## 2019-07-16 DIAGNOSIS — K7460 Unspecified cirrhosis of liver: Secondary | ICD-10-CM | POA: Diagnosis not present

## 2019-07-16 DIAGNOSIS — C251 Malignant neoplasm of body of pancreas: Secondary | ICD-10-CM | POA: Diagnosis not present

## 2019-07-16 DIAGNOSIS — Z800 Family history of malignant neoplasm of digestive organs: Secondary | ICD-10-CM | POA: Diagnosis not present

## 2019-07-16 DIAGNOSIS — Z801 Family history of malignant neoplasm of trachea, bronchus and lung: Secondary | ICD-10-CM | POA: Diagnosis not present

## 2019-07-16 LAB — CBC WITH DIFFERENTIAL/PLATELET
Abs Immature Granulocytes: 0.3 10*3/uL — ABNORMAL HIGH (ref 0.00–0.07)
Basophils Absolute: 0.1 10*3/uL (ref 0.0–0.1)
Basophils Relative: 1 %
Eosinophils Absolute: 0.2 10*3/uL (ref 0.0–0.5)
Eosinophils Relative: 1 %
HCT: 33.3 % — ABNORMAL LOW (ref 39.0–52.0)
Hemoglobin: 10.9 g/dL — ABNORMAL LOW (ref 13.0–17.0)
Immature Granulocytes: 2 %
Lymphocytes Relative: 20 %
Lymphs Abs: 3.4 10*3/uL (ref 0.7–4.0)
MCH: 34.9 pg — ABNORMAL HIGH (ref 26.0–34.0)
MCHC: 32.7 g/dL (ref 30.0–36.0)
MCV: 106.7 fL — ABNORMAL HIGH (ref 80.0–100.0)
Monocytes Absolute: 1.4 10*3/uL — ABNORMAL HIGH (ref 0.1–1.0)
Monocytes Relative: 8 %
Neutro Abs: 11.9 10*3/uL — ABNORMAL HIGH (ref 1.7–7.7)
Neutrophils Relative %: 68 %
Platelets: 177 10*3/uL (ref 150–400)
RBC: 3.12 MIL/uL — ABNORMAL LOW (ref 4.22–5.81)
RDW: 16.8 % — ABNORMAL HIGH (ref 11.5–15.5)
WBC: 17.3 10*3/uL — ABNORMAL HIGH (ref 4.0–10.5)
nRBC: 0 % (ref 0.0–0.2)

## 2019-07-16 LAB — COMPREHENSIVE METABOLIC PANEL
ALT: 37 U/L (ref 0–44)
AST: 33 U/L (ref 15–41)
Albumin: 3.3 g/dL — ABNORMAL LOW (ref 3.5–5.0)
Alkaline Phosphatase: 119 U/L (ref 38–126)
Anion gap: 5 (ref 5–15)
BUN: 10 mg/dL (ref 8–23)
CO2: 24 mmol/L (ref 22–32)
Calcium: 8.6 mg/dL — ABNORMAL LOW (ref 8.9–10.3)
Chloride: 109 mmol/L (ref 98–111)
Creatinine, Ser: 0.66 mg/dL (ref 0.61–1.24)
GFR calc Af Amer: >60 mL/min (ref >60)
GFR calc non Af Amer: >60 mL/min (ref >60)
Glucose, Bld: 99 mg/dL (ref 70–99)
Potassium: 3.6 mmol/L (ref 3.5–5.1)
Sodium: 138 mmol/L (ref 135–145)
Total Bilirubin: 0.6 mg/dL (ref 0.3–1.2)
Total Protein: 6.7 g/dL (ref 6.5–8.1)

## 2019-07-16 MED ORDER — HEPARIN SOD (PORK) LOCK FLUSH 100 UNIT/ML IV SOLN
500.0000 [IU] | Freq: Once | INTRAVENOUS | Status: AC
  Administered 2019-07-16: 500 [IU] via INTRAVENOUS

## 2019-07-16 MED ORDER — OXYCODONE HCL 10 MG PO TABS: 10.0000 mg | ORAL_TABLET | ORAL | 0 refills | Status: DC | PRN

## 2019-07-16 MED ORDER — SODIUM CHLORIDE 0.9% FLUSH
10.0000 mL | Freq: Once | INTRAVENOUS | Status: AC
  Administered 2019-07-16: 10 mL via INTRAVENOUS

## 2019-07-16 NOTE — Patient Instructions (Signed)
[place] at [facility]  Discharge Instructions:  No chemo today. _______________________________________________________________  Thank you for choosing [place] at [facility] to provide your oncology and hematology care.  To afford each patient quality time with our providers, please arrive at least 15 minutes before your scheduled appointment.  You need to re-schedule your appointment if you arrive 10 or more minutes late.  We strive to give you quality time with our providers, and arriving late affects you and other patients whose appointments are after yours.  Also, if you no show three or more times for appointments you may be dismissed from the clinic.  Again, thank you for choosing [place] at [facility] hope is that these requests will allow you access to exceptional care and in a timely manner. _______________________________________________________________  If you have questions after your visit, please contact our office at [phone] between the hours of 8:30 a.m. and 5:00 p.m. Voicemails left after 4:30 p.m. will not be returned until the following business day. _______________________________________________________________  For prescription refill requests, have your pharmacy contact our office. _______________________________________________________________  Recommendations made by the consultant and any test results will be sent to your referring physician. _______________________________________________________________

## 2019-07-16 NOTE — Progress Notes (Signed)
Patient has been assessed, vital signs and labs have been reviewed by Dr. Delton Coombes.There was slight progression on scan. HOLD Tx today.

## 2019-07-16 NOTE — Patient Instructions (Signed)
[place] at [facility] Discharge Instructions  You were seen today by Dr. Delton Coombes. He went over your recent results and scans. Since you are getting mixed results on the current regimen, Dr. Delton Coombes will adjust your regimen, possibly including chemoradiation therapy. You will be prescribed oxycodone for your pain. Please take Colace daily to soften your stool. Dr. Delton Coombes will see you back in 1 week for follow up.   Thank you for choosing [place] at [facility] to provide your oncology and hematology care.  To afford each patient quality time with our provider, please arrive at least 15 minutes before your scheduled appointment time.   If you have a lab appointment with the [facility] please come in thru the Main Entrance and check in at the main information desk  You need to re-schedule your appointment should you arrive 10 or more minutes late.  We strive to give you quality time with our providers, and arriving late affects you and other patients whose appointments are after yours.  Also, if you no show three or more times for appointments you may be dismissed from the clinic at the providers discretion.     Again, thank you for choosing [facility].  Our hope is that these requests will decrease the amount of time that you wait before being seen by our physicians.       _____________________________________________________________  Should you have questions after your visit to [facility], please contact our office at [phone] between the hours of 8:00 a.m. and 4:30 p.m.  Voicemails left after 4:00 p.m. will not be returned until the following business day.  For prescription refill requests, have your pharmacy contact our office and allow 72 hours.    Cancer Center Support Programs:   > Cancer Support Group  2nd Tuesday of the month 1pm-2pm, Journey Room

## 2019-07-16 NOTE — Progress Notes (Signed)
Duane Alexander, [place] [id number]   CLINIC:  Medical Oncology/Hematology  PCP:  Carlena Hurl, PA-C [address] / [place] [address] [phone]   REASON FOR VISIT:  Follow-up for pancreatic cancer  PRIOR THERAPY: None  CURRENT THERAPY: FOLFIRINOX & Aloxi  BRIEF ONCOLOGIC HISTORY:  Oncology History  Pancreatic cancer ([place])  03/28/2019 Initial Diagnosis   Pancreatic cancer ([place])   03/28/2019 Cancer Staging   Staging form: Exocrine Pancreas, AJCC 8th Edition - Clinical stage from 03/28/2019: Stage III (cT4, cN1, cM0) - Signed by Derek Jack, MD on 03/28/2019   04/20/2019 Genetic Testing   Negative genetic testing:  No pathogenic variants detected on the [facility] Multi-Cancer panel. The report date is 04/20/2019.  The Multi-Cancer Panel offered by [facility] includes sequencing and/or deletion duplication testing of the following 85 genes: AIP, ALK, APC, ATM, AXIN2,BAP1,  BARD1, BLM, BMPR1A, BRCA1, BRCA2, BRIP1, CASR, CDC73, CDH1, CDK4, CDKN1B, CDKN1C, CDKN2A (p14ARF), CDKN2A (p16INK4a), CEBPA, CHEK2, CTNNA1, DICER1, DIS3L2, EGFR (c.2369C>T, p.Thr790Met variant only), EPCAM (Deletion/duplication testing only), FH, FLCN, GATA2, GPC3, GREM1 (Promoter region deletion/duplication testing only), HOXB13 (c.251G>A, p.Gly84Glu), HRAS, KIT, MAX, MEN1, MET, MITF (c.952G>A, p.Glu318Lys variant only), MLH1, MSH2, MSH3, MSH6, MUTYH, NBN, NF1, NF2, NTHL1, PALB2, PDGFRA, PHOX2B, PMS2, POLD1, POLE, POT1, PRKAR1A, PTCH1, PTEN, RAD50, RAD51C, RAD51D, RB1, RECQL4, RET, RNF43, RUNX1, SDHAF2, SDHA (sequence changes only), SDHB, SDHC, SDHD, SMAD4, SMARCA4, SMARCB1, SMARCE1, STK11, SUFU, TERC, TERT, TMEM127, TP53, TSC1, TSC2, VHL, WRN and WT1.   04/23/2019 -  Chemotherapy   The patient had palonosetron (ALOXI) injection 0.25 mg, 0.25 mg, Intravenous,  Once, 6 of 8 cycles Administration: 0.25 mg (04/23/2019), 0.25 mg (05/07/2019), 0.25 mg (05/21/2019), 0.25 mg  (06/04/2019), 0.25 mg (06/18/2019), 0.25 mg (07/02/2019) pegfilgrastim-cbqv (UDENYCA) injection 6 mg, 6 mg, Subcutaneous, Once, 6 of 8 cycles Administration: 6 mg (04/25/2019), 6 mg (05/09/2019), 6 mg (05/23/2019), 6 mg (06/06/2019), 6 mg (06/20/2019) irinotecan (CAMPTOSAR) 240 mg in sodium chloride 0.9 % 500 mL chemo infusion, 120 mg/m2 = 240 mg (80 % of original dose 150 mg/m2), Intravenous,  Once, 6 of 8 cycles Dose modification: 120 mg/m2 (80 % of original dose 150 mg/m2, Cycle 1, Reason: Other (see comments), Comment: cirrhosis) Administration: 240 mg (04/23/2019), 280 mg (05/07/2019), 280 mg (05/21/2019), 280 mg (06/04/2019), 280 mg (06/18/2019), 280 mg (07/02/2019) oxaliplatin (ELOXATIN) 130 mg in dextrose 5 % 500 mL chemo infusion, 68 mg/m2 = 130 mg (80 % of original dose 85 mg/m2), Intravenous,  Once, 6 of 8 cycles Dose modification: 68 mg/m2 (80 % of original dose 85 mg/m2, Cycle 1, Reason: Other (see comments), Comment: cirrhosis), 68 mg/m2 (80 % of original dose 85 mg/m2, Cycle 2, Reason: Provider Judgment), 68 mg/m2 (80 % of original dose 85 mg/m2, Cycle 3, Reason: Other (see comments), Comment: neuropathy), 55 mg/m2 (original dose 85 mg/m2, Cycle 5, Reason: Other (see comments), Comment: worsening neuropathy) Administration: 130 mg (04/23/2019), 130 mg (05/07/2019), 130 mg (05/21/2019), 130 mg (06/04/2019), 100 mg (06/18/2019), 100 mg (07/02/2019) fosaprepitant (EMEND) 150 mg in sodium chloride 0.9 % 145 mL IVPB, 150 mg, Intravenous,  Once, 6 of 8 cycles Administration: 150 mg (04/23/2019), 150 mg (05/07/2019), 150 mg (05/21/2019), 150 mg (06/04/2019), 150 mg (06/18/2019), 150 mg (07/02/2019) fluorouracil (ADRUCIL) 3,700 mg in sodium chloride 0.9 % 76 mL chemo infusion, 1,920 mg/m2 = 3,700 mg (80 % of original dose 2,400 mg/m2), Intravenous, 1 Day/Dose, 6 of 8 cycles Dose modification: 1,920 mg/m2 (80 % of original dose 2,400 mg/m2,  Cycle 1, Reason: Other (see comments), Comment: cirrhosis) Administration: 3,700 mg  (04/23/2019), 4,650 mg (05/07/2019), 4,650 mg (05/21/2019), 4,650 mg (06/04/2019), 4,650 mg (06/18/2019), 4,650 mg (07/02/2019) leucovorin 618 mg in sodium chloride 0.9 % 250 mL infusion, 320 mg/m2 = 618 mg (80 % of original dose 400 mg/m2), Intravenous,  Once, 6 of 8 cycles Dose modification: 320 mg/m2 (80 % of original dose 400 mg/m2, Cycle 1, Reason: Other (see comments), Comment: cirrhosis) Administration: 618 mg (04/23/2019), 772 mg (05/07/2019), 772 mg (05/21/2019), 772 mg (06/04/2019), 772 mg (06/18/2019), 772 mg (07/02/2019)  for chemotherapy treatment.      CANCER STAGING: Cancer Staging Pancreatic cancer [facility]) Staging form: Exocrine Pancreas, AJCC 8th Edition - Clinical stage from 03/28/2019: Stage III (cT4, cN1, cM0) - Signed by Doreatha Massed, MD on 03/28/2019   INTERVAL HISTORY:  [name], a 64 y.o. male, returns for routine follow-up and consideration for next cycle of chemotherapy. Senai was last seen on 06/18/2019.  Due for cycle #7 of FOLFIRINOX and Aloxi today.   Today he is accompanied by his wife. He reports that he stopped taking the gabapentin because it was making him fatigued. He continues taking Norco 4 tablets every 4 hours for his pain and Imodium for his diarrhea which comes from the chemo, but then he gets abdominal cramps. He reports that the lower abdominal pain has gotten worse, especially worse when he is constipated, but is not radiating to his back. He is taking Miralax, but it can occasionally lead to diarrhea.   REVIEW OF SYSTEMS:  Review of Systems  Constitutional: Positive for appetite change (mildly decreased) and fatigue (mild).  Gastrointestinal: Positive for abdominal pain (4/10 lower abdominal pain), constipation and diarrhea.  Neurological: Positive for numbness (hands & feet).  All other systems reviewed and are negative.   PAST MEDICAL/SURGICAL HISTORY:  Past Medical History:  Diagnosis Date  . Aortic atherosclerosis (HCC)   . COPD  (chronic obstructive pulmonary disease) (HCC)    per CT findings 2019  . Family history of brain cancer   . Family history of colon cancer   . Family history of kidney cancer   . Family history of leukemia   . Family history of lung cancer   . Family history of ovarian cancer   . Family history of prostate cancer   . Family history of stomach cancer   . GERD (gastroesophageal reflux disease)   . Hepatitis C    referred to hepatitis clinic 2019 but he never went  . Hyperlipidemia   . Hypertension   . Port-A-Cath in place 04/17/2019  . Smoker   . Wears glasses    Past Surgical History:  Procedure Laterality Date  . ANTERIOR CERVICAL DECOMP/DISCECTOMY FUSION  12/2017   Dr. Lisbeth Renshaw  . BIOPSY  03/11/2019   Procedure: BIOPSY;  Surgeon: Lemar Lofty., MD;  Location[facility] ENDOSCOPY;  Service: Gastroenterology;;  . COLONOSCOPY N/A 01/19/2017   Procedure: COLONOSCOPY;  Surgeon: Malissa Hippo, MD;  Location: [facility] ENDO SUITE;  Service: Endoscopy;  Laterality: N/A;  930-moved to 9:45 per Dewayne Hatch  . ESOPHAGOGASTRODUODENOSCOPY (EGD) WITH PROPOFOL N/A 03/11/2019   Procedure: ESOPHAGOGASTRODUODENOSCOPY (EGD) WITH PROPOFOL;  Surgeon: Meridee Score Netty Starring., MD;  Location: [facility] ENDOSCOPY;  Service: Gastroenterology;  Laterality: N/A;  . EUS N/A 03/11/2019   Procedure: UPPER ENDOSCOPIC ULTRASOUND (EUS) RADIAL;  Surgeon: Meridee Score Netty Starring., MD;  Location: [facility] ENDOSCOPY;  Service: Gastroenterology;  Laterality: N/A;  . FINE NEEDLE ASPIRATION  03/11/2019   Procedure: FINE  NEEDLE ASPIRATION (FNA) LINEAR;  Surgeon: [name]., MD;  Location: [facility];  Service: Gastroenterology;;  . POLYPECTOMY  01/19/2017   Procedure: POLYPECTOMY;  Surgeon: Rogene Houston, MD;  Location: [facility] ENDO SUITE;  Service: Endoscopy;;  splenic flexure  . POLYPECTOMY  03/11/2019   Procedure: POLYPECTOMY;  Surgeon: Mansouraty, Telford Nab., MD;  Location: [facility];  Service: Gastroenterology;;  . Sol Passer  PLACEMENT Left 04/15/2019   Procedure: INSERTION PORT-A-CATH;  Surgeon: Aviva Signs, MD;  Location: [facility] ORS;  Service: General;  Laterality: Left;  . Right eye surgery     as a child-removed muscle from leg and put in upper eye lid    SOCIAL HISTORY:  Social History   Socioeconomic History  . Marital status: Widowed    Spouse name: Not on file  . Number of children: Not on file  . Years of education: Not on file  . Highest education level: Not on file  Occupational History    Employer: [employer]  Tobacco Use  . Smoking status: Current Every Day Smoker    Packs/day: 0.50    Years: 45.00    Pack years: 22.50    Types: Cigarettes  . Smokeless tobacco: Never Used  Vaping Use  . Vaping Use: Never used  Substance and Sexual Activity  . Alcohol use: Not Currently  . Drug use: No  . Sexual activity: Not Currently  Other Topics Concern  . Not on file  Social History Narrative  . Not on file   Social Determinants of Health   Financial Resource Strain: Low Risk   . Difficulty of Paying Living Expenses: Not hard at all  Food Insecurity: No Food Insecurity  . Worried About Charity fundraiser in the Last Year: Never true  . Ran Out of Food in the Last Year: Never true  Transportation Needs: No Transportation Needs  . Lack of Transportation (Medical): No  . Lack of Transportation (Non-Medical): No  Physical Activity: Inactive  . Days of Exercise per Week: 0 days  . Minutes of Exercise per Session: 0 min  Stress: Stress Concern Present  . Feeling of Stress : Rather much  Social Connections: Socially Isolated  . Frequency of Communication with Friends and Family: Three times a week  . Frequency of Social Gatherings with Friends and Family: Three times a week  . Attends Religious Services: Never  . Active Member of Clubs or Organizations: No  . Attends Archivist Meetings: Never  . Marital Status: Widowed  Intimate Partner Violence: Not At Risk  . Fear of  Current or Ex-Partner: No  . Emotionally Abused: No  . Physically Abused: No  . Sexually Abused: No    FAMILY HISTORY:  Family History  Problem Relation Age of Onset  . Throat cancer Father 21       died in his 14s of stomach issues?  . Lung cancer Brother 75       smoker, non-small cell  . Ovarian cancer Sister 24       GI related death  . Heart attack Maternal Grandmother   . Brain cancer Maternal Aunt        dx. in her 34s  . Lung cancer Maternal Uncle        dx. in his 48s-60s, smoker  . Stomach cancer Maternal Aunt        dx. in her 94s  . Colon cancer Cousin        dx. >50 - maternal  cousin  . Kidney cancer Cousin        dx. in his 15s - maternal cousin  . Prostate cancer Cousin        dx. in his 57s - maternal cousin  . Leukemia Nephew 34  . Heart disease Neg Hx   . Hypertension Neg Hx   . Stroke Neg Hx     CURRENT MEDICATIONS:  Current Outpatient Medications  Medication Sig Dispense Refill  . docusate sodium (COLACE) 100 MG capsule Take 100 mg by mouth daily.    . fluorouracil CALGB 01385 in sodium chloride 0.9 % 150 mL Inject 2,400 mg/m2 into the vein over 48 hr.    . FLUOROURACIL IV Inject into the vein every 21 ( twenty-one) days.    [name] HYDROcodone-acetaminophen (NORCO/VICODIN) 5-325 MG tablet Take 1 tablet by mouth every 4 (four) hours as needed for moderate pain. 84 tablet 0  . IRINOTECAN HCL IV Inject into the vein every 14 (fourteen) days.    [name] LEUCOVORIN CALCIUM IV Inject into the vein every 14 (fourteen) days.    [name] lisinopril (ZESTRIL) 20 MG tablet TAKE 1 TABLET BY MOUTH DAILY 30 tablet 0  . lovastatin (MEVACOR) 20 MG tablet TAKE 1 TABLET BY MOUTH AT BEDTIME 30 tablet 0  . melatonin 5 MG TABS Take 5 mg by mouth at bedtime.    . Multiple Vitamin (MULTIVITAMIN) tablet Take 1 tablet by mouth daily.    . OXALIPLATIN IV Inject into the vein every 14 (fourteen) days.    [name] lidocaine-prilocaine (EMLA) cream Apply a small amount to port a cath site and cover  with plastic wrap one hour prior to chemotherapy appointments (Patient not taking: Reported on 07/16/2019) 30 g 3  . loperamide (IMODIUM A-D) 2 MG tablet Take 2 at onset of diarrhea, then 1 tab after every watery bowel movement - DO NOT EXCEED 8 tablets in a 24 hour period (Patient not taking: Reported on 07/16/2019) 100 tablet 1  . prochlorperazine (COMPAZINE) 10 MG tablet Take 1 tablet (10 mg total) by mouth every 6 (six) hours as needed (Nausea or vomiting). (Patient not taking: Reported on 07/16/2019) 60 tablet 3   No current facility-administered medications for this visit.    ALLERGIES:  No Known Allergies  PHYSICAL EXAM:  Performance status (ECOG): 1 - Symptomatic but completely ambulatory  Vitals:   07/16/19 0905  BP: (!) 96/54  Pulse: 64  Resp: 18  Temp: 97.6 F (36.4 C)  SpO2: 100%   Wt Readings from Last 3 Encounters:  07/16/19 157 lb 11.2 oz (71.5 kg)  07/02/19 166 lb 3.2 oz (75.4 kg)  06/25/19 150 lb 12.8 oz (68.4 kg)   Physical Exam Vitals reviewed.  Constitutional:      Appearance: Normal appearance.  Cardiovascular:     Rate and Rhythm: Normal rate and regular rhythm.     Pulses: Normal pulses.     Heart sounds: Normal heart sounds.  Pulmonary:     Effort: Pulmonary effort is normal.     Breath sounds: Normal breath sounds.  Abdominal:     Palpations: Abdomen is soft. There is no hepatomegaly, splenomegaly or mass.     Tenderness: There is abdominal tenderness (periumbilical & suprapubic TTP).  Musculoskeletal:     Right lower leg: Edema present.     Left lower leg: Edema present.  Neurological:     General: No focal deficit present.     Mental Status: He is alert and oriented to person, place, and  time.  Psychiatric:        Mood and Affect: Mood normal.        Behavior: Behavior normal.     LABORATORY DATA:  I have reviewed the labs as listed.  CBC Latest Ref Rng & Units 07/16/2019 07/02/2019 06/18/2019  WBC 4.0 - 10.5 K/uL 17.3(H) 14.2(H) 16.5(H)    Hemoglobin 13.0 - 17.0 g/dL 10.9(L) 10.2(L) 11.2(L)  Hematocrit 39 - 52 % 33.3(L) 31.2(L) 33.8(L)  Platelets 150 - 400 K/uL 177 143(L) 155   CMP Latest Ref Rng & Units 07/16/2019 07/02/2019 06/18/2019  Glucose 70 - 99 mg/dL 99 111(H) 109(H)  BUN 8 - 23 mg/dL '10 15 17  '$ Creatinine 0.61 - 1.24 mg/dL 0.66 0.75 0.67  Sodium 135 - 145 mmol/L 138 138 137  Potassium 3.5 - 5.1 mmol/L 3.6 4.0 4.0  Chloride 98 - 111 mmol/L 109 107 106  CO2 22 - 32 mmol/L '24 24 24  '$ Calcium 8.9 - 10.3 mg/dL 8.6(L) 8.6(L) 8.6(L)  Total Protein 6.5 - 8.1 g/dL 6.7 6.3(L) 6.9  Total Bilirubin 0.3 - 1.2 mg/dL 0.6 0.5 0.6  Alkaline Phos 38 - 126 U/L 119 115 115  AST 15 - 41 U/L 33 35 35  ALT 0 - 44 U/L 37 38 43   Lab Results  Component Value Date   CAN199 40 (H) 02/13/2019    DIAGNOSTIC IMAGING:  I have independently reviewed the scans and discussed with the patient. CT Abdomen Pelvis W Wo Contrast  Result Date: 07/12/2019 CLINICAL DATA:  Pancreatic adenocarcinoma, current chemotherapy. EXAM: CT CHEST WITH CONTRAST CT ABDOMEN WITHOUT AND WITH CONTRAST TECHNIQUE: Multidetector CT imaging of the abdomen was performed without intravenous contrast. Multidetector CT imaging of the chest and abdomen was then performed during bolus administration of intravenous contrast. CONTRAST:  161m OMNIPAQUE IOHEXOL 300 MG/ML  SOLN COMPARISON:  Multiple exams, including 03/28/2019 and 03/12/2019 FINDINGS: CT CHEST WITH CONTRAST Cardiovascular: Left Port-A-Cath tip: SVC. Coronary, aortic arch, and branch vessel atherosclerotic vascular disease. Mediastinum/Nodes: Stable left paratracheal lymph nodes are within normal size limits, stable from before, not previously hypermetabolic. A 0.5 cm left internal mammary lymph node is likewise stable and was not previously hypermetabolic. Lungs/Pleura: Mild bilateral airway thickening. Otherwise unremarkable. Musculoskeletal: Thoracic spondylosis. CT ABDOMEN WITHOUT AND WITH CONTRAST Hepatobiliary:  Hepatic morphology suggests early cirrhosis. Borderline wall thickening in the gallbladder. No abnormal arterial phase enhancement in the liver. The common hepatic duct measures up to 1.0 cm in diameter. The proximal common bile duct is indistinct, distal CBD measures 0.6 cm in diameter. Pancreas: Dorsal pancreatic duct dilatation in the pancreatic tail extending to a 2.1 by 2.2 by 3.6 cm hypoenhancing mass in the pancreatic body. Although hypoenhancing on the arterial phase images, the best contrast between the mass and the surrounding pancreatic parenchyma is on the portal venous phase images. This mass appears to have enlarged, by my measurements the main mass was previously 2.1 by 1.6 by 2.0 cm on 03/12/2019. The mass narrows and directly abuts the portal vein at its junction with the splenic vein and SMV, and also narrows the SMV. There is adjacent scattered right gastric and peripancreatic adenopathy with 1 peripancreatic lymph node measuring 1.1 cm in short axis on image 168/11, previously 1.3 cm by my measurements. The scattered small right gastric lymph nodes appear roughly stable. The upper margin of the mass abuts the splenic artery and some of the local lymph nodes are near the common hepatic artery. No variant hepatic artery appearance. Spleen: The  spleen measures 9.8 by 5.1 by 11.4 cm (volume = 300 cm^3), within normal limits. No focal splenic lesion. Adrenals/Urinary Tract: Stable fullness of the lateral limb left adrenal gland without a discrete mass. Right adrenal gland normal. No urinary tract calculi are identified. The urinary bladder appears unremarkable. Stomach/Bowel: The pancreatic mass abuts the posterior margin of the stomach as shown on image 179/11, without definite intact fat plane but also without definite indicators of invasion. Air fluid levels in the distal colon suggesting diarrheal process. Vascular/Lymphatic: Aortoiliac atherosclerotic vascular disease. Mild peripancreatic  adenopathy as noted above. Narrowing of the portal vein at the confluence of the splenic and SMV as noted above. Other: The omentum or trace pelvic ascites. No definite nodularity along mesenteric. Small central mesenteric lymph nodes. Musculoskeletal: Lumbar spondylosis and degenerative disc disease causing mild multilevel impingement. IMPRESSION: 1. Enlarging hypoenhancing mass in the pancreatic body, currently measuring 2.1 by 2.2 by 3.6 cm, with dorsal pancreatic duct dilatation in the pancreatic tail. The mass narrows and directly abuts the portal vein at its junction with the splenic vein and SMV, and also narrows the SMV. 2. Mild peripancreatic adenopathy. 3. Other imaging findings of potential clinical significance: Coronary atherosclerosis. Mild bilateral airway thickening is present, suggesting bronchitis or reactive airways disease. Hepatic morphology suggests early cirrhosis. Lumbar spondylosis and degenerative disc disease causing mild multilevel impingement. Borderline wall thickening in the gallbladder. Air fluid levels in the distal colon suggesting diarrheal process. 4. Aortic atherosclerosis. Aortic Atherosclerosis (ICD10-I70.0). Electronically Signed   By: Van Clines M.D.   On: 07/12/2019 12:19   CT Chest W Contrast  Result Date: 07/12/2019 CLINICAL DATA:  Pancreatic adenocarcinoma, current chemotherapy. EXAM: CT CHEST WITH CONTRAST CT ABDOMEN WITHOUT AND WITH CONTRAST TECHNIQUE: Multidetector CT imaging of the abdomen was performed without intravenous contrast. Multidetector CT imaging of the chest and abdomen was then performed during bolus administration of intravenous contrast. CONTRAST:  160m OMNIPAQUE IOHEXOL 300 MG/ML  SOLN COMPARISON:  Multiple exams, including 03/28/2019 and 03/12/2019 FINDINGS: CT CHEST WITH CONTRAST Cardiovascular: Left Port-A-Cath tip: SVC. Coronary, aortic arch, and branch vessel atherosclerotic vascular disease. Mediastinum/Nodes: Stable left paratracheal  lymph nodes are within normal size limits, stable from before, not previously hypermetabolic. A 0.5 cm left internal mammary lymph node is likewise stable and was not previously hypermetabolic. Lungs/Pleura: Mild bilateral airway thickening. Otherwise unremarkable. Musculoskeletal: Thoracic spondylosis. CT ABDOMEN WITHOUT AND WITH CONTRAST Hepatobiliary: Hepatic morphology suggests early cirrhosis. Borderline wall thickening in the gallbladder. No abnormal arterial phase enhancement in the liver. The common hepatic duct measures up to 1.0 cm in diameter. The proximal common bile duct is indistinct, distal CBD measures 0.6 cm in diameter. Pancreas: Dorsal pancreatic duct dilatation in the pancreatic tail extending to a 2.1 by 2.2 by 3.6 cm hypoenhancing mass in the pancreatic body. Although hypoenhancing on the arterial phase images, the best contrast between the mass and the surrounding pancreatic parenchyma is on the portal venous phase images. This mass appears to have enlarged, by my measurements the main mass was previously 2.1 by 1.6 by 2.0 cm on 03/12/2019. The mass narrows and directly abuts the portal vein at its junction with the splenic vein and SMV, and also narrows the SMV. There is adjacent scattered right gastric and peripancreatic adenopathy with 1 peripancreatic lymph node measuring 1.1 cm in short axis on image 168/11, previously 1.3 cm by my measurements. The scattered small right gastric lymph nodes appear roughly stable. The upper margin of the mass abuts the splenic artery  and some of the local lymph nodes are near the common hepatic artery. No variant hepatic artery appearance. Spleen: The spleen measures 9.8 by 5.1 by 11.4 cm (volume = 300 cm^3), within normal limits. No focal splenic lesion. Adrenals/Urinary Tract: Stable fullness of the lateral limb left adrenal gland without a discrete mass. Right adrenal gland normal. No urinary tract calculi are identified. The urinary bladder appears  unremarkable. Stomach/Bowel: The pancreatic mass abuts the posterior margin of the stomach as shown on image 179/11, without definite intact fat plane but also without definite indicators of invasion. Air fluid levels in the distal colon suggesting diarrheal process. Vascular/Lymphatic: Aortoiliac atherosclerotic vascular disease. Mild peripancreatic adenopathy as noted above. Narrowing of the portal vein at the confluence of the splenic and SMV as noted above. Other: The omentum or trace pelvic ascites. No definite nodularity along mesenteric. Small central mesenteric lymph nodes. Musculoskeletal: Lumbar spondylosis and degenerative disc disease causing mild multilevel impingement. IMPRESSION: 1. Enlarging hypoenhancing mass in the pancreatic body, currently measuring 2.1 by 2.2 by 3.6 cm, with dorsal pancreatic duct dilatation in the pancreatic tail. The mass narrows and directly abuts the portal vein at its junction with the splenic vein and SMV, and also narrows the SMV. 2. Mild peripancreatic adenopathy. 3. Other imaging findings of potential clinical significance: Coronary atherosclerosis. Mild bilateral airway thickening is present, suggesting bronchitis or reactive airways disease. Hepatic morphology suggests early cirrhosis. Lumbar spondylosis and degenerative disc disease causing mild multilevel impingement. Borderline wall thickening in the gallbladder. Air fluid levels in the distal colon suggesting diarrheal process. 4. Aortic atherosclerosis. Aortic Atherosclerosis (ICD10-I70.0). Electronically Signed   By: Van Clines M.D.   On: 07/12/2019 12:19     ASSESSMENT:  1. Pancreatic adenocarcinoma (UT 4 N1 MX): -PET scan on 03/28/2019 did not show any evidence of metastatic disease. -CA 19-9 was 40 on 02/23/2019. -Germline mutation testing was negative. -6 cycles of FOLFIRINOX from 04/23/2019 through 07/02/2019. -CT CAP on 07/11/2019 showed enlarging mass in the pancreatic body measuring 2.1 x 2.2  x 3.6 cm.  The mass narrows and directly abuts the portal vein at its junction with the splenic vein and SMV and also narrows the SMV.  Mild peripancreatic adenopathy more or less stable.  No new lesions.  2. Epigastric pain: -This is fairly improved since the start of treatments. -He is using hydrocodone 5/325 as needed. Some days he does not require any pain medication.  3. Liver disease: -CT scan showed mild degree of cirrhosis. History of hepatitis C which was untreated.   PLAN:  1. Pancreatic adenocarcinoma: -He has completed 6 cycles of chemotherapy.  I reviewed results of the CT scan which showed slight increase in size of the pancreatic body mass. -I will reach out to Dr. Barry Dienes for her opinion.  If there is no role for surgery at this time, will consider changing his chemotherapy regimen to gemcitabine and Abraxane.  This may be followed by chemoradiation therapy. -We will reevaluate him in 1 week.  We will send CA 19-9 level today.  2. Tingling/numbness: -He had numbness in the fingertips and toes from spine problem.  This has worsened after starting chemotherapy.  I have started him on gabapentin.  He stopped taking it as he could not tolerate it.  3. Epigastric pain: -Hydrocodone 5/325 is not helping. -I will start him on oxycodone 10 mg every 4-6 hours as needed.   Orders placed this encounter:  No orders of the defined types were placed in this  encounter.    Derek Jack, MD [place] [phone]   I, Milinda Antis, am acting as a scribe for Dr. Sanda Linger.  I, Derek Jack MD, have reviewed the above documentation for accuracy and completeness, and I agree with the above.

## 2019-07-16 NOTE — Progress Notes (Signed)
No treatment today per MD. Labs reviewed at office visit.   Discharged home from clinic ambulatory. Follow up as scheduled.

## 2019-07-17 LAB — CANCER ANTIGEN 19-9: CA 19-9: 200 U/mL — ABNORMAL HIGH (ref 0–35)

## 2019-07-18 ENCOUNTER — Encounter (HOSPITAL_COMMUNITY): Payer: BC Managed Care – PPO

## 2019-07-23 ENCOUNTER — Other Ambulatory Visit: Payer: Self-pay

## 2019-07-23 ENCOUNTER — Inpatient Hospital Stay (HOSPITAL_COMMUNITY): Payer: BC Managed Care – PPO

## 2019-07-23 ENCOUNTER — Inpatient Hospital Stay (HOSPITAL_BASED_OUTPATIENT_CLINIC_OR_DEPARTMENT_OTHER): Payer: BC Managed Care – PPO | Admitting: Hematology

## 2019-07-23 VITALS — BP 131/76 | HR 61 | Temp 97.2°F | Resp 18

## 2019-07-23 VITALS — BP 121/64 | HR 61 | Temp 97.1°F | Resp 18 | Wt 165.1 lb

## 2019-07-23 DIAGNOSIS — K769 Liver disease, unspecified: Secondary | ICD-10-CM | POA: Diagnosis not present

## 2019-07-23 DIAGNOSIS — R202 Paresthesia of skin: Secondary | ICD-10-CM | POA: Diagnosis not present

## 2019-07-23 DIAGNOSIS — Z800 Family history of malignant neoplasm of digestive organs: Secondary | ICD-10-CM | POA: Diagnosis not present

## 2019-07-23 DIAGNOSIS — Z5111 Encounter for antineoplastic chemotherapy: Secondary | ICD-10-CM | POA: Diagnosis not present

## 2019-07-23 DIAGNOSIS — Z806 Family history of leukemia: Secondary | ICD-10-CM | POA: Diagnosis not present

## 2019-07-23 DIAGNOSIS — C250 Malignant neoplasm of head of pancreas: Secondary | ICD-10-CM

## 2019-07-23 DIAGNOSIS — K7460 Unspecified cirrhosis of liver: Secondary | ICD-10-CM | POA: Diagnosis not present

## 2019-07-23 DIAGNOSIS — R200 Anesthesia of skin: Secondary | ICD-10-CM | POA: Diagnosis not present

## 2019-07-23 DIAGNOSIS — B1920 Unspecified viral hepatitis C without hepatic coma: Secondary | ICD-10-CM | POA: Diagnosis not present

## 2019-07-23 DIAGNOSIS — Z5189 Encounter for other specified aftercare: Secondary | ICD-10-CM | POA: Diagnosis not present

## 2019-07-23 DIAGNOSIS — F17210 Nicotine dependence, cigarettes, uncomplicated: Secondary | ICD-10-CM | POA: Diagnosis not present

## 2019-07-23 DIAGNOSIS — Z95828 Presence of other vascular implants and grafts: Secondary | ICD-10-CM

## 2019-07-23 DIAGNOSIS — R1013 Epigastric pain: Secondary | ICD-10-CM | POA: Diagnosis not present

## 2019-07-23 DIAGNOSIS — C259 Malignant neoplasm of pancreas, unspecified: Secondary | ICD-10-CM | POA: Diagnosis not present

## 2019-07-23 DIAGNOSIS — Z808 Family history of malignant neoplasm of other organs or systems: Secondary | ICD-10-CM | POA: Diagnosis not present

## 2019-07-23 DIAGNOSIS — J449 Chronic obstructive pulmonary disease, unspecified: Secondary | ICD-10-CM | POA: Diagnosis not present

## 2019-07-23 DIAGNOSIS — Z801 Family history of malignant neoplasm of trachea, bronchus and lung: Secondary | ICD-10-CM | POA: Diagnosis not present

## 2019-07-23 DIAGNOSIS — Z8041 Family history of malignant neoplasm of ovary: Secondary | ICD-10-CM | POA: Diagnosis not present

## 2019-07-23 DIAGNOSIS — C251 Malignant neoplasm of body of pancreas: Secondary | ICD-10-CM | POA: Diagnosis not present

## 2019-07-23 LAB — CBC WITH DIFFERENTIAL/PLATELET
Abs Immature Granulocytes: 0.04 10*3/uL (ref 0.00–0.07)
Basophils Absolute: 0 10*3/uL (ref 0.0–0.1)
Basophils Relative: 0 %
Eosinophils Absolute: 0.2 10*3/uL (ref 0.0–0.5)
Eosinophils Relative: 2 %
HCT: 30.9 % — ABNORMAL LOW (ref 39.0–52.0)
Hemoglobin: 10 g/dL — ABNORMAL LOW (ref 13.0–17.0)
Immature Granulocytes: 1 %
Lymphocytes Relative: 38 %
Lymphs Abs: 3.2 10*3/uL (ref 0.7–4.0)
MCH: 35.5 pg — ABNORMAL HIGH (ref 26.0–34.0)
MCHC: 32.4 g/dL (ref 30.0–36.0)
MCV: 109.6 fL — ABNORMAL HIGH (ref 80.0–100.0)
Monocytes Absolute: 1.1 10*3/uL — ABNORMAL HIGH (ref 0.1–1.0)
Monocytes Relative: 13 %
Neutro Abs: 4 10*3/uL (ref 1.7–7.7)
Neutrophils Relative %: 46 %
Platelets: 202 10*3/uL (ref 150–400)
RBC: 2.82 MIL/uL — ABNORMAL LOW (ref 4.22–5.81)
RDW: 16.6 % — ABNORMAL HIGH (ref 11.5–15.5)
WBC: 8.6 10*3/uL (ref 4.0–10.5)
nRBC: 0 % (ref 0.0–0.2)

## 2019-07-23 LAB — COMPREHENSIVE METABOLIC PANEL
ALT: 27 U/L (ref 0–44)
AST: 37 U/L (ref 15–41)
Albumin: 3.2 g/dL — ABNORMAL LOW (ref 3.5–5.0)
Alkaline Phosphatase: 100 U/L (ref 38–126)
Anion gap: 5 (ref 5–15)
BUN: 10 mg/dL (ref 8–23)
CO2: 27 mmol/L (ref 22–32)
Calcium: 8.7 mg/dL — ABNORMAL LOW (ref 8.9–10.3)
Chloride: 106 mmol/L (ref 98–111)
Creatinine, Ser: 0.7 mg/dL (ref 0.61–1.24)
GFR calc Af Amer: >60 mL/min (ref >60)
GFR calc non Af Amer: >60 mL/min (ref >60)
Glucose, Bld: 106 mg/dL — ABNORMAL HIGH (ref 70–99)
Potassium: 4.2 mmol/L (ref 3.5–5.1)
Sodium: 138 mmol/L (ref 135–145)
Total Bilirubin: 0.7 mg/dL (ref 0.3–1.2)
Total Protein: 6.4 g/dL — ABNORMAL LOW (ref 6.5–8.1)

## 2019-07-23 MED ORDER — DEXTROSE 5 % IV SOLN: Freq: Once | INTRAVENOUS | Status: AC

## 2019-07-23 MED ORDER — SODIUM CHLORIDE 0.9 % IV SOLN
400.0000 mg/m2 | Freq: Once | INTRAVENOUS | Status: AC
  Administered 2019-07-23: 772 mg via INTRAVENOUS
  Filled 2019-07-23: qty 38.6

## 2019-07-23 MED ORDER — SODIUM CHLORIDE 0.9 % IV SOLN
10.0000 mg | Freq: Once | INTRAVENOUS | Status: AC
  Administered 2019-07-23: 10 mg via INTRAVENOUS
  Filled 2019-07-23: qty 10

## 2019-07-23 MED ORDER — FUROSEMIDE 20 MG PO TABS: 20.0000 mg | ORAL_TABLET | Freq: Every day | ORAL | 1 refills | Status: AC | PRN

## 2019-07-23 MED ORDER — PALONOSETRON HCL INJECTION 0.25 MG/5ML
0.2500 mg | Freq: Once | INTRAVENOUS | Status: AC
  Administered 2019-07-23: 0.25 mg via INTRAVENOUS
  Filled 2019-07-23: qty 5

## 2019-07-23 MED ORDER — SODIUM CHLORIDE 0.9 % IV SOLN
150.0000 mg/m2 | Freq: Once | INTRAVENOUS | Status: AC
  Administered 2019-07-23: 280 mg via INTRAVENOUS
  Filled 2019-07-23: qty 4

## 2019-07-23 MED ORDER — OXALIPLATIN CHEMO INJECTION 100 MG/20ML
53.0000 mg/m2 | Freq: Once | INTRAVENOUS | Status: AC
  Administered 2019-07-23: 100 mg via INTRAVENOUS
  Filled 2019-07-23: qty 20

## 2019-07-23 MED ORDER — ATROPINE SULFATE 1 MG/ML IJ SOLN
0.5000 mg | Freq: Once | INTRAMUSCULAR | Status: AC | PRN
  Administered 2019-07-23: 0.5 mg via INTRAVENOUS
  Filled 2019-07-23: qty 1

## 2019-07-23 MED ORDER — SODIUM CHLORIDE 0.9% FLUSH
10.0000 mL | INTRAVENOUS | Status: DC | PRN
  Administered 2019-07-23: 10 mL

## 2019-07-23 MED ORDER — SODIUM CHLORIDE 0.9 % IV SOLN
150.0000 mg | Freq: Once | INTRAVENOUS | Status: AC
  Administered 2019-07-23: 150 mg via INTRAVENOUS
  Filled 2019-07-23: qty 150

## 2019-07-23 MED ORDER — SODIUM CHLORIDE 0.9 % IV SOLN
2400.0000 mg/m2 | INTRAVENOUS | Status: DC
  Administered 2019-07-23: 4650 mg via INTRAVENOUS
  Filled 2019-07-23: qty 93

## 2019-07-23 NOTE — Progress Notes (Signed)
Patient tolerated chemotherapy with no complaints voiced.  Side effects with management reviewed with understanding verbalized.  Port site clean and dry with no bruising or swelling noted at site.  Good blood return noted before and after administration of chemotherapy.  Chemotherapy pump connected with no alarms noted.   Patient left in satisfactory condition with VSS and no s/s of distress noted.  

## 2019-07-23 NOTE — Progress Notes (Signed)
Patient has been assessed, vital signs and labs have been reviewed by Dr. Delton Coombes. ANC, Creatinine, LFTs, and Platelets are within treatment parameters per Dr. Delton Coombes. Treatment today will be the same as last treatment. The patient is good to proceed with treatment at this time.

## 2019-07-23 NOTE — Patient Instructions (Signed)
[place] at [facility] Discharge Instructions  You were seen today by Dr. Delton Coombes. He went over your recent results. You received your treatment today. You will get another referral to Dr. Barry Dienes. Dr. Delton Coombes will see you back in 2 weeks for labs and follow up.   Thank you for choosing [place] at [facility] to provide your oncology and hematology care.  To afford each patient quality time with our provider, please arrive at least 15 minutes before your scheduled appointment time.   If you have a lab appointment with the [facility] please come in thru the Main Entrance and check in at the main information desk  You need to re-schedule your appointment should you arrive 10 or more minutes late.  We strive to give you quality time with our providers, and arriving late affects you and other patients whose appointments are after yours.  Also, if you no show three or more times for appointments you may be dismissed from the clinic at the providers discretion.     Again, thank you for choosing [facility].  Our hope is that these requests will decrease the amount of time that you wait before being seen by our physicians.       _____________________________________________________________  Should you have questions after your visit to [facility], please contact our office at [phone] between the hours of 8:00 a.m. and 4:30 p.m.  Voicemails left after 4:00 p.m. will not be returned until the following business day.  For prescription refill requests, have your pharmacy contact our office and allow 72 hours.    Cancer Center Support Programs:   > Cancer Support Group  2nd Tuesday of the month 1pm-2pm, Journey Room

## 2019-07-23 NOTE — Progress Notes (Signed)
[place], [place] [address]   CLINIC:  Medical Oncology/Hematology  PCP:  Carlena Hurl, PA-C [address] / [place] [address] [phone]   REASON FOR VISIT:  Follow-up for pancreatic cancer  PRIOR THERAPY: None  CURRENT THERAPY: FOLFIRINOX & Aloxi  BRIEF ONCOLOGIC HISTORY:  Oncology History  Pancreatic cancer ([place])  03/28/2019 Initial Diagnosis   Pancreatic cancer ([place])   03/28/2019 Cancer Staging   Staging form: Exocrine Pancreas, AJCC 8th Edition - Clinical stage from 03/28/2019: Stage III (cT4, cN1, cM0) - Signed by Derek Jack, MD on 03/28/2019   04/20/2019 Genetic Testing   Negative genetic testing:  No pathogenic variants detected on the [facility] Multi-Cancer panel. The report date is 04/20/2019.  The Multi-Cancer Panel offered by [facility] includes sequencing and/or deletion duplication testing of the following 85 genes: AIP, ALK, APC, ATM, AXIN2,BAP1,  BARD1, BLM, BMPR1A, BRCA1, BRCA2, BRIP1, CASR, CDC73, CDH1, CDK4, CDKN1B, CDKN1C, CDKN2A (p14ARF), CDKN2A (p16INK4a), CEBPA, CHEK2, CTNNA1, DICER1, DIS3L2, EGFR (c.2369C>T, p.Thr790Met variant only), EPCAM (Deletion/duplication testing only), FH, FLCN, GATA2, GPC3, GREM1 (Promoter region deletion/duplication testing only), HOXB13 (c.251G>A, p.Gly84Glu), HRAS, KIT, MAX, MEN1, MET, MITF (c.952G>A, p.Glu318Lys variant only), MLH1, MSH2, MSH3, MSH6, MUTYH, NBN, NF1, NF2, NTHL1, PALB2, PDGFRA, PHOX2B, PMS2, POLD1, POLE, POT1, PRKAR1A, PTCH1, PTEN, RAD50, RAD51C, RAD51D, RB1, RECQL4, RET, RNF43, RUNX1, SDHAF2, SDHA (sequence changes only), SDHB, SDHC, SDHD, SMAD4, SMARCA4, SMARCB1, SMARCE1, STK11, SUFU, TERC, TERT, TMEM127, TP53, TSC1, TSC2, VHL, WRN and WT1.   04/23/2019 -  Chemotherapy   The patient had palonosetron (ALOXI) injection 0.25 mg, 0.25 mg, Intravenous,  Once, 6 of 8 cycles Administration: 0.25 mg (04/23/2019), 0.25 mg (05/07/2019), 0.25 mg (05/21/2019), 0.25 mg  (06/04/2019), 0.25 mg (06/18/2019), 0.25 mg (07/02/2019) pegfilgrastim-cbqv (UDENYCA) injection 6 mg, 6 mg, Subcutaneous, Once, 6 of 8 cycles Administration: 6 mg (04/25/2019), 6 mg (05/09/2019), 6 mg (05/23/2019), 6 mg (06/06/2019), 6 mg (06/20/2019), 6 mg (07/04/2019) irinotecan (CAMPTOSAR) 240 mg in sodium chloride 0.9 % 500 mL chemo infusion, 120 mg/m2 = 240 mg (80 % of original dose 150 mg/m2), Intravenous,  Once, 6 of 8 cycles Dose modification: 120 mg/m2 (80 % of original dose 150 mg/m2, Cycle 1, Reason: Other (see comments), Comment: cirrhosis) Administration: 240 mg (04/23/2019), 280 mg (05/07/2019), 280 mg (05/21/2019), 280 mg (06/04/2019), 280 mg (06/18/2019), 280 mg (07/02/2019) oxaliplatin (ELOXATIN) 130 mg in dextrose 5 % 500 mL chemo infusion, 68 mg/m2 = 130 mg (80 % of original dose 85 mg/m2), Intravenous,  Once, 6 of 8 cycles Dose modification: 68 mg/m2 (80 % of original dose 85 mg/m2, Cycle 1, Reason: Other (see comments), Comment: cirrhosis), 68 mg/m2 (80 % of original dose 85 mg/m2, Cycle 2, Reason: Provider Judgment), 68 mg/m2 (80 % of original dose 85 mg/m2, Cycle 3, Reason: Other (see comments), Comment: neuropathy), 55 mg/m2 (original dose 85 mg/m2, Cycle 5, Reason: Other (see comments), Comment: worsening neuropathy) Administration: 130 mg (04/23/2019), 130 mg (05/07/2019), 130 mg (05/21/2019), 130 mg (06/04/2019), 100 mg (06/18/2019), 100 mg (07/02/2019) fosaprepitant (EMEND) 150 mg in sodium chloride 0.9 % 145 mL IVPB, 150 mg, Intravenous,  Once, 6 of 8 cycles Administration: 150 mg (04/23/2019), 150 mg (05/07/2019), 150 mg (05/21/2019), 150 mg (06/04/2019), 150 mg (06/18/2019), 150 mg (07/02/2019) fluorouracil (ADRUCIL) 3,700 mg in sodium chloride 0.9 % 76 mL chemo infusion, 1,920 mg/m2 = 3,700 mg (80 % of original dose 2,400 mg/m2), Intravenous, 1 Day/Dose, 6 of 8 cycles Dose modification: 1,920 mg/m2 (80 % of original  dose 2,400 mg/m2, Cycle 1, Reason: Other (see comments), Comment: cirrhosis) Administration:  3,700 mg (04/23/2019), 4,650 mg (05/07/2019), 4,650 mg (05/21/2019), 4,650 mg (06/04/2019), 4,650 mg (06/18/2019), 4,650 mg (07/02/2019) leucovorin 618 mg in sodium chloride 0.9 % 250 mL infusion, 320 mg/m2 = 618 mg (80 % of original dose 400 mg/m2), Intravenous,  Once, 6 of 8 cycles Dose modification: 320 mg/m2 (80 % of original dose 400 mg/m2, Cycle 1, Reason: Other (see comments), Comment: cirrhosis) Administration: 618 mg (04/23/2019), 772 mg (05/07/2019), 772 mg (05/21/2019), 772 mg (06/04/2019), 772 mg (06/18/2019), 772 mg (07/02/2019)  for chemotherapy treatment.      CANCER STAGING: Cancer Staging Pancreatic cancer [facility]) Staging form: Exocrine Pancreas, AJCC 8th Edition - Clinical stage from 03/28/2019: Stage III (cT4, cN1, cM0) - Signed by Derek Jack, MD on 03/28/2019   INTERVAL HISTORY:  Mr. Early Steel, a 64 y.o. male, returns for routine follow-up and consideration for next cycle of chemotherapy. Jahmal was last seen on 07/16/2019.  Due for cycle #7 of FOLFIRINOX today.   Today he is accompanied by his wife. He reports that Dr. Barry Dienes has not reached out to him yet. His feet started swelling a couple days ago. His pain is well controlled with 1 tablet of oxycodone every 4-6 hours. His appetite is improving and he is eating more.  Overall, he feels ready for next cycle of chemo today.    REVIEW OF SYSTEMS:  Review of Systems  Constitutional: Negative for appetite change and fatigue.  Cardiovascular: Positive for leg swelling (feet swelling).  All other systems reviewed and are negative.   PAST MEDICAL/SURGICAL HISTORY:  Past Medical History:  Diagnosis Date  . Aortic atherosclerosis ([place])   . COPD (chronic obstructive pulmonary disease) ([place])    per CT findings 2019  . Family history of brain cancer   . Family history of colon cancer   . Family history of kidney cancer   . Family history of leukemia   . Family history of lung cancer   . Family history of ovarian  cancer   . Family history of prostate cancer   . Family history of stomach cancer   . GERD (gastroesophageal reflux disease)   . Hepatitis C    referred to hepatitis clinic 2019 but he never went  . Hyperlipidemia   . Hypertension   . Port-A-Cath in place 04/17/2019  . Smoker   . Wears glasses    Past Surgical History:  Procedure Laterality Date  . ANTERIOR CERVICAL DECOMP/DISCECTOMY FUSION  12/2017   Dr. Consuella Lose  . BIOPSY  03/11/2019   Procedure: BIOPSY;  Surgeon: [name]., MD;  Location: [place];  Service: Gastroenterology;;  . COLONOSCOPY N/A 01/19/2017   Procedure: COLONOSCOPY;  Surgeon: Rogene Houston, MD;  [facility];  Service: Endoscopy;  Laterality: N/A;  930-moved to 9:45 per Lelon Frohlich  . ESOPHAGOGASTRODUODENOSCOPY (EGD) WITH PROPOFOL N/A 03/11/2019   Procedure: ESOPHAGOGASTRODUODENOSCOPY (EGD) WITH PROPOFOL;  Surgeon: Rush Landmark Telford Nab., MD;  Location: [facility];  Service: Gastroenterology;  Laterality: N/A;  . EUS N/A 03/11/2019   Procedure: UPPER ENDOSCOPIC ULTRASOUND (EUS) RADIAL;  Surgeon: Rush Landmark Telford Nab., MD;  Location: [place];  Service: Gastroenterology;  Laterality: N/A;  . FINE NEEDLE ASPIRATION  03/11/2019   Procedure: FINE NEEDLE ASPIRATION (FNA) LINEAR;  Surgeon: [name]., MD;  Location: [place];  Service: Gastroenterology;;  . POLYPECTOMY  01/19/2017   Procedure: POLYPECTOMY;  Surgeon: Rogene Houston, MD;  [facility];  Service: Endoscopy;;  splenic flexure  . POLYPECTOMY  03/11/2019   Procedure: POLYPECTOMY;  Surgeon: Mansouraty, Telford Nab., MD;  Location: [place];  Service: Gastroenterology;;  . Sol Passer PLACEMENT Left 04/15/2019   Procedure: INSERTION PORT-A-CATH;  Surgeon: Aviva Signs, MD;  Location[facility];  Service: General;  Laterality: Left;  . Right eye surgery     as a child-removed muscle from leg and put in upper eye lid    SOCIAL HISTORY:  Social History    Socioeconomic History  . Marital status: Widowed    Spouse name: Not on file  . Number of children: Not on file  . Years of education: Not on file  . Highest education level: Not on file  Occupational History    Employer: [employer]  Tobacco Use  . Smoking status: Current Every Day Smoker    Packs/day: 0.50    Years: 45.00    Pack years: 22.50    Types: Cigarettes  . Smokeless tobacco: Never Used  Vaping Use  . Vaping Use: Never used  Substance and Sexual Activity  . Alcohol use: Not Currently  . Drug use: No  . Sexual activity: Not Currently  Other Topics Concern  . Not on file  Social History Narrative  . Not on file   Social Determinants of Health   Financial Resource Strain: Low Risk   . Difficulty of Paying Living Expenses: Not hard at all  Food Insecurity: No Food Insecurity  . Worried About Charity fundraiser in the Last Year: Never true  . Ran Out of Food in the Last Year: Never true  Transportation Needs: No Transportation Needs  . Lack of Transportation (Medical): No  . Lack of Transportation (Non-Medical): No  Physical Activity: Inactive  . Days of Exercise per Week: 0 days  . Minutes of Exercise per Session: 0 min  Stress: Stress Concern Present  . Feeling of Stress : Rather much  Social Connections: Socially Isolated  . Frequency of Communication with Friends and Family: Three times a week  . Frequency of Social Gatherings with Friends and Family: Three times a week  . Attends Religious Services: Never  . Active Member of Clubs or Organizations: No  . Attends Archivist Meetings: Never  . Marital Status: Widowed  Intimate Partner Violence: Not At Risk  . Fear of Current or Ex-Partner: No  . Emotionally Abused: No  . Physically Abused: No  . Sexually Abused: No    FAMILY HISTORY:  Family History  Problem Relation Age of Onset  . Throat cancer Father 88       died in his 48s of stomach issues?  . Lung cancer Brother 32        smoker, non-small cell  . Ovarian cancer Sister 98       GI related death  . Heart attack Maternal Grandmother   . Brain cancer Maternal Aunt        dx. in her 59s  . Lung cancer Maternal Uncle        dx. in his 42s-60s, smoker  . Stomach cancer Maternal Aunt        dx. in her 19s  . Colon cancer Cousin        dx. >50 - maternal cousin  . Kidney cancer Cousin        dx. in his 17s - maternal cousin  . Prostate cancer Cousin        dx. in his 81s - maternal  cousin  . Leukemia Nephew 34  . Heart disease Neg Hx   . Hypertension Neg Hx   . Stroke Neg Hx     CURRENT MEDICATIONS:  Current Outpatient Medications  Medication Sig Dispense Refill  . docusate sodium (COLACE) 100 MG capsule Take 100 mg by mouth daily.    . DULoxetine (CYMBALTA) 30 MG capsule TAKE 1 CAPSULE(30 MG) BY MOUTH DAILY 30 capsule 0  . fluorouracil CALGB 02542 in sodium chloride 0.9 % 150 mL Inject 2,400 mg/m2 into the vein over 48 hr.    . FLUOROURACIL IV Inject into the vein every 21 ( twenty-one) days.    . IRINOTECAN HCL IV Inject into the vein every 14 (fourteen) days.    [name] LEUCOVORIN CALCIUM IV Inject into the vein every 14 (fourteen) days.    [name] lisinopril (ZESTRIL) 20 MG tablet TAKE 1 TABLET BY MOUTH DAILY 30 tablet 0  . lovastatin (MEVACOR) 20 MG tablet TAKE 1 TABLET BY MOUTH AT BEDTIME 30 tablet 0  . melatonin 5 MG TABS Take 5 mg by mouth at bedtime.    . Multiple Vitamin (MULTIVITAMIN) tablet Take 1 tablet by mouth daily.    . OXALIPLATIN IV Inject into the vein every 14 (fourteen) days.    . Oxycodone HCl 10 MG TABS Take 1 tablet (10 mg total) by mouth every 4 (four) hours as needed. 84 tablet 0  . furosemide (LASIX) 20 MG tablet Take 1 tablet (20 mg total) by mouth daily as needed. 30 tablet 1  . lidocaine-prilocaine (EMLA) cream Apply a small amount to port a cath site and cover with plastic wrap one hour prior to chemotherapy appointments (Patient not taking: Reported on 07/23/2019) 30 g 3  .  loperamide (IMODIUM A-D) 2 MG tablet Take 2 at onset of diarrhea, then 1 tab after every watery bowel movement - DO NOT EXCEED 8 tablets in a 24 hour period (Patient not taking: Reported on 07/23/2019) 100 tablet 1  . prochlorperazine (COMPAZINE) 10 MG tablet Take 1 tablet (10 mg total) by mouth every 6 (six) hours as needed (Nausea or vomiting). (Patient not taking: Reported on 07/23/2019) 60 tablet 3   No current facility-administered medications for this visit.    ALLERGIES:  No Known Allergies  PHYSICAL EXAM:  Performance status (ECOG): 1 - Symptomatic but completely ambulatory  Vitals:   07/23/19 0751  BP: 121/64  Pulse: 61  Resp: 18  Temp: (!) 97.1 F (36.2 C)  SpO2: 96%   Wt Readings from Last 3 Encounters:  07/23/19 165 lb 2 oz (74.9 kg)  07/16/19 157 lb 11.2 oz (71.5 kg)  07/02/19 166 lb 3.2 oz (75.4 kg)   Physical Exam Vitals reviewed.  Constitutional:      Appearance: Normal appearance.  Cardiovascular:     Rate and Rhythm: Normal rate and regular rhythm.     Pulses: Normal pulses.     Heart sounds: Normal heart sounds.  Pulmonary:     Effort: Pulmonary effort is normal.     Breath sounds: Normal breath sounds.  Musculoskeletal:     Right lower leg: Edema (1+) present.     Left lower leg: Edema (1+) present.  Neurological:     General: No focal deficit present.     Mental Status: He is alert and oriented to person, place, and time.  Psychiatric:        Mood and Affect: Mood normal.        Behavior: Behavior normal.  LABORATORY DATA:  I have reviewed the labs as listed.  CBC Latest Ref Rng & Units 07/23/2019 07/16/2019 07/02/2019  WBC 4.0 - 10.5 K/uL 8.6 17.3(H) 14.2(H)  Hemoglobin 13.0 - 17.0 g/dL 10.0(L) 10.9(L) 10.2(L)  Hematocrit 39 - 52 % 30.9(L) 33.3(L) 31.2(L)  Platelets 150 - 400 K/uL 202 177 143(L)   CMP Latest Ref Rng & Units 07/23/2019 07/16/2019 07/02/2019  Glucose 70 - 99 mg/dL 106(H) 99 111(H)  BUN 8 - 23 mg/dL 10 10 15   Creatinine 0.61 -  1.24 mg/dL 0.70 0.66 0.75  Sodium 135 - 145 mmol/L 138 138 138  Potassium 3.5 - 5.1 mmol/L 4.2 3.6 4.0  Chloride 98 - 111 mmol/L 106 109 107  CO2 22 - 32 mmol/L 27 24 24   Calcium 8.9 - 10.3 mg/dL 8.7(L) 8.6(L) 8.6(L)  Total Protein 6.5 - 8.1 g/dL 6.4(L) 6.7 6.3(L)  Total Bilirubin 0.3 - 1.2 mg/dL 0.7 0.6 0.5  Alkaline Phos 38 - 126 U/L 100 119 115  AST 15 - 41 U/L 37 33 35  ALT 0 - 44 U/L 27 37 38   Lab Results  Component Value Date   CAN199 200 (H) 07/16/2019   WUJ811 40 (H) 02/13/2019    DIAGNOSTIC IMAGING:  I have independently reviewed the scans and discussed with the patient. CT Abdomen Pelvis W Wo Contrast  Result Date: 07/12/2019 CLINICAL DATA:  Pancreatic adenocarcinoma, current chemotherapy. EXAM: CT CHEST WITH CONTRAST CT ABDOMEN WITHOUT AND WITH CONTRAST TECHNIQUE: Multidetector CT imaging of the abdomen was performed without intravenous contrast. Multidetector CT imaging of the chest and abdomen was then performed during bolus administration of intravenous contrast. CONTRAST:  180m OMNIPAQUE IOHEXOL 300 MG/ML  SOLN COMPARISON:  Multiple exams, including 03/28/2019 and 03/12/2019 FINDINGS: CT CHEST WITH CONTRAST Cardiovascular: Left Port-A-Cath tip: SVC. Coronary, aortic arch, and branch vessel atherosclerotic vascular disease. Mediastinum/Nodes: Stable left paratracheal lymph nodes are within normal size limits, stable from before, not previously hypermetabolic. A 0.5 cm left internal mammary lymph node is likewise stable and was not previously hypermetabolic. Lungs/Pleura: Mild bilateral airway thickening. Otherwise unremarkable. Musculoskeletal: Thoracic spondylosis. CT ABDOMEN WITHOUT AND WITH CONTRAST Hepatobiliary: Hepatic morphology suggests early cirrhosis. Borderline wall thickening in the gallbladder. No abnormal arterial phase enhancement in the liver. The common hepatic duct measures up to 1.0 cm in diameter. The proximal common bile duct is indistinct, distal CBD  measures 0.6 cm in diameter. Pancreas: Dorsal pancreatic duct dilatation in the pancreatic tail extending to a 2.1 by 2.2 by 3.6 cm hypoenhancing mass in the pancreatic body. Although hypoenhancing on the arterial phase images, the best contrast between the mass and the surrounding pancreatic parenchyma is on the portal venous phase images. This mass appears to have enlarged, by my measurements the main mass was previously 2.1 by 1.6 by 2.0 cm on 03/12/2019. The mass narrows and directly abuts the portal vein at its junction with the splenic vein and SMV, and also narrows the SMV. There is adjacent scattered right gastric and peripancreatic adenopathy with 1 peripancreatic lymph node measuring 1.1 cm in short axis on image 168/11, previously 1.3 cm by my measurements. The scattered small right gastric lymph nodes appear roughly stable. The upper margin of the mass abuts the splenic artery and some of the local lymph nodes are near the common hepatic artery. No variant hepatic artery appearance. Spleen: The spleen measures 9.8 by 5.1 by 11.4 cm (volume = 300 cm^3), within normal limits. No focal splenic lesion. Adrenals/Urinary Tract: Stable fullness of  the lateral limb left adrenal gland without a discrete mass. Right adrenal gland normal. No urinary tract calculi are identified. The urinary bladder appears unremarkable. Stomach/Bowel: The pancreatic mass abuts the posterior margin of the stomach as shown on image 179/11, without definite intact fat plane but also without definite indicators of invasion. Air fluid levels in the distal colon suggesting diarrheal process. Vascular/Lymphatic: Aortoiliac atherosclerotic vascular disease. Mild peripancreatic adenopathy as noted above. Narrowing of the portal vein at the confluence of the splenic and SMV as noted above. Other: The omentum or trace pelvic ascites. No definite nodularity along mesenteric. Small central mesenteric lymph nodes. Musculoskeletal: Lumbar  spondylosis and degenerative disc disease causing mild multilevel impingement. IMPRESSION: 1. Enlarging hypoenhancing mass in the pancreatic body, currently measuring 2.1 by 2.2 by 3.6 cm, with dorsal pancreatic duct dilatation in the pancreatic tail. The mass narrows and directly abuts the portal vein at its junction with the splenic vein and SMV, and also narrows the SMV. 2. Mild peripancreatic adenopathy. 3. Other imaging findings of potential clinical significance: Coronary atherosclerosis. Mild bilateral airway thickening is present, suggesting bronchitis or reactive airways disease. Hepatic morphology suggests early cirrhosis. Lumbar spondylosis and degenerative disc disease causing mild multilevel impingement. Borderline wall thickening in the gallbladder. Air fluid levels in the distal colon suggesting diarrheal process. 4. Aortic atherosclerosis. Aortic Atherosclerosis (ICD10-I70.0). Electronically Signed   By: Van Clines M.D.   On: 07/12/2019 12:19   CT Chest W Contrast  Result Date: 07/12/2019 CLINICAL DATA:  Pancreatic adenocarcinoma, current chemotherapy. EXAM: CT CHEST WITH CONTRAST CT ABDOMEN WITHOUT AND WITH CONTRAST TECHNIQUE: Multidetector CT imaging of the abdomen was performed without intravenous contrast. Multidetector CT imaging of the chest and abdomen was then performed during bolus administration of intravenous contrast. CONTRAST:  162m OMNIPAQUE IOHEXOL 300 MG/ML  SOLN COMPARISON:  Multiple exams, including 03/28/2019 and 03/12/2019 FINDINGS: CT CHEST WITH CONTRAST Cardiovascular: Left Port-A-Cath tip: SVC. Coronary, aortic arch, and branch vessel atherosclerotic vascular disease. Mediastinum/Nodes: Stable left paratracheal lymph nodes are within normal size limits, stable from before, not previously hypermetabolic. A 0.5 cm left internal mammary lymph node is likewise stable and was not previously hypermetabolic. Lungs/Pleura: Mild bilateral airway thickening. Otherwise  unremarkable. Musculoskeletal: Thoracic spondylosis. CT ABDOMEN WITHOUT AND WITH CONTRAST Hepatobiliary: Hepatic morphology suggests early cirrhosis. Borderline wall thickening in the gallbladder. No abnormal arterial phase enhancement in the liver. The common hepatic duct measures up to 1.0 cm in diameter. The proximal common bile duct is indistinct, distal CBD measures 0.6 cm in diameter. Pancreas: Dorsal pancreatic duct dilatation in the pancreatic tail extending to a 2.1 by 2.2 by 3.6 cm hypoenhancing mass in the pancreatic body. Although hypoenhancing on the arterial phase images, the best contrast between the mass and the surrounding pancreatic parenchyma is on the portal venous phase images. This mass appears to have enlarged, by my measurements the main mass was previously 2.1 by 1.6 by 2.0 cm on 03/12/2019. The mass narrows and directly abuts the portal vein at its junction with the splenic vein and SMV, and also narrows the SMV. There is adjacent scattered right gastric and peripancreatic adenopathy with 1 peripancreatic lymph node measuring 1.1 cm in short axis on image 168/11, previously 1.3 cm by my measurements. The scattered small right gastric lymph nodes appear roughly stable. The upper margin of the mass abuts the splenic artery and some of the local lymph nodes are near the common hepatic artery. No variant hepatic artery appearance. Spleen: The spleen measures 9.8 by  5.1 by 11.4 cm (volume = 300 cm^3), within normal limits. No focal splenic lesion. Adrenals/Urinary Tract: Stable fullness of the lateral limb left adrenal gland without a discrete mass. Right adrenal gland normal. No urinary tract calculi are identified. The urinary bladder appears unremarkable. Stomach/Bowel: The pancreatic mass abuts the posterior margin of the stomach as shown on image 179/11, without definite intact fat plane but also without definite indicators of invasion. Air fluid levels in the distal colon suggesting  diarrheal process. Vascular/Lymphatic: Aortoiliac atherosclerotic vascular disease. Mild peripancreatic adenopathy as noted above. Narrowing of the portal vein at the confluence of the splenic and SMV as noted above. Other: The omentum or trace pelvic ascites. No definite nodularity along mesenteric. Small central mesenteric lymph nodes. Musculoskeletal: Lumbar spondylosis and degenerative disc disease causing mild multilevel impingement. IMPRESSION: 1. Enlarging hypoenhancing mass in the pancreatic body, currently measuring 2.1 by 2.2 by 3.6 cm, with dorsal pancreatic duct dilatation in the pancreatic tail. The mass narrows and directly abuts the portal vein at its junction with the splenic vein and SMV, and also narrows the SMV. 2. Mild peripancreatic adenopathy. 3. Other imaging findings of potential clinical significance: Coronary atherosclerosis. Mild bilateral airway thickening is present, suggesting bronchitis or reactive airways disease. Hepatic morphology suggests early cirrhosis. Lumbar spondylosis and degenerative disc disease causing mild multilevel impingement. Borderline wall thickening in the gallbladder. Air fluid levels in the distal colon suggesting diarrheal process. 4. Aortic atherosclerosis. Aortic Atherosclerosis (ICD10-I70.0). Electronically Signed   By: Van Clines M.D.   On: 07/12/2019 12:19     ASSESSMENT:  1. Pancreatic adenocarcinoma (UT 4 N1 MX): -PET scan on 03/28/2019 did not show any evidence of metastatic disease. -CA 19-9 was 40 on 02/23/2019. -Germline mutation testing was negative. -6 cycles of FOLFIRINOX from 04/23/2019 through 07/02/2019. -CT CAP on 07/11/2019 showed enlarging mass in the pancreatic body measuring 2.1 x 2.2 x 3.6 cm.  The mass narrows and directly abuts the portal vein at its junction with the splenic vein and SMV and also narrows the SMV.  Mild peripancreatic adenopathy more or less stable.  No new lesions.  2. Epigastric pain: -This is fairly  improved since the start of treatments. -He is using hydrocodone 5/325 as needed. Some days he does not require any pain medication.  3. Liver disease: -CT scan showed mild degree of cirrhosis. History of hepatitis C which was untreated.   PLAN:  1. Pancreatic adenocarcinoma: -He completed 6 cycles of chemotherapy with FOLFIRINOX.  CT scan showed mixed response. -I have reached out to Dr. Barry Dienes.  She indicated that surgery is an option and she will schedule it in the next 5 to 6 weeks.  She wanted [place] to proceed with 1 more cycle of chemotherapy in the interim. -I will not change his chemotherapy regimen at this time.  He will continue with his next cycle of FOLFIRINOX. -I plan to see him back in 2 weeks with repeat labs.  2. Tingling/numbness: -He stopped taking gabapentin as he could not tolerate it. -He has some numbness in the fingertips and toes from spine problem which has gotten slightly worsened since chemotherapy.  3. Epigastric pain: -Pain is better controlled since we switched him to oxycodone 10 mg every 4-6 hours as needed.   Orders placed this encounter:  No orders of the defined types were placed in this encounter.    Derek Jack, MD [place] [phone]   I, Milinda Antis, am acting as a scribe for Dr.  Sanda Linger.  I, Derek Jack MD, have reviewed the above documentation for accuracy and completeness, and I agree with the above.

## 2019-07-24 ENCOUNTER — Other Ambulatory Visit: Payer: Self-pay | Admitting: Medical

## 2019-07-25 ENCOUNTER — Other Ambulatory Visit (HOSPITAL_COMMUNITY): Payer: Self-pay | Admitting: *Deleted

## 2019-07-25 ENCOUNTER — Encounter (HOSPITAL_COMMUNITY): Payer: Self-pay | Admitting: *Deleted

## 2019-07-25 ENCOUNTER — Encounter (HOSPITAL_COMMUNITY): Payer: Self-pay

## 2019-07-25 ENCOUNTER — Encounter (HOSPITAL_COMMUNITY): Payer: BC Managed Care – PPO

## 2019-07-25 ENCOUNTER — Inpatient Hospital Stay (HOSPITAL_COMMUNITY): Payer: BC Managed Care – PPO

## 2019-07-25 VITALS — BP 114/69 | HR 65 | Temp 97.5°F | Resp 18

## 2019-07-25 DIAGNOSIS — Z5111 Encounter for antineoplastic chemotherapy: Secondary | ICD-10-CM | POA: Diagnosis not present

## 2019-07-25 DIAGNOSIS — Z808 Family history of malignant neoplasm of other organs or systems: Secondary | ICD-10-CM | POA: Diagnosis not present

## 2019-07-25 DIAGNOSIS — R200 Anesthesia of skin: Secondary | ICD-10-CM | POA: Diagnosis not present

## 2019-07-25 DIAGNOSIS — J449 Chronic obstructive pulmonary disease, unspecified: Secondary | ICD-10-CM | POA: Diagnosis not present

## 2019-07-25 DIAGNOSIS — F17210 Nicotine dependence, cigarettes, uncomplicated: Secondary | ICD-10-CM | POA: Diagnosis not present

## 2019-07-25 DIAGNOSIS — B1920 Unspecified viral hepatitis C without hepatic coma: Secondary | ICD-10-CM | POA: Diagnosis not present

## 2019-07-25 DIAGNOSIS — R202 Paresthesia of skin: Secondary | ICD-10-CM | POA: Diagnosis not present

## 2019-07-25 DIAGNOSIS — Z800 Family history of malignant neoplasm of digestive organs: Secondary | ICD-10-CM | POA: Diagnosis not present

## 2019-07-25 DIAGNOSIS — Z801 Family history of malignant neoplasm of trachea, bronchus and lung: Secondary | ICD-10-CM | POA: Diagnosis not present

## 2019-07-25 DIAGNOSIS — K769 Liver disease, unspecified: Secondary | ICD-10-CM | POA: Diagnosis not present

## 2019-07-25 DIAGNOSIS — Z5189 Encounter for other specified aftercare: Secondary | ICD-10-CM | POA: Diagnosis not present

## 2019-07-25 DIAGNOSIS — R1013 Epigastric pain: Secondary | ICD-10-CM | POA: Diagnosis not present

## 2019-07-25 DIAGNOSIS — Z8041 Family history of malignant neoplasm of ovary: Secondary | ICD-10-CM | POA: Diagnosis not present

## 2019-07-25 DIAGNOSIS — C250 Malignant neoplasm of head of pancreas: Secondary | ICD-10-CM

## 2019-07-25 DIAGNOSIS — Z806 Family history of leukemia: Secondary | ICD-10-CM | POA: Diagnosis not present

## 2019-07-25 DIAGNOSIS — K7460 Unspecified cirrhosis of liver: Secondary | ICD-10-CM | POA: Diagnosis not present

## 2019-07-25 DIAGNOSIS — Z95828 Presence of other vascular implants and grafts: Secondary | ICD-10-CM

## 2019-07-25 DIAGNOSIS — C251 Malignant neoplasm of body of pancreas: Secondary | ICD-10-CM | POA: Diagnosis not present

## 2019-07-25 MED ORDER — HEPARIN SOD (PORK) LOCK FLUSH 100 UNIT/ML IV SOLN
500.0000 [IU] | Freq: Once | INTRAVENOUS | Status: AC | PRN
  Administered 2019-07-25: 500 [IU]

## 2019-07-25 MED ORDER — SODIUM CHLORIDE 0.9% FLUSH
10.0000 mL | INTRAVENOUS | Status: DC | PRN
  Administered 2019-07-25: 10 mL

## 2019-07-25 MED ORDER — PEGFILGRASTIM-CBQV 6 MG/0.6ML ~~LOC~~ SOSY
6.0000 mg | PREFILLED_SYRINGE | Freq: Once | SUBCUTANEOUS | Status: AC
  Administered 2019-07-25: 6 mg via SUBCUTANEOUS
  Filled 2019-07-25: qty 0.6

## 2019-07-25 NOTE — Progress Notes (Signed)
Chemo pump disconnected with no complaints voiced. No bruising or swelling noted.  Band aid applied.   Patient tolerated injection with no complaints voiced.  Site clean and dry with no bruising or swelling noted at site.  Band aid applied.  Vss with discharge and left in satisfactory condition with no s/s of distress noted.

## 2019-07-26 ENCOUNTER — Other Ambulatory Visit: Payer: Self-pay | Admitting: Medical

## 2019-07-26 ENCOUNTER — Encounter (HOSPITAL_COMMUNITY): Payer: Self-pay | Admitting: *Deleted

## 2019-07-29 ENCOUNTER — Encounter (HOSPITAL_COMMUNITY): Payer: Self-pay | Admitting: *Deleted

## 2019-07-29 ENCOUNTER — Other Ambulatory Visit (HOSPITAL_COMMUNITY): Payer: Self-pay | Admitting: *Deleted

## 2019-07-29 MED ORDER — OXYCODONE HCL 10 MG PO TABS: 10.0000 mg | ORAL_TABLET | ORAL | 0 refills | Status: DC | PRN

## 2019-07-30 ENCOUNTER — Ambulatory Visit (HOSPITAL_COMMUNITY): Payer: BC Managed Care – PPO | Admitting: Hematology

## 2019-07-30 ENCOUNTER — Ambulatory Visit (HOSPITAL_COMMUNITY): Payer: BC Managed Care – PPO

## 2019-07-30 ENCOUNTER — Other Ambulatory Visit (HOSPITAL_COMMUNITY): Payer: BC Managed Care – PPO

## 2019-08-01 ENCOUNTER — Encounter (HOSPITAL_COMMUNITY): Payer: BC Managed Care – PPO

## 2019-08-06 ENCOUNTER — Other Ambulatory Visit: Payer: Self-pay

## 2019-08-06 ENCOUNTER — Inpatient Hospital Stay (HOSPITAL_COMMUNITY): Payer: BC Managed Care – PPO

## 2019-08-06 ENCOUNTER — Inpatient Hospital Stay (HOSPITAL_COMMUNITY): Payer: BC Managed Care – PPO | Attending: Hematology | Admitting: Hematology

## 2019-08-06 VITALS — BP 86/66 | HR 67 | Temp 97.8°F | Resp 18 | Wt 154.7 lb

## 2019-08-06 DIAGNOSIS — Z8051 Family history of malignant neoplasm of kidney: Secondary | ICD-10-CM | POA: Diagnosis not present

## 2019-08-06 DIAGNOSIS — Z808 Family history of malignant neoplasm of other organs or systems: Secondary | ICD-10-CM | POA: Insufficient documentation

## 2019-08-06 DIAGNOSIS — K7460 Unspecified cirrhosis of liver: Secondary | ICD-10-CM | POA: Diagnosis not present

## 2019-08-06 DIAGNOSIS — Z8042 Family history of malignant neoplasm of prostate: Secondary | ICD-10-CM | POA: Diagnosis not present

## 2019-08-06 DIAGNOSIS — Z800 Family history of malignant neoplasm of digestive organs: Secondary | ICD-10-CM | POA: Insufficient documentation

## 2019-08-06 DIAGNOSIS — R1013 Epigastric pain: Secondary | ICD-10-CM | POA: Diagnosis not present

## 2019-08-06 DIAGNOSIS — K769 Liver disease, unspecified: Secondary | ICD-10-CM | POA: Insufficient documentation

## 2019-08-06 DIAGNOSIS — C251 Malignant neoplasm of body of pancreas: Secondary | ICD-10-CM | POA: Diagnosis not present

## 2019-08-06 DIAGNOSIS — Z8041 Family history of malignant neoplasm of ovary: Secondary | ICD-10-CM | POA: Insufficient documentation

## 2019-08-06 DIAGNOSIS — Z79899 Other long term (current) drug therapy: Secondary | ICD-10-CM | POA: Insufficient documentation

## 2019-08-06 DIAGNOSIS — C250 Malignant neoplasm of head of pancreas: Secondary | ICD-10-CM

## 2019-08-06 DIAGNOSIS — F17210 Nicotine dependence, cigarettes, uncomplicated: Secondary | ICD-10-CM | POA: Diagnosis not present

## 2019-08-06 DIAGNOSIS — R202 Paresthesia of skin: Secondary | ICD-10-CM | POA: Diagnosis not present

## 2019-08-06 DIAGNOSIS — Z806 Family history of leukemia: Secondary | ICD-10-CM | POA: Diagnosis not present

## 2019-08-06 LAB — COMPREHENSIVE METABOLIC PANEL
ALT: 31 U/L (ref 0–44)
AST: 42 U/L — ABNORMAL HIGH (ref 15–41)
Albumin: 3.4 g/dL — ABNORMAL LOW (ref 3.5–5.0)
Alkaline Phosphatase: 99 U/L (ref 38–126)
Anion gap: 8 (ref 5–15)
BUN: 17 mg/dL (ref 8–23)
CO2: 22 mmol/L (ref 22–32)
Calcium: 8.4 mg/dL — ABNORMAL LOW (ref 8.9–10.3)
Chloride: 101 mmol/L (ref 98–111)
Creatinine, Ser: 0.67 mg/dL (ref 0.61–1.24)
GFR calc Af Amer: >60 mL/min (ref >60)
GFR calc non Af Amer: >60 mL/min (ref >60)
Glucose, Bld: 104 mg/dL — ABNORMAL HIGH (ref 70–99)
Potassium: 4 mmol/L (ref 3.5–5.1)
Sodium: 131 mmol/L — ABNORMAL LOW (ref 135–145)
Total Bilirubin: 0.6 mg/dL (ref 0.3–1.2)
Total Protein: 7.2 g/dL (ref 6.5–8.1)

## 2019-08-06 LAB — CBC WITH DIFFERENTIAL/PLATELET
Abs Immature Granulocytes: 0.06 10*3/uL (ref 0.00–0.07)
Basophils Absolute: 0 10*3/uL (ref 0.0–0.1)
Basophils Relative: 0 %
Eosinophils Absolute: 0.1 10*3/uL (ref 0.0–0.5)
Eosinophils Relative: 1 %
HCT: 32.4 % — ABNORMAL LOW (ref 39.0–52.0)
Hemoglobin: 10.6 g/dL — ABNORMAL LOW (ref 13.0–17.0)
Immature Granulocytes: 1 %
Lymphocytes Relative: 23 %
Lymphs Abs: 2.6 10*3/uL (ref 0.7–4.0)
MCH: 35.8 pg — ABNORMAL HIGH (ref 26.0–34.0)
MCHC: 32.7 g/dL (ref 30.0–36.0)
MCV: 109.5 fL — ABNORMAL HIGH (ref 80.0–100.0)
Monocytes Absolute: 1.2 10*3/uL — ABNORMAL HIGH (ref 0.1–1.0)
Monocytes Relative: 11 %
Neutro Abs: 7.6 10*3/uL (ref 1.7–7.7)
Neutrophils Relative %: 64 %
Platelets: 164 10*3/uL (ref 150–400)
RBC: 2.96 MIL/uL — ABNORMAL LOW (ref 4.22–5.81)
RDW: 14.8 % (ref 11.5–15.5)
WBC: 11.6 10*3/uL — ABNORMAL HIGH (ref 4.0–10.5)
nRBC: 0 % (ref 0.0–0.2)

## 2019-08-06 MED ORDER — HEPARIN SOD (PORK) LOCK FLUSH 100 UNIT/ML IV SOLN
500.0000 [IU] | Freq: Once | INTRAVENOUS | Status: AC
  Administered 2019-08-06: 500 [IU] via INTRAVENOUS

## 2019-08-06 MED ORDER — SODIUM CHLORIDE 0.9% FLUSH
10.0000 mL | Freq: Once | INTRAVENOUS | Status: AC
  Administered 2019-08-06: 10 mL via INTRAVENOUS

## 2019-08-06 NOTE — Progress Notes (Signed)
Duane Alexander, Duane Alexander   CLINIC:  Medical Oncology/Hematology  PCP:  Duane Hurl, PA-C [address] / [place] [address] [phone]   REASON FOR VISIT:  Follow-up for pancreatic cancer  PRIOR THERAPY: None  NGS Results: Not done  CURRENT THERAPY: FOLFIRINOX & Aloxi  BRIEF ONCOLOGIC HISTORY:  Oncology History  Pancreatic cancer ([place])  03/28/2019 Initial Diagnosis   Pancreatic cancer ([place])   03/28/2019 Cancer Staging   Staging form: Exocrine Pancreas, AJCC 8th Edition - Clinical stage from 03/28/2019: Stage III (cT4, cN1, cM0) - Signed by Duane Jack, MD on 03/28/2019   04/20/2019 Genetic Testing   Negative genetic testing:  No pathogenic variants detected on the [facility] Multi-Cancer panel. The report date is 04/20/2019.  The Multi-Cancer Panel offered by [facility] includes sequencing and/or deletion duplication testing of the following 85 genes: AIP, ALK, APC, ATM, AXIN2,BAP1,  BARD1, BLM, BMPR1A, BRCA1, BRCA2, BRIP1, CASR, CDC73, CDH1, CDK4, CDKN1B, CDKN1C, CDKN2A (p14ARF), CDKN2A (p16INK4a), CEBPA, CHEK2, CTNNA1, DICER1, DIS3L2, EGFR (c.2369C>T, p.Thr790Met variant only), EPCAM (Deletion/duplication testing only), FH, FLCN, GATA2, GPC3, GREM1 (Promoter region deletion/duplication testing only), HOXB13 (c.251G>A, p.Gly84Glu), HRAS, KIT, MAX, MEN1, MET, MITF (c.952G>A, p.Glu318Lys variant only), MLH1, MSH2, MSH3, MSH6, MUTYH, NBN, NF1, NF2, NTHL1, PALB2, PDGFRA, PHOX2B, PMS2, POLD1, POLE, POT1, PRKAR1A, PTCH1, PTEN, RAD50, RAD51C, RAD51D, RB1, RECQL4, RET, RNF43, RUNX1, SDHAF2, SDHA (sequence changes only), SDHB, SDHC, SDHD, SMAD4, SMARCA4, SMARCB1, SMARCE1, STK11, SUFU, TERC, TERT, TMEM127, TP53, TSC1, TSC2, VHL, WRN and WT1.   04/23/2019 -  Chemotherapy   The patient had palonosetron (ALOXI) injection 0.25 mg, 0.25 mg, Intravenous,  Once, 7 of 8 cycles Administration: 0.25 mg (04/23/2019), 0.25 mg (05/07/2019), 0.25 mg  (05/21/2019), 0.25 mg (06/04/2019), 0.25 mg (06/18/2019), 0.25 mg (07/02/2019), 0.25 mg (07/23/2019) pegfilgrastim-cbqv (UDENYCA) injection 6 mg, 6 mg, Subcutaneous, Once, 7 of 8 cycles Administration: 6 mg (04/25/2019), 6 mg (05/09/2019), 6 mg (05/23/2019), 6 mg (06/06/2019), 6 mg (06/20/2019), 6 mg (07/04/2019), 6 mg (07/25/2019) irinotecan (CAMPTOSAR) 240 mg in sodium chloride 0.9 % 500 mL chemo infusion, 120 mg/m2 = 240 mg (80 % of original dose 150 mg/m2), Intravenous,  Once, 7 of 8 cycles Dose modification: 120 mg/m2 (80 % of original dose 150 mg/m2, Cycle 1, Reason: Other (see comments), Comment: cirrhosis) Administration: 240 mg (04/23/2019), 280 mg (05/07/2019), 280 mg (05/21/2019), 280 mg (06/04/2019), 280 mg (06/18/2019), 280 mg (07/02/2019), 280 mg (07/23/2019) oxaliplatin (ELOXATIN) 130 mg in dextrose 5 % 500 mL chemo infusion, 68 mg/m2 = 130 mg (80 % of original dose 85 mg/m2), Intravenous,  Once, 7 of 8 cycles Dose modification: 68 mg/m2 (80 % of original dose 85 mg/m2, Cycle 1, Reason: Other (see comments), Comment: cirrhosis), 68 mg/m2 (80 % of original dose 85 mg/m2, Cycle 2, Reason: Provider Judgment), 68 mg/m2 (80 % of original dose 85 mg/m2, Cycle 3, Reason: Other (see comments), Comment: neuropathy), 55 mg/m2 (original dose 85 mg/m2, Cycle 5, Reason: Other (see comments), Comment: worsening neuropathy) Administration: 130 mg (04/23/2019), 130 mg (05/07/2019), 130 mg (05/21/2019), 130 mg (06/04/2019), 100 mg (06/18/2019), 100 mg (07/02/2019), 100 mg (07/23/2019) fosaprepitant (EMEND) 150 mg in sodium chloride 0.9 % 145 mL IVPB, 150 mg, Intravenous,  Once, 7 of 8 cycles Administration: 150 mg (04/23/2019), 150 mg (05/07/2019), 150 mg (05/21/2019), 150 mg (06/04/2019), 150 mg (06/18/2019), 150 mg (07/02/2019), 150 mg (07/23/2019) fluorouracil (ADRUCIL) 3,700 mg in sodium chloride 0.9 % 76 mL chemo infusion, 1,920 mg/m2 = 3,700 mg (80 %  of original dose 2,400 mg/m2), Intravenous, 1 Day/Dose, 7 of 8 cycles Dose modification:  1,920 mg/m2 (80 % of original dose 2,400 mg/m2, Cycle 1, Reason: Other (see comments), Comment: cirrhosis) Administration: 3,700 mg (04/23/2019), 4,650 mg (05/07/2019), 4,650 mg (05/21/2019), 4,650 mg (06/04/2019), 4,650 mg (06/18/2019), 4,650 mg (07/02/2019), 4,650 mg (07/23/2019) leucovorin 618 mg in sodium chloride 0.9 % 250 mL infusion, 320 mg/m2 = 618 mg (80 % of original dose 400 mg/m2), Intravenous,  Once, 7 of 8 cycles Dose modification: 320 mg/m2 (80 % of original dose 400 mg/m2, Cycle 1, Reason: Other (see comments), Comment: cirrhosis) Administration: 618 mg (04/23/2019), 772 mg (05/07/2019), 772 mg (05/21/2019), 772 mg (06/04/2019), 772 mg (06/18/2019), 772 mg (07/02/2019), 772 mg (07/23/2019)  for chemotherapy treatment.      CANCER STAGING: Cancer Staging Pancreatic cancer [facility]) Staging form: Exocrine Pancreas, AJCC 8th Edition - Clinical stage from 03/28/2019: Stage III (cT4, cN1, cM0) - Signed by Duane Jack, MD on 03/28/2019   INTERVAL HISTORY:  Duane Alexander, a 64 y.o. male, returns for routine follow-up and consideration for next cycle of chemotherapy. Duane Alexander was last seen on 07/23/2019.  Due for cycle #8 of FOLFIRINOX & Aloxi today.   Today he complains of constant periumbilical pain since Sunday, 08/01. He started having pain since eating spicy pasta on Sunday afternoon. He had a BM this morning. He continues taking oxycodone q6h, though it does not last the full 6 hours.  Overall, he feels ready for next cycle of chemo today.    REVIEW OF SYSTEMS:  Review of Systems  Constitutional: Negative for appetite change and fatigue.  Gastrointestinal: Positive for abdominal pain (10/10 abdominal pain).  Neurological: Positive for numbness (hands & feet).  Psychiatric/Behavioral: Positive for sleep disturbance.  All other systems reviewed and are negative.   PAST MEDICAL/SURGICAL HISTORY:  Past Medical History:  Diagnosis Date  . Aortic atherosclerosis ([place])   . COPD  (chronic obstructive pulmonary disease) ([place])    per CT findings 2019  . Family history of brain cancer   . Family history of colon cancer   . Family history of kidney cancer   . Family history of leukemia   . Family history of lung cancer   . Family history of ovarian cancer   . Family history of prostate cancer   . Family history of stomach cancer   . GERD (gastroesophageal reflux disease)   . Hepatitis C    referred to hepatitis clinic 2019 but he never went  . Hyperlipidemia   . Hypertension   . Port-A-Cath in place 04/17/2019  . Smoker   . Wears glasses    Past Surgical History:  Procedure Laterality Date  . ANTERIOR CERVICAL DECOMP/DISCECTOMY FUSION  12/2017   Dr. Consuella Lose  . BIOPSY  03/11/2019   Procedure: BIOPSY;  Surgeon: [name]., MD;  Location: [facility];  Service: Gastroenterology;;  . COLONOSCOPY N/A 01/19/2017   Procedure: COLONOSCOPY;  Surgeon: Rogene Houston, MD;  [facility];  Service: Endoscopy;  Laterality: N/A;  930-moved to 9:45 per Lelon Frohlich  . ESOPHAGOGASTRODUODENOSCOPY (EGD) WITH PROPOFOL N/A 03/11/2019   Procedure: ESOPHAGOGASTRODUODENOSCOPY (EGD) WITH PROPOFOL;  Surgeon: Rush Landmark Telford Nab., MD;  Location: [facility]COPY;  Service: Gastroenterology;  Laterality: N/A;  . EUS N/A 03/11/2019   Procedure: UPPER ENDOSCOPIC ULTRASOUND (EUS) RADIAL;  Surgeon: Rush Landmark Telford Nab., MD;  Location: [facility] [facility];  Service: Gastroenterology;  Laterality: N/A;  . FINE NEEDLE ASPIRATION  03/11/2019   Procedure: FINE NEEDLE ASPIRATION (  FNA) LINEAR;  Surgeon: [name]., MD;  Location: [facility];  Service: Gastroenterology;;  . POLYPECTOMY  01/19/2017   Procedure: POLYPECTOMY;  Surgeon: Rogene Houston, MD;  [facility];  Service: Endoscopy;;  splenic flexure  . POLYPECTOMY  03/11/2019   Procedure: POLYPECTOMY;  Surgeon: Mansouraty, Telford Nab., MD;  Location: [facility];  Service: Gastroenterology;;  . Sol Passer  PLACEMENT Left 04/15/2019   Procedure: INSERTION PORT-A-CATH;  Surgeon: Aviva Signs, MD;  Location: [facility];  Service: General;  Laterality: Left;  . Right eye surgery     as a child-removed muscle from leg and put in upper eye lid    SOCIAL HISTORY:  Social History   Socioeconomic History  . Marital status: Widowed    Spouse name: Not on file  . Number of children: Not on file  . Years of education: Not on file  . Highest education level: Not on file  Occupational History    Employer: [employer]  Tobacco Use  . Smoking status: Current Every Day Smoker    Packs/day: 0.50    Years: 45.00    Pack years: 22.50    Types: Cigarettes  . Smokeless tobacco: Never Used  Vaping Use  . Vaping Use: Never used  Substance and Sexual Activity  . Alcohol use: Not Currently  . Drug use: No  . Sexual activity: Not Currently  Other Topics Concern  . Not on file  Social History Narrative  . Not on file   Social Determinants of Health   Financial Resource Strain: Low Risk   . Difficulty of Paying Living Expenses: Not hard at all  Food Insecurity: No Food Insecurity  . Worried About Charity fundraiser in the Last Year: Never true  . Ran Out of Food in the Last Year: Never true  Transportation Needs: No Transportation Needs  . Lack of Transportation (Medical): No  . Lack of Transportation (Non-Medical): No  Physical Activity: Inactive  . Days of Exercise per Week: 0 days  . Minutes of Exercise per Session: 0 min  Stress: Stress Concern Present  . Feeling of Stress : Rather much  Social Connections: Socially Isolated  . Frequency of Communication with Friends and Family: Three times a week  . Frequency of Social Gatherings with Friends and Family: Three times a week  . Attends Religious Services: Never  . Active Member of Clubs or Organizations: No  . Attends Archivist Meetings: Never  . Marital Status: Widowed  Intimate Partner Violence: Not At Risk  . Fear of  Current or Ex-Partner: No  . Emotionally Abused: No  . Physically Abused: No  . Sexually Abused: No    FAMILY HISTORY:  Family History  Problem Relation Age of Onset  . Throat cancer Father 48       died in his 58s of stomach issues?  . Lung cancer Brother 56       smoker, non-small cell  . Ovarian cancer Sister 20       GI related death  . Heart attack Maternal Grandmother   . Brain cancer Maternal Aunt        dx. in her 47s  . Lung cancer Maternal Uncle        dx. in his 37s-60s, smoker  . Stomach cancer Maternal Aunt        dx. in her 98s  . Colon cancer Cousin        dx. >50 - maternal cousin  .  Kidney cancer Cousin        dx. in his 72s - maternal cousin  . Prostate cancer Cousin        dx. in his 76s - maternal cousin  . Leukemia Nephew 34  . Heart disease Neg Hx   . Hypertension Neg Hx   . Stroke Neg Hx     CURRENT MEDICATIONS:  Current Outpatient Medications  Medication Sig Dispense Refill  . docusate sodium (COLACE) 100 MG capsule Take 100 mg by mouth daily.    . DULoxetine (CYMBALTA) 30 MG capsule TAKE 1 CAPSULE(30 MG) BY MOUTH DAILY 30 capsule 0  . fluorouracil CALGB 30865 in sodium chloride 0.9 % 150 mL Inject 2,400 mg/m2 into the vein over 48 hr.    . FLUOROURACIL IV Inject into the vein every 21 ( twenty-one) days.    . furosemide (LASIX) 20 MG tablet Take 1 tablet (20 mg total) by mouth daily as needed. 30 tablet 1  . IRINOTECAN HCL IV Inject into the vein every 14 (fourteen) days.    [name] LEUCOVORIN CALCIUM IV Inject into the vein every 14 (fourteen) days.    [name] lidocaine-prilocaine (EMLA) cream Apply a small amount to port a cath site and cover with plastic wrap one hour prior to chemotherapy appointments 30 g 3  . lisinopril (ZESTRIL) 20 MG tablet TAKE 1 TABLET BY MOUTH DAILY 90 tablet 0  . loperamide (IMODIUM A-D) 2 MG tablet Take 2 at onset of diarrhea, then 1 tab after every watery bowel movement - DO NOT EXCEED 8 tablets in a 24 hour period 100 tablet 1   . lovastatin (MEVACOR) 20 MG tablet TAKE 1 TABLET BY MOUTH AT BEDTIME 90 tablet 0  . melatonin 5 MG TABS Take 5 mg by mouth at bedtime.    . Multiple Vitamin (MULTIVITAMIN) tablet Take 1 tablet by mouth daily.    . OXALIPLATIN IV Inject into the vein every 14 (fourteen) days.    . Oxycodone HCl 10 MG TABS Take 1 tablet (10 mg total) by mouth every 4 (four) hours as needed. 84 tablet 0  . prochlorperazine (COMPAZINE) 10 MG tablet Take 1 tablet (10 mg total) by mouth every 6 (six) hours as needed (Nausea or vomiting). 60 tablet 3   No current facility-administered medications for this visit.    ALLERGIES:  No Known Allergies  PHYSICAL EXAM:  Performance status (ECOG): 1 - Symptomatic but completely ambulatory  Vitals:   08/06/19 0927  BP: (!) 86/66  Pulse: 67  Resp: 18  Temp: 97.8 F (36.6 C)  SpO2: 100%   Wt Readings from Last 3 Encounters:  08/06/19 154 lb 11.2 oz (70.2 kg)  07/23/19 165 lb 2 oz (74.9 kg)  07/16/19 157 lb 11.2 oz (71.5 kg)   Physical Exam Vitals reviewed.  Constitutional:      Appearance: Normal appearance.  Cardiovascular:     Rate and Rhythm: Normal rate and regular rhythm.     Pulses: Normal pulses.     Heart sounds: Normal heart sounds.  Pulmonary:     Effort: Pulmonary effort is normal.     Breath sounds: Examination of the right-upper field reveals rhonchi. Examination of the left-upper field reveals rhonchi. Examination of the right-middle field reveals rhonchi. Examination of the left-middle field reveals rhonchi. Rhonchi present.  Chest:     Comments: Port-a-Cath on L chest Abdominal:     Palpations: Abdomen is soft. There is no mass.     Tenderness: There is abdominal  tenderness in the periumbilical area and left upper quadrant.  Musculoskeletal:     Right lower leg: Edema (1+) present.     Left lower leg: Edema (1+) present.  Lymphadenopathy:     Lower Body: No right inguinal adenopathy. No left inguinal adenopathy.  Neurological:      General: No focal deficit present.     Mental Status: He is alert and oriented to person, place, and time.  Psychiatric:        Mood and Affect: Mood normal.        Behavior: Behavior normal.     LABORATORY DATA:  I have reviewed the labs as listed.  CBC Latest Ref Rng & Units 08/06/2019 07/23/2019 07/16/2019  WBC 4.0 - 10.5 K/uL 11.6(H) 8.6 17.3(H)  Hemoglobin 13.0 - 17.0 g/dL 10.6(L) 10.0(L) 10.9(L)  Hematocrit 39 - 52 % 32.4(L) 30.9(L) 33.3(L)  Platelets 150 - 400 K/uL 164 202 177   CMP Latest Ref Rng & Units 08/06/2019 07/23/2019 07/16/2019  Glucose 70 - 99 mg/dL 104(H) 106(H) 99  BUN 8 - 23 mg/dL '17 10 10  '$ Creatinine 0.61 - 1.24 mg/dL 0.67 0.70 0.66  Sodium 135 - 145 mmol/L 131(L) 138 138  Potassium 3.5 - 5.1 mmol/L 4.0 4.2 3.6  Chloride 98 - 111 mmol/L 101 106 109  CO2 22 - 32 mmol/L '22 27 24  '$ Calcium 8.9 - 10.3 mg/dL 8.4(L) 8.7(L) 8.6(L)  Total Protein 6.5 - 8.1 g/dL 7.2 6.4(L) 6.7  Total Bilirubin 0.3 - 1.2 mg/dL 0.6 0.7 0.6  Alkaline Phos 38 - 126 U/L 99 100 119  AST 15 - 41 U/L 42(H) 37 33  ALT 0 - 44 U/L 31 27 37   Lab Results  Component Value Date   CAN199 200 (H) 07/16/2019   TOI712 40 (H) 02/13/2019    DIAGNOSTIC IMAGING:  I have independently reviewed the scans and discussed with the patient. CT Abdomen Pelvis W Wo Contrast  Result Date: 07/12/2019 CLINICAL DATA:  Pancreatic adenocarcinoma, current chemotherapy. EXAM: CT CHEST WITH CONTRAST CT ABDOMEN WITHOUT AND WITH CONTRAST TECHNIQUE: Multidetector CT imaging of the abdomen was performed without intravenous contrast. Multidetector CT imaging of the chest and abdomen was then performed during bolus administration of intravenous contrast. CONTRAST:  16m OMNIPAQUE IOHEXOL 300 MG/ML  SOLN COMPARISON:  Multiple exams, including 03/28/2019 and 03/12/2019 FINDINGS: CT CHEST WITH CONTRAST Cardiovascular: Left Port-A-Cath tip: SVC. Coronary, aortic arch, and branch vessel atherosclerotic vascular disease. Mediastinum/Nodes:  Stable left paratracheal lymph nodes are within normal size limits, stable from before, not previously hypermetabolic. A 0.5 cm left internal mammary lymph node is likewise stable and was not previously hypermetabolic. Lungs/Pleura: Mild bilateral airway thickening. Otherwise unremarkable. Musculoskeletal: Thoracic spondylosis. CT ABDOMEN WITHOUT AND WITH CONTRAST Hepatobiliary: Hepatic morphology suggests early cirrhosis. Borderline wall thickening in the gallbladder. No abnormal arterial phase enhancement in the liver. The common hepatic duct measures up to 1.0 cm in diameter. The proximal common bile duct is indistinct, distal CBD measures 0.6 cm in diameter. Pancreas: Dorsal pancreatic duct dilatation in the pancreatic tail extending to a 2.1 by 2.2 by 3.6 cm hypoenhancing mass in the pancreatic body. Although hypoenhancing on the arterial phase images, the best contrast between the mass and the surrounding pancreatic parenchyma is on the portal venous phase images. This mass appears to have enlarged, by my measurements the main mass was previously 2.1 by 1.6 by 2.0 cm on 03/12/2019. The mass narrows and directly abuts the portal vein at its junction with the  splenic vein and SMV, and also narrows the SMV. There is adjacent scattered right gastric and peripancreatic adenopathy with 1 peripancreatic lymph node measuring 1.1 cm in short axis on image 168/11, previously 1.3 cm by my measurements. The scattered small right gastric lymph nodes appear roughly stable. The upper margin of the mass abuts the splenic artery and some of the local lymph nodes are near the common hepatic artery. No variant hepatic artery appearance. Spleen: The spleen measures 9.8 by 5.1 by 11.4 cm (volume = 300 cm^3), within normal limits. No focal splenic lesion. Adrenals/Urinary Tract: Stable fullness of the lateral limb left adrenal gland without a discrete mass. Right adrenal gland normal. No urinary tract calculi are identified. The  urinary bladder appears unremarkable. Stomach/Bowel: The pancreatic mass abuts the posterior margin of the stomach as shown on image 179/11, without definite intact fat plane but also without definite indicators of invasion. Air fluid levels in the distal colon suggesting diarrheal process. Vascular/Lymphatic: Aortoiliac atherosclerotic vascular disease. Mild peripancreatic adenopathy as noted above. Narrowing of the portal vein at the confluence of the splenic and SMV as noted above. Other: The omentum or trace pelvic ascites. No definite nodularity along mesenteric. Small central mesenteric lymph nodes. Musculoskeletal: Lumbar spondylosis and degenerative disc disease causing mild multilevel impingement. IMPRESSION: 1. Enlarging hypoenhancing mass in the pancreatic body, currently measuring 2.1 by 2.2 by 3.6 cm, with dorsal pancreatic duct dilatation in the pancreatic tail. The mass narrows and directly abuts the portal vein at its junction with the splenic vein and SMV, and also narrows the SMV. 2. Mild peripancreatic adenopathy. 3. Other imaging findings of potential clinical significance: Coronary atherosclerosis. Mild bilateral airway thickening is present, suggesting bronchitis or reactive airways disease. Hepatic morphology suggests early cirrhosis. Lumbar spondylosis and degenerative disc disease causing mild multilevel impingement. Borderline wall thickening in the gallbladder. Air fluid levels in the distal colon suggesting diarrheal process. 4. Aortic atherosclerosis. Aortic Atherosclerosis (ICD10-I70.0). Electronically Signed   By: Gaylyn Rong M.D.   On: 07/12/2019 12:19   CT Chest W Contrast  Result Date: 07/12/2019 CLINICAL DATA:  Pancreatic adenocarcinoma, current chemotherapy. EXAM: CT CHEST WITH CONTRAST CT ABDOMEN WITHOUT AND WITH CONTRAST TECHNIQUE: Multidetector CT imaging of the abdomen was performed without intravenous contrast. Multidetector CT imaging of the chest and abdomen was  then performed during bolus administration of intravenous contrast. CONTRAST:  OMNIPAQUE IOHEXOL 300 MG/ML  SOLN COMPARISON:  Multiple exams, including 03/28/2019 and 03/12/2019 FINDINGS: CT CHEST WITH CONTRAST Cardiovascular: Left Port-A-Cath tip: SVC. Coronary, aortic arch, and branch vessel atherosclerotic vascular disease. Mediastinum/Nodes: Stable left paratracheal lymph nodes are within normal size limits, stable from before, not previously hypermetabolic. A 0.5 cm left internal mammary lymph node is likewise stable and was not previously hypermetabolic. Lungs/Pleura: Mild bilateral airway thickening. Otherwise unremarkable. Musculoskeletal: Thoracic spondylosis. CT ABDOMEN WITHOUT AND WITH CONTRAST Hepatobiliary: Hepatic morphology suggests early cirrhosis. Borderline wall thickening in the gallbladder. No abnormal arterial phase enhancement in the liver. The common hepatic duct measures up to 1.0 cm in diameter. The proximal common bile duct is indistinct, distal CBD measures 0.6 cm in diameter. Pancreas: Dorsal pancreatic duct dilatation in the pancreatic tail extending to a 2.1 by 2.2 by 3.6 cm hypoenhancing mass in the pancreatic body. Although hypoenhancing on the arterial phase images, the best contrast between the mass and the surrounding pancreatic parenchyma is on the portal venous phase images. This mass appears to have enlarged, by my measurements the main mass was previously 2.1 by  1.6 by 2.0 cm on 03/12/2019. The mass narrows and directly abuts the portal vein at its junction with the splenic vein and SMV, and also narrows the SMV. There is adjacent scattered right gastric and peripancreatic adenopathy with 1 peripancreatic lymph node measuring 1.1 cm in short axis on image 168/11, previously 1.3 cm by my measurements. The scattered small right gastric lymph nodes appear roughly stable. The upper margin of the mass abuts the splenic artery and some of the local lymph nodes are near the  common hepatic artery. No variant hepatic artery appearance. Spleen: The spleen measures 9.8 by 5.1 by 11.4 cm (volume = 300 cm^3), within normal limits. No focal splenic lesion. Adrenals/Urinary Tract: Stable fullness of the lateral limb left adrenal gland without a discrete mass. Right adrenal gland normal. No urinary tract calculi are identified. The urinary bladder appears unremarkable. Stomach/Bowel: The pancreatic mass abuts the posterior margin of the stomach as shown on image 179/11, without definite intact fat plane but also without definite indicators of invasion. Air fluid levels in the distal colon suggesting diarrheal process. Vascular/Lymphatic: Aortoiliac atherosclerotic vascular disease. Mild peripancreatic adenopathy as noted above. Narrowing of the portal vein at the confluence of the splenic and SMV as noted above. Other: The omentum or trace pelvic ascites. No definite nodularity along mesenteric. Small central mesenteric lymph nodes. Musculoskeletal: Lumbar spondylosis and degenerative disc disease causing mild multilevel impingement. IMPRESSION: 1. Enlarging hypoenhancing mass in the pancreatic body, currently measuring 2.1 by 2.2 by 3.6 cm, with dorsal pancreatic duct dilatation in the pancreatic tail. The mass narrows and directly abuts the portal vein at its junction with the splenic vein and SMV, and also narrows the SMV. 2. Mild peripancreatic adenopathy. 3. Other imaging findings of potential clinical significance: Coronary atherosclerosis. Mild bilateral airway thickening is present, suggesting bronchitis or reactive airways disease. Hepatic morphology suggests early cirrhosis. Lumbar spondylosis and degenerative disc disease causing mild multilevel impingement. Borderline wall thickening in the gallbladder. Air fluid levels in the distal colon suggesting diarrheal process. 4. Aortic atherosclerosis. Aortic Atherosclerosis (ICD10-I70.0). Electronically Signed   By: Van Clines  M.D.   On: 07/12/2019 12:19     ASSESSMENT:  1. Pancreatic adenocarcinoma (UT 4 N1 MX): -PET scan on 03/28/2019 did not show any evidence of metastatic disease. -CA 19-9 was 40 on 02/23/2019. -Germline mutation testing was negative. -6 cycles of FOLFIRINOX from 04/23/2019 through 07/02/2019. -CT CAP on 07/11/2019 showed enlarging mass in the pancreatic body measuring 2.1 x 2.2 x 3.6 cm. The mass narrows and directly abuts the portal vein at its junction with the splenic vein and SMV and also narrows the SMV. Mild peripancreatic adenopathy more or less stable. No new lesions.  2. Epigastric pain: -This is fairly improved since the start of treatments. -He is using hydrocodone 5/325 as needed. Some days he does not require any pain medication.  3. Liver disease: -CT scan showed mild degree of cirrhosis. History of hepatitis C which was untreated.   PLAN:  1. Pancreatic adenocarcinoma: -We have a given 1 more cycle of chemotherapy 2 weeks ago at the request of Dr. Barry Dienes. -His labs today shows mild elevation of AST at 42.  Otherwise the rest of the labs are within normal limits. -He has an appointment to see Dr. Barry Dienes on Friday.  Surgery will be scheduled at that visit. -I plan to see him back in 4 weeks after surgery.  2. Tingling/numbness: -He has some numbness in the fingertips and toes from spine problem.  He stopped taking gabapentin as he could not tolerate.  3. Epigastric pain: -This has slightly worsened since Saturday. -I have told him to increase oxycodone 10 mg every 4 hours as needed.   Orders placed this encounter:  No orders of the defined types were placed in this encounter.    Duane Jack, MD [place] [phone]   I, Milinda Antis, am acting as a scribe for Dr. Sanda Linger.  I, Duane Jack MD, have reviewed the above documentation for accuracy and completeness, and I agree with the above.

## 2019-08-06 NOTE — Patient Instructions (Signed)
[place] at [facility] Discharge Instructions  You were seen today by Dr. Delton Coombes. He went over your recent results. You did not receive your treatment today.  Take the oxycodone every 4 hours as needed for the pain. Dr. Delton Coombes will tentatively see you back in 6 weeks for labs and follow up.   Thank you for choosing [place] at [facility] to provide your oncology and hematology care.  To afford each patient quality time with our provider, please arrive at least 15 minutes before your scheduled appointment time.   If you have a lab appointment with the [facility] please come in thru the Main Entrance and check in at the main information desk  You need to re-schedule your appointment should you arrive 10 or more minutes late.  We strive to give you quality time with our providers, and arriving late affects you and other patients whose appointments are after yours.  Also, if you no show three or more times for appointments you may be dismissed from the clinic at the providers discretion.     Again, thank you for choosing [facility].  Our hope is that these requests will decrease the amount of time that you wait before being seen by our physicians.       _____________________________________________________________  Should you have questions after your visit to [facility], please contact our office at [phone] between the hours of 8:00 a.m. and 4:30 p.m.  Voicemails left after 4:00 p.m. will not be returned until the following business day.  For prescription refill requests, have your pharmacy contact our office and allow 72 hours.    Cancer Center Support Programs:   > Cancer Support Group  2nd Tuesday of the month 1pm-2pm, Journey Room

## 2019-08-06 NOTE — Progress Notes (Deleted)
Patient has been assessed, vital signs and labs have been reviewed by Dr. Delton Coombes. HOLD treatment today per Dr. Delton Coombes.

## 2019-08-06 NOTE — Progress Notes (Signed)
Patients port flushed without difficulty.  Good blood return noted with no bruising or swelling noted at site.  Band aid applied.  VSS with discharge and left ambulatory with no s/s of distress noted.  

## 2019-08-08 ENCOUNTER — Encounter (HOSPITAL_COMMUNITY): Payer: Self-pay | Admitting: *Deleted

## 2019-08-08 ENCOUNTER — Other Ambulatory Visit (HOSPITAL_COMMUNITY): Payer: Self-pay

## 2019-08-08 ENCOUNTER — Encounter (HOSPITAL_COMMUNITY): Payer: Self-pay

## 2019-08-09 ENCOUNTER — Encounter (HOSPITAL_COMMUNITY): Payer: Self-pay | Admitting: *Deleted

## 2019-08-09 ENCOUNTER — Other Ambulatory Visit (HOSPITAL_COMMUNITY): Payer: Self-pay | Admitting: *Deleted

## 2019-08-09 ENCOUNTER — Other Ambulatory Visit (HOSPITAL_COMMUNITY): Payer: Self-pay

## 2019-08-09 ENCOUNTER — Other Ambulatory Visit: Payer: Self-pay | Admitting: General Surgery

## 2019-08-09 DIAGNOSIS — C250 Malignant neoplasm of head of pancreas: Secondary | ICD-10-CM | POA: Diagnosis not present

## 2019-08-09 MED ORDER — OXYCODONE HCL 5 MG PO TABS: ORAL_TABLET | ORAL | 0 refills | Status: AC

## 2019-08-09 MED ORDER — OXYCODONE HCL 10 MG PO TABS: 10.0000 mg | ORAL_TABLET | ORAL | 0 refills | Status: DC | PRN

## 2019-08-12 ENCOUNTER — Encounter (HOSPITAL_COMMUNITY): Payer: Self-pay | Admitting: *Deleted

## 2019-08-12 NOTE — Progress Notes (Signed)
Received mychart message from patient's caregiver that:  "Duane Alexander is having his surgery on Sept. 23rd 2021"  Nothing further is needed at this time.

## 2019-08-19 DIAGNOSIS — C250 Malignant neoplasm of head of pancreas: Secondary | ICD-10-CM | POA: Diagnosis not present

## 2019-08-20 ENCOUNTER — Other Ambulatory Visit (HOSPITAL_COMMUNITY): Payer: Self-pay

## 2019-08-20 ENCOUNTER — Inpatient Hospital Stay (HOSPITAL_BASED_OUTPATIENT_CLINIC_OR_DEPARTMENT_OTHER): Payer: BC Managed Care – PPO | Admitting: Hematology

## 2019-08-20 ENCOUNTER — Encounter (HOSPITAL_COMMUNITY): Payer: Self-pay

## 2019-08-20 ENCOUNTER — Other Ambulatory Visit: Payer: Self-pay

## 2019-08-20 ENCOUNTER — Inpatient Hospital Stay (HOSPITAL_COMMUNITY): Payer: BC Managed Care – PPO

## 2019-08-20 ENCOUNTER — Ambulatory Visit (HOSPITAL_COMMUNITY)
Admission: RE | Admit: 2019-08-20 | Discharge: 2019-08-20 | Disposition: A | Discharge status: Home / Self Care | Payer: BC Managed Care – PPO | Source: Ambulatory Visit | Attending: Hematology | Admitting: Hematology

## 2019-08-20 ENCOUNTER — Encounter (HOSPITAL_COMMUNITY): Payer: Self-pay | Admitting: *Deleted

## 2019-08-20 DIAGNOSIS — K8689 Other specified diseases of pancreas: Secondary | ICD-10-CM | POA: Diagnosis not present

## 2019-08-20 DIAGNOSIS — C250 Malignant neoplasm of head of pancreas: Secondary | ICD-10-CM

## 2019-08-20 DIAGNOSIS — C251 Malignant neoplasm of body of pancreas: Secondary | ICD-10-CM | POA: Diagnosis not present

## 2019-08-20 DIAGNOSIS — Z79899 Other long term (current) drug therapy: Secondary | ICD-10-CM | POA: Diagnosis not present

## 2019-08-20 DIAGNOSIS — Z8042 Family history of malignant neoplasm of prostate: Secondary | ICD-10-CM | POA: Diagnosis not present

## 2019-08-20 DIAGNOSIS — K828 Other specified diseases of gallbladder: Secondary | ICD-10-CM | POA: Diagnosis not present

## 2019-08-20 DIAGNOSIS — K769 Liver disease, unspecified: Secondary | ICD-10-CM | POA: Diagnosis not present

## 2019-08-20 DIAGNOSIS — Z8041 Family history of malignant neoplasm of ovary: Secondary | ICD-10-CM | POA: Diagnosis not present

## 2019-08-20 DIAGNOSIS — Z800 Family history of malignant neoplasm of digestive organs: Secondary | ICD-10-CM | POA: Diagnosis not present

## 2019-08-20 DIAGNOSIS — F17210 Nicotine dependence, cigarettes, uncomplicated: Secondary | ICD-10-CM | POA: Diagnosis not present

## 2019-08-20 DIAGNOSIS — I700 Atherosclerosis of aorta: Secondary | ICD-10-CM | POA: Diagnosis not present

## 2019-08-20 DIAGNOSIS — R1013 Epigastric pain: Secondary | ICD-10-CM | POA: Diagnosis not present

## 2019-08-20 DIAGNOSIS — R202 Paresthesia of skin: Secondary | ICD-10-CM | POA: Diagnosis not present

## 2019-08-20 DIAGNOSIS — Z8051 Family history of malignant neoplasm of kidney: Secondary | ICD-10-CM | POA: Diagnosis not present

## 2019-08-20 DIAGNOSIS — K7460 Unspecified cirrhosis of liver: Secondary | ICD-10-CM | POA: Diagnosis not present

## 2019-08-20 DIAGNOSIS — Z808 Family history of malignant neoplasm of other organs or systems: Secondary | ICD-10-CM | POA: Diagnosis not present

## 2019-08-20 DIAGNOSIS — Z806 Family history of leukemia: Secondary | ICD-10-CM | POA: Diagnosis not present

## 2019-08-20 LAB — COMPREHENSIVE METABOLIC PANEL
ALT: 43 U/L (ref 0–44)
AST: 66 U/L — ABNORMAL HIGH (ref 15–41)
Albumin: 3.4 g/dL — ABNORMAL LOW (ref 3.5–5.0)
Alkaline Phosphatase: 155 U/L — ABNORMAL HIGH (ref 38–126)
Anion gap: 12 (ref 5–15)
BUN: 15 mg/dL (ref 8–23)
CO2: 22 mmol/L (ref 22–32)
Calcium: 8.8 mg/dL — ABNORMAL LOW (ref 8.9–10.3)
Chloride: 101 mmol/L (ref 98–111)
Creatinine, Ser: 0.47 mg/dL — ABNORMAL LOW (ref 0.61–1.24)
GFR calc Af Amer: >60 mL/min (ref >60)
GFR calc non Af Amer: >60 mL/min (ref >60)
Glucose, Bld: 139 mg/dL — ABNORMAL HIGH (ref 70–99)
Potassium: 4 mmol/L (ref 3.5–5.1)
Sodium: 135 mmol/L (ref 135–145)
Total Bilirubin: 6.8 mg/dL — ABNORMAL HIGH (ref 0.3–1.2)
Total Protein: 7.6 g/dL (ref 6.5–8.1)

## 2019-08-20 LAB — CBC WITH DIFFERENTIAL/PLATELET
Abs Immature Granulocytes: 0.01 10*3/uL (ref 0.00–0.07)
Basophils Absolute: 0 10*3/uL (ref 0.0–0.1)
Basophils Relative: 0 %
Eosinophils Absolute: 0 10*3/uL (ref 0.0–0.5)
Eosinophils Relative: 0 %
HCT: 35.4 % — ABNORMAL LOW (ref 39.0–52.0)
Hemoglobin: 11.8 g/dL — ABNORMAL LOW (ref 13.0–17.0)
Immature Granulocytes: 0 %
Lymphocytes Relative: 15 %
Lymphs Abs: 1.3 10*3/uL (ref 0.7–4.0)
MCH: 35.4 pg — ABNORMAL HIGH (ref 26.0–34.0)
MCHC: 33.3 g/dL (ref 30.0–36.0)
MCV: 106.3 fL — ABNORMAL HIGH (ref 80.0–100.0)
Monocytes Absolute: 0.5 10*3/uL (ref 0.1–1.0)
Monocytes Relative: 6 %
Neutro Abs: 6.7 10*3/uL (ref 1.7–7.7)
Neutrophils Relative %: 79 %
Platelets: 224 10*3/uL (ref 150–400)
RBC: 3.33 MIL/uL — ABNORMAL LOW (ref 4.22–5.81)
RDW: 13.6 % (ref 11.5–15.5)
WBC: 8.4 10*3/uL (ref 4.0–10.5)
nRBC: 0 % (ref 0.0–0.2)

## 2019-08-20 LAB — BILIRUBIN, DIRECT: Bilirubin, Direct: 4.6 mg/dL — ABNORMAL HIGH (ref 0.0–0.2)

## 2019-08-20 MED ORDER — HYDROMORPHONE HCL 1 MG/ML IJ SOLN
2.0000 mg | Freq: Once | INTRAMUSCULAR | Status: AC
  Administered 2019-08-20: 2 mg via INTRAMUSCULAR
  Filled 2019-08-20: qty 2

## 2019-08-20 MED ORDER — IOHEXOL 300 MG/ML  SOLN
100.0000 mL | Freq: Once | INTRAMUSCULAR | Status: AC | PRN
  Administered 2019-08-20: 100 mL via INTRAVENOUS

## 2019-08-20 MED ORDER — SODIUM CHLORIDE 0.9% FLUSH
20.0000 mL | INTRAVENOUS | Status: DC | PRN
  Administered 2019-08-20: 20 mL via INTRAVENOUS

## 2019-08-20 MED ORDER — HYDROMORPHONE HCL 2 MG PO TABS: 2.0000 mg | ORAL_TABLET | ORAL | 0 refills | Status: DC | PRN

## 2019-08-20 MED ORDER — HEPARIN SOD (PORK) LOCK FLUSH 100 UNIT/ML IV SOLN
500.0000 [IU] | Freq: Once | INTRAVENOUS | Status: AC
  Administered 2019-08-20: 500 [IU] via INTRAVENOUS

## 2019-08-20 NOTE — Addendum Note (Signed)
Addended by: Wynona Neat on: 08/20/2019 03:33 PM   Modules accepted: Orders

## 2019-08-20 NOTE — Patient Instructions (Signed)
[place] at [facility] Discharge Instructions  You were seen today by Dr. Delton Coombes. He talked with you about your CT scans results.  It is showing a compression of your gallbladder common bile duct.  This is urgent for [place] to get this opened up.  We have spoken with Dr. Laural Golden and he has agreed to place a stent tomorrow morning.  Do not eat or drink anything after midnight, you can take your morning medications with a sip of water.  His office will call you in the morning and tell you what time to report to the hospital for the procedure.    We will also schedule a CT scan to look at your lungs more specifically because of the new areas found in the lungs.  We will schedule this in about 2 weeks but the more urgent thing to get done is the stent in the morning.    We will send you a new prescription for dilaudid.  You can take it every 3-4 hours.  It will help with your pain.    We will see you back in 2 weeks after the CT scan of the chest.    Thank you for choosing [place] at [facility] to provide your oncology and hematology care.  To afford each patient quality time with our provider, please arrive at least 15 minutes before your scheduled appointment time.   If you have a lab appointment with the [place] please come in thru the Main Entrance and check in at the main information desk.  You need to re-schedule your appointment should you arrive 10 or more minutes late.  We strive to give you quality time with our providers, and arriving late affects you and other patients whose appointments are after yours.  Also, if you no show three or more times for appointments you may be dismissed from the clinic at the providers discretion.     Again, thank you for choosing [facility].  Our hope is that these requests will decrease the amount of time that you wait before being seen by our physicians.        _____________________________________________________________  Should you have questions after your visit to [facility], please contact our office at [phone] and follow the prompts.  Our office hours are 8:00 a.m. and 4:30 p.m. Monday - Friday.  Please note that voicemails left after 4:00 p.m. may not be returned until the following business day.  We are closed weekends and major holidays.  You do have access to a nurse 24-7, just call the main number to the clinic [phone] and do not press any options, hold on the line and a nurse will answer the phone.    For prescription refill requests, have your pharmacy contact our office and allow 72 hours.    Due to Covid, you will need to wear a mask upon entering the hospital. If you do not have a mask, a mask will be given to you at the Main Entrance upon arrival. For doctor visits, patients may have 1 support person age 24 or older with them. For treatment visits, patients can not have anyone with them due to social distancing guidelines and our immunocompromised population.

## 2019-08-20 NOTE — Progress Notes (Signed)
Patient remains in department to await CT scan results. He is complaining of pain and request to lay down on his side for comfort. Dr. Delton Coombes aware and orders received for dilaudid 2mg  IM times one dose. Primary RN aware and pharmacy aware.

## 2019-08-20 NOTE — Progress Notes (Signed)
Orders placed for CBC, CMP, CA19-9 and STAT CT abdomen pelvis with contrast. Patient's sister aware that patient has an appt for PF with labs today at 1100.

## 2019-08-20 NOTE — Progress Notes (Signed)
Order Change: Orders in place for CBC, CMP and LFTs per Dr. Delton Coombes. CA19-9 has been discontinued per Dr. Delton Coombes.

## 2019-08-20 NOTE — Progress Notes (Signed)
Labs reviewed with MD today per orders.   [facility] presented for Portacath access and flush. Portacath located left chest wall accessed with  H 20 needle. Good blood return present. Portacath flushed with 95ml NS Procedure without incident. Patient tolerated procedure well.   Patient to go for stat CT scan.

## 2019-08-20 NOTE — Progress Notes (Signed)
Duane Alexander, [place] [address]   CLINIC:  Medical Oncology/Hematology  PCP:  Duane Hurl, PA-C [address] / [place] [address] [phone]   REASON FOR VISIT:  Follow-up for pancreatic cancer  PRIOR THERAPY: None  NGS Results: Not done  CURRENT THERAPY: FOLFIRINOX & [place]ox[place]  BRIEF ONCOLOGIC HISTORY:  Oncology History  Pancreatic cancer ([place])  03/28/2019 Initial Diagnosis   Pancreatic cancer ([place])   03/28/2019 Cancer Staging   Staging form: Exocrine Pancreas, AJCC 8th Edition - Clinical stage from 03/28/2019: Stage III (cT4, cN1, cM0) - Signed by Derek Jack, MD on 03/28/2019   04/20/2019 Genetic Testing   Negative genetic testing:  No pathogenic variants detected on the [facility] Multi-Cancer panel. The report date is 04/20/2019.  The Multi-Cancer Panel offered by [facility] includes sequencing and/or deletion duplication testing of the following 85 genes: AIP, ALK, APC, ATM, AXIN2,BAP1,  BARD1, BLM, BMPR1A, BRCA1, BRCA2, BRIP1, CASR, CDC73, CDH1, CDK4, CDKN1B, CDKN1C, CDKN2A (p14ARF), CDKN2A (p16INK4a), CEBPA, CHEK2, CTNNA1, DICER1, DIS3L2, EGFR (c.2369C>T, p.Thr790Met variant only), EPCAM (Deletion/duplication testing only), FH, FLCN, GATA2, GPC3, GREM1 (Promoter region deletion/duplication testing only), HOXB13 (c.251G>A, p.Gly84Glu), HRAS, KIT, MAX, MEN1, MET, MITF (c.952G>A, p.Glu318Lys variant only), MLH1, MSH2, MSH3, MSH6, MUTYH, NBN, NF1, NF2, NTHL1, PALB2, PDGFRA, PHOX2B, PMS2, POLD1, POLE, POT1, PRKAR1A, PTCH1, PTEN, RAD50, RAD51C, RAD51D, RB1, RECQL4, RET, RNF43, RUNX1, SDHAF2, SDHA (sequence changes only), SDHB, SDHC, SDHD, SMAD4, SMARCA4, SMARCB1, SMARCE1, STK11, SUFU, TERC, TERT, TMEM127, TP53, TSC1, TSC2, VHL, WRN and WT1.   04/23/2019 -  Chemotherapy   The patient had palonosetron (ALOXI) injection 0.25 mg, 0.25 mg, Intravenous,  Once, 7 of 8 cycles Administration: 0.25 mg (04/23/2019), 0.25 mg (05/07/2019), 0.25 mg  (05/21/2019), 0.25 mg (06/04/2019), 0.25 mg (06/18/2019), 0.25 mg (07/02/2019), 0.25 mg (07/23/2019) pegfilgrastim-cbqv (UDENYCA) injection 6 mg, 6 mg, Subcutaneous, Once, 7 of 8 cycles Administration: 6 mg (04/25/2019), 6 mg (05/09/2019), 6 mg (05/23/2019), 6 mg (06/06/2019), 6 mg (06/20/2019), 6 mg (07/04/2019), 6 mg (07/25/2019) irinotecan (CAMPTOSAR) 240 mg in sodium chloride 0.9 % 500 mL chemo infusion, 120 mg/m2 = 240 mg (80 % of original dose 150 mg/m2), Intravenous,  Once, 7 of 8 cycles Dose modification: 120 mg/m2 (80 % of original dose 150 mg/m2, Cycle 1, Reason: Other (see comments), Comment: cirrhosis) Administration: 240 mg (04/23/2019), 280 mg (05/07/2019), 280 mg (05/21/2019), 280 mg (06/04/2019), 280 mg (06/18/2019), 280 mg (07/02/2019), 280 mg (07/23/2019) oxaliplatin (ELOXATIN) 130 mg in dextrose 5 % 500 mL chemo infusion, 68 mg/m2 = 130 mg (80 % of original dose 85 mg/m2), Intravenous,  Once, 7 of 8 cycles Dose modification: 68 mg/m2 (80 % of original dose 85 mg/m2, Cycle 1, Reason: Other (see comments), Comment: cirrhosis), 68 mg/m2 (80 % of original dose 85 mg/m2, Cycle 2, Reason: Provider Judgment), 68 mg/m2 (80 % of original dose 85 mg/m2, Cycle 3, Reason: Other (see comments), Comment: neuropathy), 55 mg/m2 (original dose 85 mg/m2, Cycle 5, Reason: Other (see comments), Comment: worsening neuropathy) Administration: 130 mg (04/23/2019), 130 mg (05/07/2019), 130 mg (05/21/2019), 130 mg (06/04/2019), 100 mg (06/18/2019), 100 mg (07/02/2019), 100 mg (07/23/2019) fosaprepitant (EMEND) 150 mg in sodium chloride 0.9 % 145 mL IVPB, 150 mg, Intravenous,  Once, 7 of 8 cycles Administration: 150 mg (04/23/2019), 150 mg (05/07/2019), 150 mg (05/21/2019), 150 mg (06/04/2019), 150 mg (06/18/2019), 150 mg (07/02/2019), 150 mg (07/23/2019) fluorouracil (ADRUCIL) 3,700 mg in sodium chloride 0.9 % 76 mL chemo infusion, 1,920 mg/m2 = 3,700 mg (80 %  of original dose 2,400 mg/m2), Intravenous, 1 Day/Dose, 7 of 8 cycles Dose modification:  1,920 mg/m2 (80 % of original dose 2,400 mg/m2, Cycle 1, Reason: Other (see comments), Comment: cirrhosis) Administration: 3,700 mg (04/23/2019), 4,650 mg (05/07/2019), 4,650 mg (05/21/2019), 4,650 mg (06/04/2019), 4,650 mg (06/18/2019), 4,650 mg (07/02/2019), 4,650 mg (07/23/2019) leucovorin 618 mg in sodium chloride 0.9 % 250 mL infusion, 320 mg/m2 = 618 mg (80 % of original dose 400 mg/m2), Intravenous,  Once, 7 of 8 cycles Dose modification: 320 mg/m2 (80 % of original dose 400 mg/m2, Cycle 1, Reason: Other (see comments), Comment: cirrhosis) Administration: 618 mg (04/23/2019), 772 mg (05/07/2019), 772 mg (05/21/2019), 772 mg (06/04/2019), 772 mg (06/18/2019), 772 mg (07/02/2019), 772 mg (07/23/2019)  for chemotherapy treatment.      CANCER STAGING: Cancer Staging Pancreatic cancer [facility]) Staging form: Exocrine Pancreas, AJCC 8th Edition - Clinical stage from 03/28/2019: Stage III (cT4, cN1, cM0) - Signed by Derek Jack, MD on 03/28/2019   INTERVAL HISTORY:  Duane Alexander, a 64 y.o. male, returns for routine follow-up of his pancreatic cancer. Duane Alexander was last seen on 08/06/2019.  He went to see Dr. Barry Alexander and was found to have some abdominal distention and severe abdominal pain.  Dr. Barry Alexander has requested [place] to evaluate this patient for further labs and scans.  He reports worsening pain in the epigastric region for the last 1 week.  He has been taking oxycodone 2 tablets every 3 hours.  Pain relief is lasting only 2 hours.  He is not able to eat well because of the pain.  He lost 9 pounds in the last 2 weeks.   REVIEW OF SYSTEMS:  Review of Systems  Gastrointestinal: Positive for abdominal pain (epigastric area).    PAST MEDICAL/SURGICAL HISTORY:  Past Medical History:  Diagnosis Date  . Aortic atherosclerosis ([place])   . COPD (chronic obstructive pulmonary disease) ([place])    per CT findings 2019  . Family history of brain cancer   . Family history of colon cancer   . Family history of  kidney cancer   . Family history of leukemia   . Family history of lung cancer   . Family history of ovarian cancer   . Family history of prostate cancer   . Family history of stomach cancer   . GERD (gastroesophageal reflux disease)   . Hepatitis C    referred to hepatitis clinic 2019 but he never went  . Hyperlipidemia   . Hypertension   . Port-A-Cath in place 04/17/2019  . Smoker   . Wears glasses    Past Surgical History:  Procedure Laterality Date  . ANTERIOR CERVICAL DECOMP/DISCECTOMY FUSION  12/2017   Dr. Consuella Lose  . BIOPSY  03/11/2019   Procedure: BIOPSY;  Surgeon: [name]., MD;  Location: [facility];  Service: Gastroenterology;;  . COLONOSCOPY N/A 01/19/2017   Procedure: COLONOSCOPY;  Surgeon: Rogene Houston, MD;  [facility];  Service: Endoscopy;  Laterality: N/A;  930-moved to 9:45 per Lelon Frohlich  . ESOPHAGOGASTRODUODENOSCOPY (EGD) WITH PROPOFOL N/A 03/11/2019   Procedure: ESOPHAGOGASTRODUODENOSCOPY (EGD) WITH PROPOFOL;  Surgeon: Rush Landmark Telford Nab., MD;  Location: [facility]COPY;  Service: Gastroenterology;  Laterality: N/A;  . EUS N/A 03/11/2019   Procedure: UPPER ENDOSCOPIC ULTRASOUND (EUS) RADIAL;  Surgeon: Rush Landmark Telford Nab., MD;  Location: [place];  Service: Gastroenterology;  Laterality: N/A;  . FINE NEEDLE ASPIRATION  03/11/2019   Procedure: FINE NEEDLE ASPIRATION (FNA) LINEAR;  Surgeon: [name]., MD;  Location: [facility];  Service: Gastroenterology;;  . POLYPECTOMY  01/19/2017   Procedure: POLYPECTOMY;  Surgeon: Rogene Houston, MD;  [facility];  Service: Endoscopy;;  splenic flexure  . POLYPECTOMY  03/11/2019   Procedure: POLYPECTOMY;  Surgeon: Mansouraty, Telford Nab., MD;  Location: [place];  Service: Gastroenterology;;  . Sol Passer PLACEMENT Left 04/15/2019   Procedure: INSERTION PORT-A-CATH;  Surgeon: Aviva Signs, MD;  Location: [facility];  Service: General;  Laterality: Left;  . Right eye  surgery     as a child-removed muscle from leg and put in upper eye lid    SOCIAL HISTORY:  Social History   Socioeconomic History  . Marital status: Widowed    Spouse name: Not on file  . Number of children: Not on file  . Years of education: Not on file  . Highest education level: Not on file  Occupational History    Employer: [employer]  Tobacco Use  . Smoking status: Current Every Day Smoker    Packs/day: 0.50    Years: 45.00    Pack years: 22.50    Types: Cigarettes  . Smokeless tobacco: Never Used  Vaping Use  . Vaping Use: Never used  Substance and Sexual Activity  . Alcohol use: Not Currently  . Drug use: No  . Sexual activity: Not Currently  Other Topics Concern  . Not on file  Social History Narrative  . Not on file   Social Determinants of Health   Financial Resource Strain: Low Risk   . Difficulty of Paying Living Expenses: Not hard at all  Food Insecurity: No Food Insecurity  . Worried About Charity fundraiser in the Last Year: Never true  . Ran Out of Food in the Last Year: Never true  Transportation Needs: No Transportation Needs  . Lack of Transportation (Medical): No  . Lack of Transportation (Non-Medical): No  Physical Activity: Inactive  . Days of Exercise per Week: 0 days  . Minutes of Exercise per Session: 0 min  Stress: Stress Concern Present  . Feeling of Stress : Rather much  Social Connections: Socially Isolated  . Frequency of Communication with Friends and Family: Three times a week  . Frequency of Social Gatherings with Friends and Family: Three times a week  . Attends Religious Services: Never  . Active Member of Clubs or Organizations: No  . Attends Archivist Meetings: Never  . Marital Status: Widowed  Intimate Partner Violence: Not At Risk  . Fear of Current or Ex-Partner: No  . Emotionally Abused: No  . Physically Abused: No  . Sexually Abused: No    FAMILY HISTORY:  Family History  Problem Relation  Age of Onset  . Throat cancer Father 4       died in his 20s of stomach issues?  . Lung cancer Brother 65       smoker, non-small cell  . Ovarian cancer Sister 23       GI related death  . Heart attack Maternal Grandmother   . Brain cancer Maternal Aunt        dx. in her 74s  . Lung cancer Maternal Uncle        dx. in his 39s-60s, smoker  . Stomach cancer Maternal Aunt        dx. in her 37s  . Colon cancer Cousin        dx. >50 - maternal cousin  . Kidney cancer Cousin  dx. in his 35s - maternal cousin  . Prostate cancer Cousin        dx. in his 34s - maternal cousin  . Leukemia Nephew 34  . Heart disease Neg Hx   . Hypertension Neg Hx   . Stroke Neg Hx     CURRENT MEDICATIONS:  Current Outpatient Medications  Medication Sig Dispense Refill  . docusate sodium (COLACE) 100 MG capsule Take 100 mg by mouth daily.    . DULoxetine (CYMBALTA) 30 MG capsule TAKE 1 CAPSULE(30 MG) BY MOUTH DAILY 30 capsule 0  . fluorouracil CALGB 37106 in sodium chloride 0.9 % 150 mL Inject 2,400 mg/m2 into the vein over 48 hr.    . FLUOROURACIL IV Inject into the vein every 21 ( twenty-one) days.    . furosemide (LASIX) 20 MG tablet Take 1 tablet (20 mg total) by mouth daily as needed. 30 tablet 1  . IRINOTECAN HCL IV Inject into the vein every 14 (fourteen) days.    [name] LEUCOVORIN CALCIUM IV Inject into the vein every 14 (fourteen) days.    [name] lidocaine-prilocaine (EMLA) cream Apply a small amount to port a cath site and cover with plastic wrap one hour prior to chemotherapy appointments 30 g 3  . lisinopril (ZESTRIL) 20 MG tablet TAKE 1 TABLET BY MOUTH DAILY 90 tablet 0  . loperamide (IMODIUM A-D) 2 MG tablet Take 2 at onset of diarrhea, then 1 tab after every watery bowel movement - DO NOT EXCEED 8 tablets in a 24 hour period 100 tablet 1  . lovastatin (MEVACOR) 20 MG tablet TAKE 1 TABLET BY MOUTH AT BEDTIME 90 tablet 0  . melatonin 5 MG TABS Take 5 mg by mouth at bedtime.    . Multiple  Vitamin (MULTIVITAMIN) tablet Take 1 tablet by mouth daily.    . OXALIPLATIN IV Inject into the vein every 14 (fourteen) days.    [name] oxyCODONE (OXY IR/ROXICODONE) 5 MG immediate release tablet Take 2 tablets every 3 hours as needed for pain 224 tablet 0  . prochlorperazine (COMPAZINE) 10 MG tablet Take 1 tablet (10 mg total) by mouth every 6 (six) hours as needed (Nausea or vomiting). 60 tablet 3   No current facility-administered medications for this visit.    ALLERGIES:  No Known Allergies  PHYSICAL EXAM:  Performance status (ECOG): 1 - Symptomatic but completely ambulatory  There were no vitals filed for this visit. Wt Readings from Last 3 Encounters:  08/06/19 154 lb 11.2 oz (70.2 kg)  07/23/19 165 lb 2 oz (74.9 kg)  07/16/19 157 lb 11.2 oz (71.5 kg)   Physical Exam Vitals reviewed.  Constitutional:      Appearance: Normal appearance.  Cardiovascular:     Rate and Rhythm: Normal rate and regular rhythm.     Heart sounds: Normal heart sounds.  Pulmonary:     Effort: Pulmonary effort is normal.     Breath sounds: Normal breath sounds.  Abdominal:     Palpations: There is no mass.     Tenderness: There is abdominal tenderness in the right upper quadrant, epigastric area and left upper quadrant.  Neurological:     Mental Status: He is alert and oriented to person, place, and time.  Psychiatric:        Mood and Affect: Mood normal.        Behavior: Behavior normal.      LABORATORY DATA:  I have reviewed the labs as listed.  CBC Latest Ref Rng & Units  08/20/2019 08/06/2019 07/23/2019  WBC 4.0 - 10.5 K/uL 8.4 11.6(H) 8.6  Hemoglobin 13.0 - 17.0 g/dL 11.8(L) 10.6(L) 10.0(L)  Hematocrit 39 - 52 % 35.4(L) 32.4(L) 30.9(L)  Platelets 150 - 400 K/uL 224 164 202   CMP Latest Ref Rng & Units 08/20/2019 08/06/2019 07/23/2019  Glucose 70 - 99 mg/dL 139(H) 104(H) 106(H)  BUN 8 - 23 mg/dL _0 Creatinine 0.61 - 1.24 mg/dL 0.47(L) 0.67 0.70  Sodium 135 - 145 mmol/L 135 131(L) 138    Potassium 3.5 - 5.1 mmol/L 4.0 4.0 4.2  Chloride 98 - 111 mmol/L 101 101 106  CO2 22 - 32 mmol/L _1 Calcium 8.9 - 10.3 mg/dL 8.8(L) 8.4(L) 8.7(L)  Total Protein 6.5 - 8.1 g/dL 7.6 7.2 6.4(L)  Total Bilirubin 0.3 - 1.2 mg/dL 6.8(H) 0.6 0.7  Alkaline Phos 38 - 126 U/L 155(H) 99 100  AST 15 - 41 U/L 66(H) 42(H) 37  ALT 0 - 44 U/L 43 31 27    DIAGNOSTIC IMAGING:  I have independently reviewed the scans and discussed with the patient. CT Abdomen Pelvis W Wo Contrast  Result Date: 08/20/2019 CLINICAL DATA:  Follow-up pancreatic cancer. EXAM: CT ABDOMEN AND PELVIS WITHOUT AND WITH CONTRAST TECHNIQUE: Multidetector CT imaging of the abdomen and pelvis was performed following the standard protocol before and following the bolus administration of intravenous contrast. CONTRAST:  136m OMNIPAQUE IOHEXOL 300 MG/ML  SOLN COMPARISON:  CT scan 07/11/2019 and PET-CT 03/27/2019 FINDINGS: Lower chest: Small left pleural effusion is noted. The heart is normal in size. No pericardial effusion. There are new small pulmonary nodules worrisome for new metastatic disease. The largest nodule measures 9 mm at the left lung base just above the hemidiaphragm. On the coronal images this has a somewhat triangular appearance and could be a focus of subpleural atelectasis. Other sub 4 mm pulmonary nodules and both lower lobes are indeterminate but somewhat worrisome. Recommend short-term follow-up noncontrast chest CT in 3 months. Hepatobiliary: Stable cirrhotic changes involving the liver. No worrisome hepatic lesions to suggest metastatic disease. Progressive intrahepatic biliary dilatation is noted along with progressive common bile duct dilatation. The gallbladder is also now distended. Findings suspicious for compression of the common bile duct in the head of the pancreas. Patient may need common bile duct stent. Recommend correlation with liver function studies. Pancreas: Infiltrating low-attenuation lesion in the  pancreatic head is again demonstrated. This measures approximately 3.1 x 2.8 x 1.7 cm. It previously measured 3.6 x 2.2 x 2.1 cm. Slightly progressive dilatation of the main pancreatic duct and progressive atrophy of the pancreatic tail. Spleen: Normal size.  No focal lesions. Adrenals/Urinary Tract: The adrenal glands and kidneys are unremarkable. Stomach/Bowel: The stomach, duodenum, small bowel and colon are grossly normal. Vascular/Lymphatic: Stable fairly extensive peripancreatic, periportal and retroperitoneal adenopathy. The aorta and branch vessels are patent. The portal vein is patent. The confluence of the SMV and splenic vein is irregular and somewhat compressed. No complete obstruction or thrombosis. Extensive portal venous collaterals noted. Reproductive: The prostate gland and seminal vesicles are unremarkable. Other: Moderate amount of free abdominal/pelvic fluid, new since prior study Musculoskeletal: No significant bony findings. IMPRESSION: 1. Slight interval decrease in size of the pancreatic head mass and stable fairly extensive peripancreatic, periportal and retroperitoneal adenopathy. 2. Progressive intrahepatic and common bile duct dilatation and gallbladder distension with findings suspicious for compression of the common bile duct in the head of the pancreas. Patient may need common bile duct stent. Recommend  correlation with liver function studies. 3. Stable cirrhotic changes involving the liver but no findings for hepatic metastatic disease. 4. New small left pleural effusion and new moderate amount of free abdominal/pelvic fluid. 5. New small pulmonary nodules worrisome for new metastatic disease. Recommend short-term follow-up noncontrast chest CT in 3 months. 6. Aortic atherosclerosis. These results will be called to the ordering clinician or representative by the Radiologist Assistant, and communication documented in the PACS or [employer]. Aortic Atherosclerosis (ICD10-I70.0).  Electronically Signed   By: Marijo Sanes M.D.   On: 08/20/2019 15:31     ASSESSMENT:  1. Pancreatic adenocarcinoma (UT 4 N1 MX): -PET scan on 03/28/2019 did not show any evidence of metastatic disease. -CA 19-9 was 40 on 02/23/2019. -Germline mutation testing was negative. -6 cycles of FOLFIRINOX from 04/23/2019 through 07/02/2019. -CT CAP on 07/11/2019 showed enlarging mass in the pancreatic body measuring 2.1 x 2.2 x 3.6 cm. The mass narrows and directly abuts the portal vein at its junction with the splenic vein and SMV and also narrows the SMV. Mild peripancreatic adenopathy more or less stable. No new lesions.  2. Epigastric pain: -This is fairly improved since the start of treatments. -He is using hydrocodone 5/325 as needed. Some days he does not require any pain medication.  3. Liver disease: -CT scan showed mild degree of cirrhosis. History of hepatitis C which was untreated.   PLAN:  1. Pancreatic adenocarcinoma: -I reviewed his labs today.  Bilirubin increased to 6.8 with direct bilirubin of 4.6.  AST has gone up to 66 from 42 previously. -We have done a stat CT of the abdomen which showed slight decrease in size of the pancreatic head mass and stable peripancreatic, periportal and retroperitoneal adenopathy.  There is dilatation of the intrahepatic and common bile duct and distention of the gallbladder suspicious for compression of the CBD in the head of the pancreas.  New small left pleural effusion and a moderate amount of free abdominal/pelvic fluid.  Portal vein is patent.  No thrombosis and SMV or splenic vein.  New small pulmonary nodules seen at the bases. -We have reached out to Dr. Laural Golden.  He has reviewed scans and kindly agreed to do ERCP and stent placement tomorrow. -Patient was instructed to be n.p.o. -I plan to order CT of the chest with contrast to see if there is any evidence of metastatic disease.  I will see him back after the scans. -I will also update  Dr. Barry Alexander of the new developments.  2. Tingling/numbness: -He has some numbness in the fingertips and toes from spine problem.  He stopped taking gabapentin as he could not tolerate.  3. Epigastric pain: -This has worsened in the last few days.  He is taking oxycodone 10 mg every 3 hours but pain comes back in 2 hours. -He received Dilaudid 2 mg IM in our office which seemed to have helped.  I will send a prescription for Dilaudid 2 mg every 3 hours as needed.   Orders placed this encounter:  Orders Placed This Encounter  Procedures  . CT Chest W Contrast     Derek Jack, MD [place] [phone]   I, Milinda Antis, am acting as a scribe for Dr. Sanda Linger.  I, Derek Jack MD, have reviewed the above documentation for accuracy and completeness, and I agree with the above.

## 2019-08-20 NOTE — Progress Notes (Signed)
Duane Alexander tolerated port lab draw with flush well without complaints or incident. Port accessed for lab draw then pt sent to Radiology for CT scan and pt returned and port flushed easily per potocol and de-accessed. Pt discharged self ambulatory in stable condition accompanied by his sister

## 2019-08-21 ENCOUNTER — Encounter (HOSPITAL_COMMUNITY)
Admission: RE | Disposition: A | Discharge status: Home / Self Care | Payer: Self-pay | Source: Ambulatory Visit | Attending: Internal Medicine

## 2019-08-21 ENCOUNTER — Ambulatory Visit (HOSPITAL_COMMUNITY): Payer: BC Managed Care – PPO

## 2019-08-21 ENCOUNTER — Other Ambulatory Visit (INDEPENDENT_AMBULATORY_CARE_PROVIDER_SITE_OTHER): Payer: Self-pay | Admitting: *Deleted

## 2019-08-21 ENCOUNTER — Ambulatory Visit (HOSPITAL_COMMUNITY): Payer: BC Managed Care – PPO | Admitting: Anesthesiology

## 2019-08-21 ENCOUNTER — Ambulatory Visit (HOSPITAL_COMMUNITY)
Admission: RE | Admit: 2019-08-21 | Discharge: 2019-08-21 | Disposition: A | Discharge status: Home / Self Care | Payer: BC Managed Care – PPO | Source: Ambulatory Visit | Attending: Internal Medicine | Admitting: Internal Medicine

## 2019-08-21 ENCOUNTER — Encounter (HOSPITAL_COMMUNITY): Payer: Self-pay | Admitting: Internal Medicine

## 2019-08-21 DIAGNOSIS — J449 Chronic obstructive pulmonary disease, unspecified: Secondary | ICD-10-CM | POA: Diagnosis not present

## 2019-08-21 DIAGNOSIS — Z801 Family history of malignant neoplasm of trachea, bronchus and lung: Secondary | ICD-10-CM | POA: Insufficient documentation

## 2019-08-21 DIAGNOSIS — I10 Essential (primary) hypertension: Secondary | ICD-10-CM | POA: Insufficient documentation

## 2019-08-21 DIAGNOSIS — Z79899 Other long term (current) drug therapy: Secondary | ICD-10-CM | POA: Insufficient documentation

## 2019-08-21 DIAGNOSIS — R17 Unspecified jaundice: Secondary | ICD-10-CM

## 2019-08-21 DIAGNOSIS — Z20822 Contact with and (suspected) exposure to covid-19: Secondary | ICD-10-CM | POA: Diagnosis not present

## 2019-08-21 DIAGNOSIS — J90 Pleural effusion, not elsewhere classified: Secondary | ICD-10-CM | POA: Insufficient documentation

## 2019-08-21 DIAGNOSIS — K2100 Gastro-esophageal reflux disease with esophagitis, without bleeding: Secondary | ICD-10-CM | POA: Insufficient documentation

## 2019-08-21 DIAGNOSIS — R188 Other ascites: Secondary | ICD-10-CM | POA: Insufficient documentation

## 2019-08-21 DIAGNOSIS — C259 Malignant neoplasm of pancreas, unspecified: Secondary | ICD-10-CM

## 2019-08-21 DIAGNOSIS — M1990 Unspecified osteoarthritis, unspecified site: Secondary | ICD-10-CM | POA: Diagnosis not present

## 2019-08-21 DIAGNOSIS — K269 Duodenal ulcer, unspecified as acute or chronic, without hemorrhage or perforation: Secondary | ICD-10-CM | POA: Diagnosis not present

## 2019-08-21 DIAGNOSIS — C250 Malignant neoplasm of head of pancreas: Secondary | ICD-10-CM | POA: Diagnosis not present

## 2019-08-21 DIAGNOSIS — F17210 Nicotine dependence, cigarettes, uncomplicated: Secondary | ICD-10-CM | POA: Diagnosis not present

## 2019-08-21 DIAGNOSIS — K831 Obstruction of bile duct: Secondary | ICD-10-CM | POA: Diagnosis not present

## 2019-08-21 DIAGNOSIS — Z8719 Personal history of other diseases of the digestive system: Secondary | ICD-10-CM | POA: Diagnosis not present

## 2019-08-21 HISTORY — PX: SPHINCTEROTOMY: SHX5544

## 2019-08-21 HISTORY — PX: ERCP: SHX5425

## 2019-08-21 HISTORY — PX: BILIARY STENT PLACEMENT: SHX5538

## 2019-08-21 LAB — SARS CORONAVIRUS 2 BY RT PCR (HOSPITAL ORDER, PERFORMED IN ~~LOC~~ HOSPITAL LAB): SARS Coronavirus 2: NEGATIVE

## 2019-08-21 SURGERY — ERCP, WITH INTERVENTION IF INDICATED
Anesthesia: General

## 2019-08-21 MED ORDER — FENTANYL CITRATE (PF) 100 MCG/2ML IJ SOLN
INTRAMUSCULAR | Status: DC | PRN
  Administered 2019-08-21 (×2): 50 ug via INTRAVENOUS

## 2019-08-21 MED ORDER — LACTATED RINGERS IV SOLN
Freq: Once | INTRAVENOUS | Status: AC
  Administered 2019-08-21: 1000 mL via INTRAVENOUS

## 2019-08-21 MED ORDER — CEFAZOLIN SODIUM-DEXTROSE 2-4 GM/100ML-% IV SOLN
INTRAVENOUS | Status: AC
  Filled 2019-08-21: qty 100

## 2019-08-21 MED ORDER — ROCURONIUM BROMIDE 100 MG/10ML IV SOLN
INTRAVENOUS | Status: DC | PRN
  Administered 2019-08-21: 30 mg via INTRAVENOUS

## 2019-08-21 MED ORDER — DEXAMETHASONE SODIUM PHOSPHATE 10 MG/ML IJ SOLN
INTRAMUSCULAR | Status: AC
  Filled 2019-08-21: qty 1

## 2019-08-21 MED ORDER — GLUCAGON HCL RDNA (DIAGNOSTIC) 1 MG IJ SOLR
INTRAMUSCULAR | Status: AC
  Filled 2019-08-21: qty 1

## 2019-08-21 MED ORDER — SODIUM CHLORIDE 0.9% FLUSH: 10.0000 mL | INTRAVENOUS | Status: DC | PRN

## 2019-08-21 MED ORDER — DEXAMETHASONE SODIUM PHOSPHATE 4 MG/ML IJ SOLN
INTRAMUSCULAR | Status: DC | PRN
  Administered 2019-08-21: 10 mg via INTRAVENOUS

## 2019-08-21 MED ORDER — LACTATED RINGERS IV SOLN: INTRAVENOUS | Status: DC | PRN

## 2019-08-21 MED ORDER — SODIUM CHLORIDE 0.9 % IV SOLN
INTRAVENOUS | Status: DC | PRN
  Administered 2019-08-21: 20 mL

## 2019-08-21 MED ORDER — SODIUM CHLORIDE FLUSH 0.9 % IV SOLN
INTRAVENOUS | Status: AC
  Filled 2019-08-21: qty 10

## 2019-08-21 MED ORDER — SUGAMMADEX SODIUM 200 MG/2ML IV SOLN
INTRAVENOUS | Status: DC | PRN
  Administered 2019-08-21: 200 mg via INTRAVENOUS

## 2019-08-21 MED ORDER — ONDANSETRON HCL 4 MG/2ML IJ SOLN
INTRAMUSCULAR | Status: DC | PRN
  Administered 2019-08-21: 4 mg via INTRAVENOUS

## 2019-08-21 MED ORDER — HEPARIN SOD (PORK) LOCK FLUSH 100 UNIT/ML IV SOLN
500.0000 [IU] | INTRAVENOUS | Status: AC | PRN
  Administered 2019-08-21: 500 [IU]
  Filled 2019-08-21: qty 5

## 2019-08-21 MED ORDER — LIDOCAINE HCL (CARDIAC) PF 100 MG/5ML IV SOSY
PREFILLED_SYRINGE | INTRAVENOUS | Status: DC | PRN
  Administered 2019-08-21: 50 mg via INTRAVENOUS

## 2019-08-21 MED ORDER — LIDOCAINE 2% (20 MG/ML) 5 ML SYRINGE
INTRAMUSCULAR | Status: AC
  Filled 2019-08-21: qty 5

## 2019-08-21 MED ORDER — WATER FOR IRRIGATION, STERILE IR SOLN
Status: DC | PRN
  Administered 2019-08-21: 1000 mL

## 2019-08-21 MED ORDER — CHLORHEXIDINE GLUCONATE CLOTH 2 % EX PADS: 6.0000 | MEDICATED_PAD | Freq: Once | CUTANEOUS | Status: DC

## 2019-08-21 MED ORDER — PROPOFOL 10 MG/ML IV BOLUS
INTRAVENOUS | Status: DC | PRN
  Administered 2019-08-21: 20 mg via INTRAVENOUS
  Administered 2019-08-21: 120 mg via INTRAVENOUS
  Administered 2019-08-21 (×2): 30 mg via INTRAVENOUS

## 2019-08-21 MED ORDER — MEPERIDINE HCL 50 MG/ML IJ SOLN: 6.2500 mg | INTRAMUSCULAR | Status: DC | PRN

## 2019-08-21 MED ORDER — PANTOPRAZOLE SODIUM 40 MG PO TBEC: 40.0000 mg | DELAYED_RELEASE_TABLET | Freq: Every day | ORAL | 5 refills | Status: AC

## 2019-08-21 MED ORDER — PANTOPRAZOLE SODIUM 40 MG IV SOLR
40.0000 mg | Freq: Once | INTRAVENOUS | Status: AC
  Administered 2019-08-21: 40 mg via INTRAVENOUS
  Filled 2019-08-21: qty 40

## 2019-08-21 MED ORDER — ONDANSETRON HCL 4 MG/2ML IJ SOLN
INTRAMUSCULAR | Status: AC
  Filled 2019-08-21: qty 2

## 2019-08-21 MED ORDER — GLYCOPYRROLATE PF 0.2 MG/ML IJ SOSY
PREFILLED_SYRINGE | INTRAMUSCULAR | Status: AC
  Filled 2019-08-21: qty 1

## 2019-08-21 MED ORDER — ROCURONIUM BROMIDE 10 MG/ML (PF) SYRINGE
PREFILLED_SYRINGE | INTRAVENOUS | Status: AC
  Filled 2019-08-21: qty 10

## 2019-08-21 MED ORDER — PROPOFOL 10 MG/ML IV BOLUS
INTRAVENOUS | Status: AC
  Filled 2019-08-21: qty 40

## 2019-08-21 MED ORDER — SUCCINYLCHOLINE CHLORIDE 200 MG/10ML IV SOSY
PREFILLED_SYRINGE | INTRAVENOUS | Status: AC
  Filled 2019-08-21: qty 10

## 2019-08-21 MED ORDER — SODIUM CHLORIDE 0.9 % IV SOLN
INTRAVENOUS | Status: AC
  Filled 2019-08-21: qty 50

## 2019-08-21 MED ORDER — CEFAZOLIN SODIUM-DEXTROSE 2-4 GM/100ML-% IV SOLN
2.0000 g | INTRAVENOUS | Status: AC
  Administered 2019-08-21: 2 g via INTRAVENOUS

## 2019-08-21 MED ORDER — MIDAZOLAM HCL 2 MG/2ML IJ SOLN
INTRAMUSCULAR | Status: DC | PRN
  Administered 2019-08-21: 1 mg via INTRAVENOUS

## 2019-08-21 MED ORDER — MIDAZOLAM HCL 2 MG/2ML IJ SOLN
INTRAMUSCULAR | Status: AC
  Filled 2019-08-21: qty 2

## 2019-08-21 MED ORDER — FENTANYL CITRATE (PF) 250 MCG/5ML IJ SOLN
INTRAMUSCULAR | Status: AC
  Filled 2019-08-21: qty 5

## 2019-08-21 MED ORDER — ONDANSETRON HCL 4 MG/2ML IJ SOLN: 4.0000 mg | Freq: Once | INTRAMUSCULAR | Status: DC | PRN

## 2019-08-21 MED ORDER — HYDROMORPHONE HCL 1 MG/ML IJ SOLN
0.2500 mg | INTRAMUSCULAR | Status: DC | PRN
  Administered 2019-08-21: 0.5 mg via INTRAVENOUS
  Filled 2019-08-21: qty 0.5

## 2019-08-21 MED ORDER — LIDOCAINE VISCOUS HCL 2 % MT SOLN
15.0000 mL | Freq: Once | OROMUCOSAL | Status: AC
  Administered 2019-08-21: 15 mL via OROMUCOSAL

## 2019-08-21 MED ORDER — LIDOCAINE VISCOUS HCL 2 % MT SOLN
OROMUCOSAL | Status: AC
  Filled 2019-08-21: qty 15

## 2019-08-21 MED ORDER — SUCCINYLCHOLINE CHLORIDE 200 MG/10ML IV SOSY
PREFILLED_SYRINGE | INTRAVENOUS | Status: DC | PRN
  Administered 2019-08-21: 140 mg via INTRAVENOUS

## 2019-08-21 MED ORDER — GLUCAGON HCL RDNA (DIAGNOSTIC) 1 MG IJ SOLR
INTRAMUSCULAR | Status: DC | PRN
  Administered 2019-08-21 (×2): .25 mg via INTRAVENOUS

## 2019-08-21 NOTE — Op Note (Signed)
[facility] Patient Name: Duane Alexander Procedure Date: 08/21/2019 11:45 AM MRN: [id number] Date of Birth: Nov 30, 1955 Attending MD: Hildred Laser , MD CSN: [id number] Age: 64 Admit Type: Outpatient Procedure:                ERCP follwed by EGD Indications:              Jaundice, Malignant tumor of the head of pancreas Providers:                Hildred Laser, MD, Janeece Riggers, RN, Lambert Mody, Randa Spike, Technician Referring MD:             Derek Jack, MD and Stark Klein, MD Medicines:                General Anesthesia Complications:            No immediate complications. Estimated Blood Loss:     Estimated blood loss: none. Procedure:                Pre-Anesthesia Assessment:                           - Prior to the procedure, a History and Physical                            was performed, and patient medications and                            allergies were reviewed. The patient's tolerance of                            previous anesthesia was also reviewed. The risks                            and benefits of the procedure and the sedation                            options and risks were discussed with the patient.                            All questions were answered, and informed consent                            was obtained. Prior Anticoagulants: The patient has                            taken no previous anticoagulant or antiplatelet                            agents. ASA Grade Assessment: III - A patient with                            severe systemic disease. After reviewing the risks  and benefits, the patient was deemed in                            satisfactory condition to undergo the procedure.                           After obtaining informed consent, the scope was                            passed under direct vision. Throughout the                            procedure, the patient's  blood pressure, pulse, and                            oxygen saturations were monitored continuously. The                            [id number]-Q180V ([id number]) scope was introduced through                            the mouth, and used to inject contrast into and                            used to inject contrast into the bile duct. The                            ERCP was technically difficult and complex due to a                            partially obstructing mass. The patient tolerated                            the procedure well. Scope In: 1:49:56 PM Scope Out: 2:31:34 PM Total Procedure Duration: 0 hours 41 minutes 38 seconds  Findings:      The scout film was normal. The esophagus was successfully intubated       under direct vision. The scope was advanced to a normal major papilla in       the descending duodenum without detailed examination of the pharynx,       larynx and associated structures, and upper GI tract. The upper GI tract       was grossly normal. The bile duct was deeply cannulated with the       Hydratome sphincterotome. Contrast was injected. I personally       interpreted the bile duct and pancreatic duct images. There was brisk       flow of contrast through the ducts. Image quality was excellent.       Contrast extended to the main bile duct. Contrast extended to the       bifurcation. Contrast extended to the hepatic ducts. The common bile       duct was completely obstructed by what appeared to be a mass. The common       bile duct and common hepatic duct were diffusely dilated, with a mass  causing an obstruction. A 7 mm biliary sphincterotomy was made with a       braided Hydratome sphincterotome using ERBE electrocautery. There was no       post-sphincterotomy bleeding. One 10 mm by 6 cm covered metal stent was       placed 5 cm into the common bile duct. Fluid and bile flowed through the       stent. The stent was in good position.      Once ERCP was  completed Q-scope was passed to examine UGI tract with       following findings.      grade esophagitis.nomal gastric mucosa. Large post bulbar ulcer at angle       of duodenum along with multiple erosions involving 2nd part of duodenum.      Biopsies taken from ulcer mrging. Impression:               - A biliary tract obstruction secondary to what                            appeared to be a mass was found in the common bile                            duct.                           - The common bile duct and common hepatic duct were                            dilated, with a mass causing an obstruction.                           - A biliary sphincterotomy was performed.                           - One covered metal stent was placed into the                            common bile duct.                           - Erosive reflux esophagitis.                           - Duodenal Ulcer at angle of duodenum. Biopsied.                           - Multiple erosions involving 2nd part of duodenum.                            ? secondary to chemotherapy. Moderate Sedation:      Per Anesthesia Care Recommendation:           - Avoid aspirin and nonsteroidal anti-inflammatory                            medicines.                           -  Discharge patient to home (with escort).                           - Clear liquid diet today.                           - Cardiac diet.                           - Pantoprazole 40 mg IV now then 40 mg po qam..                           - Await path results.                           - Followup with Dr. Delton Coombes next week. Procedure Code(s):        --- Professional ---                           [phone], Endoscopic retrograde                            cholangiopancreatography (ERCP); with placement of                            endoscopic stent into biliary or pancreatic duct,                            including pre- and post-dilation and guide wire                             passage, when performed, including sphincterotomy,                            when performed, each stent Diagnosis Code(s):        --- Professional ---                           K83.1, Obstruction of bile duct                           R17, Unspecified jaundice                           C25.0, Malignant neoplasm of head of pancreas CPT copyright 2019 [employer]. All rights reserved. The codes documented in this report are preliminary and upon coder review may  be revised to meet current compliance requirements. Hildred Laser, MD Hildred Laser, MD 08/21/2019 4:23:13 PM This report has been signed electronically. Number of Addenda: 0

## 2019-08-21 NOTE — Discharge Instructions (Signed)
Endoscopic Retrograde Cholangiopancreatogram, Care After This sheet gives you information about how to care for yourself after your procedure. Your health care provider may also give you more specific instructions. If you have problems or questions, contact your health care provider. What can I expect after the procedure? After the procedure, it is common to have:  Soreness in your throat.  Nausea.  Bloating.  Dizziness.  Tiredness (fatigue). Follow these instructions at home:   Take over-the-counter and prescription medicines only as told by your health care provider.  Do not drive for 24 hours if you were given a medicine to help you relax (sedative) during your procedure. Have someone stay with you for 24 hours after the procedure.  Return to your normal activities as told by your health care provider. Ask your health care provider what activities are safe for you.  Return to eating what you normally do as soon as you feel well enough or as told by your health care provider.  Keep all follow-up visits as told by your health care provider. This is important. Contact a health care provider if:  You have pain in your abdomen that does not get better with medicine.  You develop signs of infection, such as: ? Chills. ? Feeling unwell. Get help right away if:  You have difficulty swallowing.  You have worsening pain in your throat, chest, or abdomen.  You vomit bright red blood or a substance that looks like coffee grounds.  You have bloody or very black stools.  You have a fever.  You have a sudden increase in swelling (bloating) in your abdomen. Summary  After the procedure, it is common to feel tired and to have some discomfort in your throat.  Contact your health care provider if you have signs of infection--such as chills or feeling unwell--or if you have pain that does not improve with medicine.  Get help right away if you have trouble swallowing, worsening  pain, bloody or black vomit, bloody or black stools, a fever, or increased swelling in your abdomen.  Keep all follow-up visits as told by your health care provider. This is important. This information is not intended to replace advice given to you by your health care provider. Make sure you discuss any questions you have with your health care provider. Document Revised: 12/02/2016 Document Reviewed: 11/09/2015 Elsevier Patient Education  2020 [place] Anesthesia, Adult, Care After This sheet gives you information about how to care for yourself after your procedure. Your health care provider may also give you more specific instructions. If you have problems or questions, contact your health care provider. What can I expect after the procedure? After the procedure, the following side effects are common:  Pain or discomfort at the IV site.  Nausea.  Vomiting.  Sore throat.  Trouble concentrating.  Feeling cold or chills.  Weak or tired.  Sleepiness and fatigue.  Soreness and body aches. These side effects can affect parts of the body that were not involved in surgery. Follow these instructions at home:  For at least 24 hours after the procedure:  Have a responsible adult stay with you. It is important to have someone help care for you until you are awake and alert.  Rest as needed.  Do not: ? Participate in activities in which you could fall or become injured. ? Drive. ? Use heavy machinery. ? Drink alcohol. ? Take sleeping pills  or medicines that cause drowsiness. ? Make important decisions or sign legal documents. ? Take care of children on your own. Eating and drinking  Follow any instructions from your health care provider about eating or drinking restrictions.  When you feel hungry, start by eating small amounts of foods that are soft and easy to digest (bland), such as toast. Gradually return to your regular diet.  Drink  enough fluid to keep your urine pale yellow.  If you vomit, rehydrate by drinking water, juice, or clear broth. General instructions  If you have sleep apnea, surgery and certain medicines can increase your risk for breathing problems. Follow instructions from your health care provider about wearing your sleep device: ? Anytime you are sleeping, including during daytime naps. ? While taking prescription pain medicines, sleeping medicines, or medicines that make you drowsy.  Return to your normal activities as told by your health care provider. Ask your health care provider what activities are safe for you.  Take over-the-counter and prescription medicines only as told by your health care provider.  If you smoke, do not smoke without supervision.  Keep all follow-up visits as told by your health care provider. This is important. Contact a health care provider if:  You have nausea or vomiting that does not get better with medicine.  You cannot eat or drink without vomiting.  You have pain that does not get better with medicine.  You are unable to pass urine.  You develop a skin rash.  You have a fever.  You have redness around your IV site that gets worse. Get help right away if:  You have difficulty breathing.  You have chest pain.  You have blood in your urine or stool, or you vomit blood. Summary  After the procedure, it is common to have a sore throat or nausea. It is also common to feel tired.  Have a responsible adult stay with you for the first 24 hours after general anesthesia. It is important to have someone help care for you until you are awake and alert.  When you feel hungry, start by eating small amounts of foods that are soft and easy to digest (bland), such as toast. Gradually return to your regular diet.  Drink enough fluid to keep your urine pale yellow.  Return to your normal activities as told by your health care provider. Ask your health care provider  what activities are safe for you. This information is not intended to replace advice given to you by your health care provider. Make sure you discuss any questions you have with your health care provider. Document Revised: 12/23/2016 Document Reviewed: 08/05/2016 Elsevier Patient Education  [place].

## 2019-08-21 NOTE — Anesthesia Postprocedure Evaluation (Signed)
Anesthesia Post Note  Patient: Duane Alexander  Procedure(s) Performed: ENDOSCOPIC RETROGRADE CHOLANGIOPANCREATOGRAPHY (ERCP) (N/A ) BILIARY STENT PLACEMENT (N/A ) SPHINCTEROTOMY (N/A )  Patient location during evaluation: PACU Anesthesia Type: General Level of consciousness: awake and alert Pain management: pain level controlled Vital Signs Assessment: post-procedure vital signs reviewed and stable Respiratory status: spontaneous breathing Cardiovascular status: stable Postop Assessment: no apparent nausea or vomiting Anesthetic complications: no   No complications documented.   Last Vitals:  Vitals:   08/21/19 1456 08/21/19 1500  BP: 129/81 135/89  Pulse: 73 70  Resp: 20 16  Temp: 36.6 C   SpO2: 100% 100%    Last Pain:  Vitals:   08/21/19 1214  TempSrc: Oral  PainSc: 9                  Aerica Rincon

## 2019-08-21 NOTE — Transfer of Care (Signed)
Immediate Anesthesia Transfer of Care Note  Patient: Duane Alexander  Procedure(s) Performed: ENDOSCOPIC RETROGRADE CHOLANGIOPANCREATOGRAPHY (ERCP) (N/A ) BILIARY STENT PLACEMENT (N/A ) SPHINCTEROTOMY (N/A )  Patient Location: PACU  Anesthesia Type:General  Level of Consciousness: awake, alert , oriented and patient cooperative  Airway & Oxygen Therapy: Patient Spontanous Breathing and Patient connected to nasal cannula oxygen  Post-op Assessment: Report given to RN and Post -op Vital signs reviewed and stable  Post vital signs: Reviewed and stable  Last Vitals:  Vitals Value Taken Time  BP 135/89 08/21/19 1500  Temp 36.6 C 08/21/19 1456  Pulse 71 08/21/19 1500  Resp 10 08/21/19 1500  SpO2 100 % 08/21/19 1500  Vitals shown include unvalidated device data.  Last Pain:  Vitals:   08/21/19 1214  TempSrc: Oral  PainSc: 9       Patients Stated Pain Goal: 5 (79/39/[id number])  Complications: No complications documented.

## 2019-08-21 NOTE — Progress Notes (Signed)
ERCP note.  Normal ampulla water. Technically difficult study advance wire past the biliary obstruction. CBD obstruction secondary to known pancreatic carcinoma. Biliary sphincterotomy performed. Fully covered 10 mm x 60 mm [employer] walls stent placed. Reference number [id number] Lot #[id number]  EGD findings.  Grade a reflux esophagitis. Duodenal ulcer at angle of duodenum.  Biopsies taken. Multiple erosions noted involving second part of the duodenum.

## 2019-08-21 NOTE — Anesthesia Procedure Notes (Signed)
Procedure Name: Intubation Date/Time: 08/21/2019 1:38 PM Performed by: Georgeanne Nim, CRNA Pre-anesthesia Checklist: Patient identified, Emergency Drugs available, Suction available, Patient being monitored and Timeout performed Patient Re-evaluated:Patient Re-evaluated prior to induction Oxygen Delivery Method: Circle system utilized Preoxygenation: Pre-oxygenation with 100% oxygen Induction Type: IV induction Ventilation: Mask ventilation without difficulty Grade View: Grade I Tube type: Oral Tube size: 7.5 mm Number of attempts: 1 Airway Equipment and Method: Stylet Placement Confirmation: ETT inserted through vocal cords under direct vision,  positive ETCO2,  CO2 detector and breath sounds checked- equal and bilateral Secured at: 23 cm Tube secured with: Tape Dental Injury: Teeth and Oropharynx as per pre-operative assessment

## 2019-08-21 NOTE — Anesthesia Preprocedure Evaluation (Signed)
Anesthesia Evaluation  Patient identified by MRN, date of birth, ID band Patient awake  General Assessment Comment:US guided PIVs every time  Reviewed: Allergy & Precautions, NPO status , Patient's Chart, lab work & pertinent test results  History of Anesthesia Complications (+) DIFFICULT IV STICK / SPECIAL LINE and history of anesthetic complications  Airway Mallampati: I  TM Distance: >3 FB Neck ROM: Full   Comment: ACDF Dental no notable dental hx. (+) Dental Advisory Given, Upper Dentures, Edentulous Lower   Pulmonary COPD,  COPD inhaler, Current SmokerPatient did not abstain from smoking.,  23 pack year history, about 1/2ppd currently   Pulmonary exam normal breath sounds clear to auscultation       Cardiovascular hypertension, Pt. on medications + CAD  Normal cardiovascular exam Rhythm:Regular Rate:Normal  HLD     Neuro/Psych  Neuromuscular disease (Right leg weakness) negative psych ROS   GI/Hepatic GERD  Medicated and Controlled,(+) Cirrhosis       , Hepatitis -, CHCV untreated, Pancreatic carcinoma Pancreatic mass   Endo/Other  negative endocrine ROS  Renal/GU negative Renal ROS  negative genitourinary   Musculoskeletal  (+) Arthritis , Osteoarthritis,  ACDF   Abdominal Normal abdominal exam  (+)   Peds negative pediatric ROS (+)  Hematology   Anesthesia Other Findings Pancreatic cancer  Reproductive/Obstetrics negative OB ROS                             Anesthesia Physical  Anesthesia Plan  ASA: IV  Anesthesia Plan: General   Post-op Pain Management:    Induction: Intravenous  PONV Risk Score and Plan: 1 and TIVA  Airway Management Planned: Oral ETT  Additional Equipment: None  Intra-op Plan:   Post-operative Plan: Extubation in OR  Informed Consent: I have reviewed the patients History and Physical, chart, labs and discussed the procedure including the  risks, benefits and alternatives for the proposed anesthesia with the patient or authorized representative who has indicated his/her understanding and acceptance.     Dental advisory given  Plan Discussed with: CRNA and Surgeon  Anesthesia Plan Comments:         Anesthesia Quick Evaluation

## 2019-08-21 NOTE — H&P (Signed)
Duane Alexander is an 64 y.o. male.   Chief Complaint: Patient is here for ERCP with biliary stenting. HPI: Patient is 64 year old Caucasian male who was diagnosed with pancreatic carcinoma back in March 2021.  She has been receiving preop chemotherapy.  Patient is scheduled for diagnostic laparoscopy and possible Whipple procedure in September 2023. Patient has noted worsening pain across his upper abdomen primarily right lower quadrant for the last 2 weeks or so.  He noted worsening pain with meals.  He has been on soft foods.  Patient states he has had pain since initial diagnosis but it has gotten worse.  He was seen by Dr. Delton Coombes yesterday and noted to be jaundiced.  His bilirubin was 6.8.  AP was 155 with AST of 66 and ALT of 43. Had abdominal pelvic CT yesterday which revealed slight decrease in size of pancreatic head mass with stable peripancreatic periportal and retroperitoneal lymph nodes.  He had dilated biliary system with obstruction in the region of bile duct secondary to pancreatic head mass.  He had new finding of pulmonary nodules worrisome for metastatic disease.  He also was noted to have small left sided pleural effusion as well as ascites.  Liver contour suggestive of cirrhosis. Patient therefore referred for biliary stenting to treat obstructive jaundice. Patient says his stool color has become light but not clay colored.  He denies fever chills. He does not drink alcohol but he does continue to smoke cigarettes.  He smokes about a pack a day.  Covid test is negative  Past Medical History:  Diagnosis Date  . Aortic atherosclerosis ([place])   . COPD (chronic obstructive pulmonary disease) ([place])    per CT findings 2019  . Family history of brain cancer   . Family history of colon cancer   . Family history of kidney cancer   . Family history of leukemia   . Family history of lung cancer   . Family history of ovarian cancer   . Family history of prostate cancer   . Family  history of stomach cancer   . GERD (gastroesophageal reflux disease)   . Hepatitis C    referred to hepatitis clinic 2019 but he never went  . Hyperlipidemia   . Hypertension   . Port-A-Cath in place 04/17/2019  . Smoker   . Wears glasses     Past Surgical History:  Procedure Laterality Date  . ANTERIOR CERVICAL DECOMP/DISCECTOMY FUSION  12/2017   Dr. Consuella Lose  . BIOPSY  03/11/2019   Procedure: BIOPSY;  Surgeon: [name]., MD;  Location: [place];  Service: Gastroenterology;;  . COLONOSCOPY N/A 01/19/2017   Procedure: COLONOSCOPY;  Surgeon: Rogene Houston, MD;  [facility];  Service: Endoscopy;  Laterality: N/A;  930-moved to 9:45 per Lelon Frohlich  . ESOPHAGOGASTRODUODENOSCOPY (EGD) WITH PROPOFOL N/A 03/11/2019   Procedure: ESOPHAGOGASTRODUODENOSCOPY (EGD) WITH PROPOFOL;  Surgeon: Rush Landmark Telford Nab., MD;  Location: [facility];  Service: Gastroenterology;  Laterality: N/A;  . EUS N/A 03/11/2019   Procedure: UPPER ENDOSCOPIC ULTRASOUND (EUS) RADIAL;  Surgeon: Rush Landmark Telford Nab., MD;  Location: [place];  Service: Gastroenterology;  Laterality: N/A;  . FINE NEEDLE ASPIRATION  03/11/2019   Procedure: FINE NEEDLE ASPIRATION (FNA) LINEAR;  Surgeon: [name]., MD;  Location: [place];  Service: Gastroenterology;;  . POLYPECTOMY  01/19/2017   Procedure: POLYPECTOMY;  Surgeon: Rogene Houston, MD;  [facility];  Service: Endoscopy;;  splenic flexure  . POLYPECTOMY  03/11/2019   Procedure:  POLYPECTOMY;  Surgeon: Mansouraty, Telford Nab., MD;  Location: [place];  Service: Gastroenterology;;  . Sol Passer PLACEMENT Left 04/15/2019   Procedure: INSERTION PORT-A-CATH;  Surgeon: Aviva Signs, MD;  Location: [facility];  Service: General;  Laterality: Left;  . Right eye surgery     as a child-removed muscle from leg and put in upper eye lid    Family History  Problem Relation Age of Onset  . Throat cancer Father 52       died in  his 37s of stomach issues?  . Lung cancer Brother 75       smoker, non-small cell  . Ovarian cancer Sister 47       GI related death  . Heart attack Maternal Grandmother   . Brain cancer Maternal Aunt        dx. in her 81s  . Lung cancer Maternal Uncle        dx. in his 72s-60s, smoker  . Stomach cancer Maternal Aunt        dx. in her 77s  . Colon cancer Cousin        dx. >50 - maternal cousin  . Kidney cancer Cousin        dx. in his 85s - maternal cousin  . Prostate cancer Cousin        dx. in his 1s - maternal cousin  . Leukemia Nephew 34  . Heart disease Neg Hx   . Hypertension Neg Hx   . Stroke Neg Hx    Social History:  reports that he has been smoking cigarettes. He has a 22.50 pack-year smoking history. He has never used smokeless tobacco. He reports previous alcohol use. He reports that he does not use drugs.  Allergies: No Known Allergies  Medications Prior to Admission  Medication Sig Dispense Refill  . docusate sodium (COLACE) 100 MG capsule Take 100 mg by mouth daily.    . fluorouracil CALGB 06301 in sodium chloride 0.9 % 150 mL Inject 2,400 mg/m2 into the vein over 48 hr.    . FLUOROURACIL IV Inject into the vein every 21 ( twenty-one) days.    . furosemide (LASIX) 20 MG tablet Take 1 tablet (20 mg total) by mouth daily as needed. 30 tablet 1  . HYDROmorphone (DILAUDID) 2 MG tablet Take 1 tablet (2 mg total) by mouth every 3 (three) hours as needed for severe pain. 60 tablet 0  . IRINOTECAN HCL IV Inject into the vein every 14 (fourteen) days.    [name] LEUCOVORIN CALCIUM IV Inject into the vein every 14 (fourteen) days.    [name] lidocaine-prilocaine (EMLA) cream Apply a small amount to port a cath site and cover with plastic wrap one hour prior to chemotherapy appointments 30 g 3  . lisinopril (ZESTRIL) 20 MG tablet TAKE 1 TABLET BY MOUTH DAILY 90 tablet 0  . loperamide (IMODIUM A-D) 2 MG tablet Take 2 at onset of diarrhea, then 1 tab after every watery bowel movement -  DO NOT EXCEED 8 tablets in a 24 hour period 100 tablet 1  . lovastatin (MEVACOR) 20 MG tablet TAKE 1 TABLET BY MOUTH AT BEDTIME 90 tablet 0  . melatonin 5 MG TABS Take 5 mg by mouth at bedtime.    . Multiple Vitamin (MULTIVITAMIN) tablet Take 1 tablet by mouth daily.    . OXALIPLATIN IV Inject into the vein every 14 (fourteen) days.    . DULoxetine (CYMBALTA) 30 MG capsule TAKE 1 CAPSULE(30 MG) BY MOUTH DAILY  30 capsule 0  . oxyCODONE (OXY IR/ROXICODONE) 5 MG immediate release tablet Take 2 tablets every 3 hours as needed for pain 224 tablet 0  . prochlorperazine (COMPAZINE) 10 MG tablet Take 1 tablet (10 mg total) by mouth every 6 (six) hours as needed (Nausea or vomiting). 60 tablet 3    Results for orders placed or performed during the hospital encounter of 08/21/19 (from the past 48 hour(s))  SARS Coronavirus 2 by RT PCR (hospital order, performed in [facility] hospital lab) Nasopharyngeal Nasopharyngeal Swab     Status: None   Collection Time: 08/21/19 10:40 AM   Specimen: Nasopharyngeal Swab  Result Value Ref Range   SARS Coronavirus 2 NEGATIVE NEGATIVE    Comment: (NOTE) SARS-CoV-2 target nucleic acids are NOT DETECTED.  The SARS-CoV-2 RNA is generally detectable in upper and lower respiratory specimens during the acute phase of infection. The lowest concentration of SARS-CoV-2 viral copies this assay can detect is 250 copies / mL. A negative result does not preclude SARS-CoV-2 infection and should not be used as the sole basis for treatment or other patient management decisions.  A negative result may occur with improper specimen collection / handling, submission of specimen other than nasopharyngeal swab, presence of viral mutation(s) within the areas targeted by this assay, and inadequate number of viral copies (<250 copies / mL). A negative result must be combined with clinical observations, patient history, and epidemiological information.  Fact Sheet for Patients:    StrictlyIdeas.no  Fact Sheet for Healthcare Providers: BankingDealers.co.za  This test is not yet approved or  cleared by the [place] FDA and has been authorized for detection and/or diagnosis of SARS-CoV-2 by FDA under an Emergency Use Authorization (EUA).  This EUA will remain in effect (meaning this test can be used) for the duration of the COVID-19 declaration under Section 564(b)(1) of the Act, 21 U.S.C. section 360bbb-3(b)(1), unless the authorization is terminated or revoked sooner.  Performed at [facility], [address], [place], [place] [address]    CT Abdomen Pelvis W Wo Contrast  Result Date: 08/20/2019 CLINICAL DATA:  Follow-up pancreatic cancer. EXAM: CT ABDOMEN AND PELVIS WITHOUT AND WITH CONTRAST TECHNIQUE: Multidetector CT imaging of the abdomen and pelvis was performed following the standard protocol before and following the bolus administration of intravenous contrast. CONTRAST:  134mL OMNIPAQUE IOHEXOL 300 MG/ML  SOLN COMPARISON:  CT scan 07/11/2019 and PET-CT 03/27/2019 FINDINGS: Lower chest: Small left pleural effusion is noted. The heart is normal in size. No pericardial effusion. There are new small pulmonary nodules worrisome for new metastatic disease. The largest nodule measures 9 mm at the left lung base just above the hemidiaphragm. On the coronal images this has a somewhat triangular appearance and could be a focus of subpleural atelectasis. Other sub 4 mm pulmonary nodules and both lower lobes are indeterminate but somewhat worrisome. Recommend short-term follow-up noncontrast chest CT in 3 months. Hepatobiliary: Stable cirrhotic changes involving the liver. No worrisome hepatic lesions to suggest metastatic disease. Progressive intrahepatic biliary dilatation is noted along with progressive common bile duct dilatation. The gallbladder is also now distended. Findings suspicious for compression of the common bile  duct in the head of the pancreas. Patient may need common bile duct stent. Recommend correlation with liver function studies. Pancreas: Infiltrating low-attenuation lesion in the pancreatic head is again demonstrated. This measures approximately 3.1 x 2.8 x 1.7 cm. It previously measured 3.6 x 2.2 x 2.1 cm. Slightly progressive dilatation of the main pancreatic duct  and progressive atrophy of the pancreatic tail. Spleen: Normal size.  No focal lesions. Adrenals/Urinary Tract: The adrenal glands and kidneys are unremarkable. Stomach/Bowel: The stomach, duodenum, small bowel and colon are grossly normal. Vascular/Lymphatic: Stable fairly extensive peripancreatic, periportal and retroperitoneal adenopathy. The aorta and branch vessels are patent. The portal vein is patent. The confluence of the SMV and splenic vein is irregular and somewhat compressed. No complete obstruction or thrombosis. Extensive portal venous collaterals noted. Reproductive: The prostate gland and seminal vesicles are unremarkable. Other: Moderate amount of free abdominal/pelvic fluid, new since prior study Musculoskeletal: No significant bony findings. IMPRESSION: 1. Slight interval decrease in size of the pancreatic head mass and stable fairly extensive peripancreatic, periportal and retroperitoneal adenopathy. 2. Progressive intrahepatic and common bile duct dilatation and gallbladder distension with findings suspicious for compression of the common bile duct in the head of the pancreas. Patient may need common bile duct stent. Recommend correlation with liver function studies. 3. Stable cirrhotic changes involving the liver but no findings for hepatic metastatic disease. 4. New small left pleural effusion and new moderate amount of free abdominal/pelvic fluid. 5. New small pulmonary nodules worrisome for new metastatic disease. Recommend short-term follow-up noncontrast chest CT in 3 months. 6. Aortic atherosclerosis. These results will be  called to the ordering clinician or representative by the Radiologist Assistant, and communication documented in the PACS or [employer]. Aortic Atherosclerosis (ICD10-I70.0). Electronically Signed   By: Marijo Sanes M.D.   On: 08/20/2019 15:31    Review of Systems  Blood pressure 103/68, pulse 73, temperature 97.9 F (36.6 C), temperature source Oral, resp. rate 14, SpO2 97 %. Physical Exam HENT:     Mouth/Throat:     Mouth: Mucous membranes are moist.     Pharynx: Oropharynx is clear.  Eyes:     General: Scleral icterus present.     Conjunctiva/sclera: Conjunctivae normal.  Cardiovascular:     Rate and Rhythm: Normal rate and regular rhythm.     Pulses: Normal pulses.     Heart sounds: Normal heart sounds.  Pulmonary:     Effort: Pulmonary effort is normal.     Breath sounds: Rhonchi present.  Abdominal:     Comments: Abdomen is full.  It is soft.  He has tenderness across upper abdomen which is mild to moderate.  No organomegaly or masses.  Musculoskeletal:        General: No swelling.     Cervical back: Neck supple.  Lymphadenopathy:     Cervical: No cervical adenopathy.  Skin:    General: Skin is warm and dry.  Neurological:     Mental Status: He is alert.      Assessment/Plan  Obstructive jaundice in a patient with known pancreatic adenocarcinoma who has been receiving chemotherapy prior to surgery.  He unfortunately also has developed new pulmonary nodules concerning for metastatic disease.  He also has developed small left-sided pleural effusion and mild ascites. Patient's rapid Covid test is negative. Procedure and risks reviewed with the patient and told him that if obstruction cannot be overcome with the guidewire stenting may not be possible. Vernard Gambles is agreeable.  Hildred Laser, MD 08/21/2019, 1:18 PM

## 2019-08-23 LAB — SURGICAL PATHOLOGY

## 2019-08-24 ENCOUNTER — Encounter (HOSPITAL_COMMUNITY): Payer: Self-pay

## 2019-08-26 ENCOUNTER — Encounter (HOSPITAL_COMMUNITY): Payer: Self-pay | Admitting: *Deleted

## 2019-08-26 ENCOUNTER — Other Ambulatory Visit (HOSPITAL_COMMUNITY): Payer: Self-pay

## 2019-08-26 ENCOUNTER — Telehealth (HOSPITAL_COMMUNITY): Payer: Self-pay | Admitting: Surgery

## 2019-08-26 ENCOUNTER — Other Ambulatory Visit (HOSPITAL_COMMUNITY): Payer: Self-pay | Admitting: Surgery

## 2019-08-26 MED ORDER — HYDROMORPHONE HCL 2 MG PO TABS
2.0000 mg | ORAL_TABLET | ORAL | 0 refills | Status: DC | PRN
Start: 2019-08-26 — End: 2019-09-02

## 2019-08-26 MED ORDER — HYDROMORPHONE HCL 2 MG PO TABS
2.0000 mg | ORAL_TABLET | ORAL | 0 refills | Status: DC | PRN
Start: 2019-08-26 — End: 2019-08-26

## 2019-08-26 NOTE — Telephone Encounter (Signed)
Pt had called for a refill on his Dilaudid but also mentioned that he had a stent placed last Wednesday, and has not had a BM since then.  He stated that his stomach is distended again and he wanted to let Dr. Delton Coombes know.  Pt's sister had also called and spoken with Diane about these issues.  Per Dr. Delton Coombes, the pt is to come in for labs on Wednesday.  Also, Dr. Delton Coombes said he would talk to Dr. Barry Dienes about the pt's bx results and would then discuss the results with the pt at his next appointment.  Pt verbalized understanding of these instructions and was told to call back if he had any more concerns.

## 2019-08-27 ENCOUNTER — Encounter (HOSPITAL_COMMUNITY): Payer: Self-pay | Admitting: Internal Medicine

## 2019-08-28 ENCOUNTER — Encounter (HOSPITAL_COMMUNITY): Payer: Self-pay

## 2019-08-28 ENCOUNTER — Inpatient Hospital Stay (HOSPITAL_COMMUNITY): Payer: BC Managed Care – PPO

## 2019-08-28 ENCOUNTER — Ambulatory Visit (HOSPITAL_COMMUNITY)
Admission: RE | Admit: 2019-08-28 | Discharge: 2019-08-28 | Disposition: A | Discharge status: Home / Self Care | Payer: BC Managed Care – PPO | Source: Ambulatory Visit | Attending: Hematology | Admitting: Hematology

## 2019-08-28 ENCOUNTER — Other Ambulatory Visit: Payer: Self-pay

## 2019-08-28 ENCOUNTER — Other Ambulatory Visit (HOSPITAL_COMMUNITY): Payer: Self-pay | Admitting: Surgery

## 2019-08-28 ENCOUNTER — Telehealth (HOSPITAL_COMMUNITY): Payer: Self-pay | Admitting: Surgery

## 2019-08-28 ENCOUNTER — Encounter (HOSPITAL_COMMUNITY): Payer: Self-pay | Admitting: Surgery

## 2019-08-28 DIAGNOSIS — R109 Unspecified abdominal pain: Secondary | ICD-10-CM | POA: Diagnosis not present

## 2019-08-28 DIAGNOSIS — Z808 Family history of malignant neoplasm of other organs or systems: Secondary | ICD-10-CM | POA: Diagnosis not present

## 2019-08-28 DIAGNOSIS — Z79899 Other long term (current) drug therapy: Secondary | ICD-10-CM | POA: Diagnosis not present

## 2019-08-28 DIAGNOSIS — Z806 Family history of leukemia: Secondary | ICD-10-CM | POA: Diagnosis not present

## 2019-08-28 DIAGNOSIS — C250 Malignant neoplasm of head of pancreas: Secondary | ICD-10-CM

## 2019-08-28 DIAGNOSIS — Z8042 Family history of malignant neoplasm of prostate: Secondary | ICD-10-CM | POA: Diagnosis not present

## 2019-08-28 DIAGNOSIS — Z800 Family history of malignant neoplasm of digestive organs: Secondary | ICD-10-CM | POA: Diagnosis not present

## 2019-08-28 DIAGNOSIS — Z8041 Family history of malignant neoplasm of ovary: Secondary | ICD-10-CM | POA: Diagnosis not present

## 2019-08-28 DIAGNOSIS — C251 Malignant neoplasm of body of pancreas: Secondary | ICD-10-CM | POA: Diagnosis not present

## 2019-08-28 DIAGNOSIS — K7460 Unspecified cirrhosis of liver: Secondary | ICD-10-CM | POA: Diagnosis not present

## 2019-08-28 DIAGNOSIS — K769 Liver disease, unspecified: Secondary | ICD-10-CM | POA: Diagnosis not present

## 2019-08-28 DIAGNOSIS — Z95828 Presence of other vascular implants and grafts: Secondary | ICD-10-CM

## 2019-08-28 DIAGNOSIS — R202 Paresthesia of skin: Secondary | ICD-10-CM | POA: Diagnosis not present

## 2019-08-28 DIAGNOSIS — R1013 Epigastric pain: Secondary | ICD-10-CM | POA: Diagnosis not present

## 2019-08-28 DIAGNOSIS — F17210 Nicotine dependence, cigarettes, uncomplicated: Secondary | ICD-10-CM | POA: Diagnosis not present

## 2019-08-28 DIAGNOSIS — Z8051 Family history of malignant neoplasm of kidney: Secondary | ICD-10-CM | POA: Diagnosis not present

## 2019-08-28 LAB — CBC WITH DIFFERENTIAL/PLATELET
Abs Immature Granulocytes: 0.03 10*3/uL (ref 0.00–0.07)
Basophils Absolute: 0 10*3/uL (ref 0.0–0.1)
Basophils Relative: 0 %
Eosinophils Absolute: 0.1 10*3/uL (ref 0.0–0.5)
Eosinophils Relative: 1 %
HCT: 35.4 % — ABNORMAL LOW (ref 39.0–52.0)
Hemoglobin: 11.4 g/dL — ABNORMAL LOW (ref 13.0–17.0)
Immature Granulocytes: 0 %
Lymphocytes Relative: 21 %
Lymphs Abs: 2 10*3/uL (ref 0.7–4.0)
MCH: 35.2 pg — ABNORMAL HIGH (ref 26.0–34.0)
MCHC: 32.2 g/dL (ref 30.0–36.0)
MCV: 109.3 fL — ABNORMAL HIGH (ref 80.0–100.0)
Monocytes Absolute: 1 10*3/uL (ref 0.1–1.0)
Monocytes Relative: 11 %
Neutro Abs: 6.1 10*3/uL (ref 1.7–7.7)
Neutrophils Relative %: 67 %
Platelets: 199 10*3/uL (ref 150–400)
RBC: 3.24 MIL/uL — ABNORMAL LOW (ref 4.22–5.81)
RDW: 13.5 % (ref 11.5–15.5)
WBC: 9.2 10*3/uL (ref 4.0–10.5)
nRBC: 0 % (ref 0.0–0.2)

## 2019-08-28 LAB — COMPREHENSIVE METABOLIC PANEL WITH GFR
ALT: 33 U/L (ref 0–44)
AST: 50 U/L — ABNORMAL HIGH (ref 15–41)
Albumin: 3.1 g/dL — ABNORMAL LOW (ref 3.5–5.0)
Alkaline Phosphatase: 100 U/L (ref 38–126)
Anion gap: 6 (ref 5–15)
BUN: 28 mg/dL — ABNORMAL HIGH (ref 8–23)
CO2: 24 mmol/L (ref 22–32)
Calcium: 8.3 mg/dL — ABNORMAL LOW (ref 8.9–10.3)
Chloride: 97 mmol/L — ABNORMAL LOW (ref 98–111)
Creatinine, Ser: 0.8 mg/dL (ref 0.61–1.24)
GFR calc Af Amer: >60 mL/min
GFR calc non Af Amer: >60 mL/min
Glucose, Bld: 111 mg/dL — ABNORMAL HIGH (ref 70–99)
Potassium: 4.6 mmol/L (ref 3.5–5.1)
Sodium: 127 mmol/L — ABNORMAL LOW (ref 135–145)
Total Bilirubin: 1.9 mg/dL — ABNORMAL HIGH (ref 0.3–1.2)
Total Protein: 6.8 g/dL (ref 6.5–8.1)

## 2019-08-28 MED ORDER — LACTULOSE 20 GM/30ML PO SOLN: ORAL | 1 refills | Status: AC

## 2019-08-28 MED ORDER — HEPARIN SOD (PORK) LOCK FLUSH 100 UNIT/ML IV SOLN
500.0000 [IU] | Freq: Once | INTRAVENOUS | Status: AC
  Administered 2019-08-28: 500 [IU] via INTRAVENOUS

## 2019-08-28 MED ORDER — SODIUM CHLORIDE 0.9% FLUSH
10.0000 mL | Freq: Once | INTRAVENOUS | Status: AC
  Administered 2019-08-28: 10 mL via INTRAVENOUS

## 2019-08-28 NOTE — Telephone Encounter (Signed)
The results of the KUB had been discussed with the pt and his sister before the pt left our office.  The pt and his sister understood that the KUB showed stool and gas, and that Dr. Delton Coombes wants the pt to try gas X over the counter and that a prescription for Lactulose will be sent to his pharmacy.    I spoke to the pt's sister over the phone to explain the instruction for the Lactulose-30 ml by mouth every 3 hours until the pt has a BM.  After the pt has a BM, then the lactulose is taken 30 ml by mouth once a day at night.  The pt's sister verbalized understanding and was told to call back if they had any more concerns.

## 2019-08-28 NOTE — Progress Notes (Signed)
Patients port flushed without difficulty.  Good blood return noted with no bruising or swelling noted at site.  Band aid applied.  VSS.  Pt states that he is constipated and has not had a BM since his stent placement on Aug 18.  Also, pt's abdomen is distended and he is having severe abdominal pain.  Dr. Delton Coombes was notified and an order for a stat KUB was placed.  Pt was told to sit in waiting room until the KUB is approved.  Pt verbalized understanding.

## 2019-08-29 ENCOUNTER — Other Ambulatory Visit (HOSPITAL_COMMUNITY): Payer: Self-pay | Admitting: *Deleted

## 2019-08-31 ENCOUNTER — Encounter (HOSPITAL_COMMUNITY): Payer: Self-pay | Admitting: *Deleted

## 2019-09-01 ENCOUNTER — Encounter (HOSPITAL_COMMUNITY): Payer: Self-pay | Admitting: *Deleted

## 2019-09-02 ENCOUNTER — Telehealth (HOSPITAL_COMMUNITY): Payer: Self-pay | Admitting: *Deleted

## 2019-09-02 ENCOUNTER — Other Ambulatory Visit (HOSPITAL_COMMUNITY): Payer: Self-pay | Admitting: *Deleted

## 2019-09-02 MED ORDER — HYDROMORPHONE HCL 2 MG PO TABS
2.0000 mg | ORAL_TABLET | ORAL | 0 refills | Status: AC | PRN
Start: 2019-09-02 — End: ?

## 2019-09-02 NOTE — Telephone Encounter (Signed)
I spoke with the pt and advised him that his pain medication had been sent in and that Dr.Katragadda recommended him taking two pills with severe pain. The Pt verbalized understanding.

## 2019-09-02 NOTE — Telephone Encounter (Signed)
I spoke with the pt about his pain medication and pain. He stated that he ran out of pain medication yesterday. He stated that he takes the Dilaudid every 3 hours as ordered and sometimes has to take a pill in between. He stated that the pain medication lasted for maybe 4-5 hours but he takes it every 3 hours. He stated that he is in severe pain and the "thing" he had done last week didn't help him at all. I advised the pt that I would send a message to Dr. Delton Coombes and discuss this issue with him and call him back with his recommendations.

## 2019-09-04 ENCOUNTER — Ambulatory Visit (HOSPITAL_COMMUNITY): Admission: RE | Admit: 2019-09-04 | Payer: BC Managed Care – PPO | Source: Ambulatory Visit

## 2019-09-05 ENCOUNTER — Telehealth (HOSPITAL_COMMUNITY): Payer: BC Managed Care – PPO | Admitting: Hematology

## 2019-09-06 ENCOUNTER — Emergency Department (HOSPITAL_COMMUNITY): Payer: BC Managed Care – PPO

## 2019-09-06 ENCOUNTER — Inpatient Hospital Stay (HOSPITAL_COMMUNITY)
Admission: EM | Admit: 2019-09-06 | Discharge: 2019-10-04 | DRG: 208 | Disposition: E | Payer: BC Managed Care – PPO | Attending: Emergency Medicine | Admitting: Emergency Medicine

## 2019-09-06 ENCOUNTER — Other Ambulatory Visit: Payer: Self-pay

## 2019-09-06 ENCOUNTER — Encounter (HOSPITAL_COMMUNITY): Payer: Self-pay | Admitting: Emergency Medicine

## 2019-09-06 DIAGNOSIS — K219 Gastro-esophageal reflux disease without esophagitis: Secondary | ICD-10-CM | POA: Diagnosis present

## 2019-09-06 DIAGNOSIS — R0789 Other chest pain: Secondary | ICD-10-CM | POA: Diagnosis not present

## 2019-09-06 DIAGNOSIS — C259 Malignant neoplasm of pancreas, unspecified: Secondary | ICD-10-CM

## 2019-09-06 DIAGNOSIS — Z808 Family history of malignant neoplasm of other organs or systems: Secondary | ICD-10-CM

## 2019-09-06 DIAGNOSIS — Z20822 Contact with and (suspected) exposure to covid-19: Secondary | ICD-10-CM | POA: Diagnosis not present

## 2019-09-06 DIAGNOSIS — Z800 Family history of malignant neoplasm of digestive organs: Secondary | ICD-10-CM

## 2019-09-06 DIAGNOSIS — Z801 Family history of malignant neoplasm of trachea, bronchus and lung: Secondary | ICD-10-CM

## 2019-09-06 DIAGNOSIS — E162 Hypoglycemia, unspecified: Secondary | ICD-10-CM | POA: Diagnosis present

## 2019-09-06 DIAGNOSIS — I454 Nonspecific intraventricular block: Secondary | ICD-10-CM | POA: Diagnosis not present

## 2019-09-06 DIAGNOSIS — J449 Chronic obstructive pulmonary disease, unspecified: Secondary | ICD-10-CM | POA: Diagnosis not present

## 2019-09-06 DIAGNOSIS — I959 Hypotension, unspecified: Secondary | ICD-10-CM | POA: Diagnosis present

## 2019-09-06 DIAGNOSIS — E872 Acidosis: Secondary | ICD-10-CM | POA: Diagnosis not present

## 2019-09-06 DIAGNOSIS — Z79899 Other long term (current) drug therapy: Secondary | ICD-10-CM | POA: Diagnosis not present

## 2019-09-06 DIAGNOSIS — N179 Acute kidney failure, unspecified: Secondary | ICD-10-CM | POA: Diagnosis present

## 2019-09-06 DIAGNOSIS — C7802 Secondary malignant neoplasm of left lung: Secondary | ICD-10-CM | POA: Diagnosis not present

## 2019-09-06 DIAGNOSIS — Z66 Do not resuscitate: Secondary | ICD-10-CM | POA: Diagnosis not present

## 2019-09-06 DIAGNOSIS — I468 Cardiac arrest due to other underlying condition: Secondary | ICD-10-CM | POA: Diagnosis present

## 2019-09-06 DIAGNOSIS — E785 Hyperlipidemia, unspecified: Secondary | ICD-10-CM | POA: Diagnosis present

## 2019-09-06 DIAGNOSIS — F17210 Nicotine dependence, cigarettes, uncomplicated: Secondary | ICD-10-CM | POA: Diagnosis not present

## 2019-09-06 DIAGNOSIS — E875 Hyperkalemia: Secondary | ICD-10-CM | POA: Diagnosis present

## 2019-09-06 DIAGNOSIS — J9601 Acute respiratory failure with hypoxia: Principal | ICD-10-CM

## 2019-09-06 DIAGNOSIS — Z515 Encounter for palliative care: Secondary | ICD-10-CM | POA: Diagnosis not present

## 2019-09-06 DIAGNOSIS — I469 Cardiac arrest, cause unspecified: Secondary | ICD-10-CM | POA: Diagnosis not present

## 2019-09-06 DIAGNOSIS — J189 Pneumonia, unspecified organism: Secondary | ICD-10-CM | POA: Diagnosis not present

## 2019-09-06 DIAGNOSIS — Z8042 Family history of malignant neoplasm of prostate: Secondary | ICD-10-CM

## 2019-09-06 DIAGNOSIS — C7801 Secondary malignant neoplasm of right lung: Secondary | ICD-10-CM | POA: Diagnosis present

## 2019-09-06 DIAGNOSIS — Z978 Presence of other specified devices: Secondary | ICD-10-CM

## 2019-09-06 DIAGNOSIS — I447 Left bundle-branch block, unspecified: Secondary | ICD-10-CM | POA: Diagnosis not present

## 2019-09-06 DIAGNOSIS — R079 Chest pain, unspecified: Secondary | ICD-10-CM | POA: Diagnosis not present

## 2019-09-06 DIAGNOSIS — C7800 Secondary malignant neoplasm of unspecified lung: Secondary | ICD-10-CM | POA: Diagnosis not present

## 2019-09-06 DIAGNOSIS — J90 Pleural effusion, not elsewhere classified: Secondary | ICD-10-CM | POA: Diagnosis not present

## 2019-09-06 LAB — CBC WITH DIFFERENTIAL/PLATELET
Abs Immature Granulocytes: 0.14 10*3/uL — ABNORMAL HIGH (ref 0.00–0.07)
Basophils Absolute: 0 10*3/uL (ref 0.0–0.1)
Basophils Relative: 0 %
Eosinophils Absolute: 0 10*3/uL (ref 0.0–0.5)
Eosinophils Relative: 0 %
HCT: 29.3 % — ABNORMAL LOW (ref 39.0–52.0)
Hemoglobin: 9.1 g/dL — ABNORMAL LOW (ref 13.0–17.0)
Immature Granulocytes: 2 %
Lymphocytes Relative: 8 %
Lymphs Abs: 0.7 10*3/uL (ref 0.7–4.0)
MCH: 35 pg — ABNORMAL HIGH (ref 26.0–34.0)
MCHC: 31.1 g/dL (ref 30.0–36.0)
MCV: 112.7 fL — ABNORMAL HIGH (ref 80.0–100.0)
Monocytes Absolute: 0.7 10*3/uL (ref 0.1–1.0)
Monocytes Relative: 7 %
Neutro Abs: 7.8 10*3/uL — ABNORMAL HIGH (ref 1.7–7.7)
Neutrophils Relative %: 83 %
Platelets: 161 10*3/uL (ref 150–400)
RBC: 2.6 MIL/uL — ABNORMAL LOW (ref 4.22–5.81)
RDW: 14.3 % (ref 11.5–15.5)
WBC: 9.4 10*3/uL (ref 4.0–10.5)
nRBC: 0.2 % (ref 0.0–0.2)

## 2019-09-06 LAB — LACTIC ACID, PLASMA: Lactic Acid, Venous: 9 mmol/L (ref 0.5–1.9)

## 2019-09-06 LAB — CBG MONITORING, ED: Glucose-Capillary: 66 mg/dL — ABNORMAL LOW (ref 70–99)

## 2019-09-06 LAB — PROTIME-INR
INR: 1.9 — ABNORMAL HIGH (ref 0.8–1.2)
Prothrombin Time: 20.7 seconds — ABNORMAL HIGH (ref 11.4–15.2)

## 2019-09-06 LAB — SARS CORONAVIRUS 2 BY RT PCR (HOSPITAL ORDER, PERFORMED IN ~~LOC~~ HOSPITAL LAB): SARS Coronavirus 2: NEGATIVE

## 2019-09-06 LAB — APTT: aPTT: 59 seconds — ABNORMAL HIGH (ref 24–36)

## 2019-09-06 MED ORDER — NOREPINEPHRINE 4 MG/250ML-% IV SOLN
INTRAVENOUS | Status: AC
  Administered 2019-09-07: 80 mg
  Filled 2019-09-06: qty 250

## 2019-09-06 MED ORDER — CALCIUM CHLORIDE 10 % IV SOLN
INTRAVENOUS | Status: AC | PRN
  Administered 2019-09-06: 1 g via INTRAVENOUS

## 2019-09-06 MED ORDER — VANCOMYCIN HCL 1500 MG/300ML IV SOLN
1500.0000 mg | Freq: Once | INTRAVENOUS | Status: AC
  Administered 2019-09-07: 1500 mg via INTRAVENOUS
  Filled 2019-09-06: qty 300

## 2019-09-06 MED ORDER — SODIUM BICARBONATE 8.4 % IV SOLN
INTRAVENOUS | Status: AC | PRN
  Administered 2019-09-06 (×3): 50 meq via INTRAVENOUS

## 2019-09-06 MED ORDER — METRONIDAZOLE IN NACL 5-0.79 MG/ML-% IV SOLN
500.0000 mg | Freq: Once | INTRAVENOUS | Status: AC
  Administered 2019-09-06: 500 mg via INTRAVENOUS
  Filled 2019-09-06: qty 100

## 2019-09-06 MED ORDER — SODIUM CHLORIDE 0.9 % IV BOLUS (SEPSIS)
1000.0000 mL | Freq: Once | INTRAVENOUS | Status: AC
  Administered 2019-09-07: 1000 mL via INTRAVENOUS

## 2019-09-06 MED ORDER — SODIUM CHLORIDE 0.9 % IV BOLUS (SEPSIS)
1000.0000 mL | Freq: Once | INTRAVENOUS | Status: AC
  Administered 2019-09-06: 1000 mL via INTRAVENOUS

## 2019-09-06 MED ORDER — DEXTROSE 50 % IV SOLN
INTRAVENOUS | Status: AC | PRN
  Administered 2019-09-06: 1 via INTRAVENOUS

## 2019-09-06 MED ORDER — SODIUM CHLORIDE 0.9 % IV SOLN
INTRAVENOUS | Status: AC | PRN
  Administered 2019-09-06: 2000 mL via INTRAVENOUS

## 2019-09-06 MED ORDER — LACTATED RINGERS IV SOLN
INTRAVENOUS | Status: DC
  Administered 2019-09-07 (×2): 150 mL/h via INTRAVENOUS

## 2019-09-06 MED ORDER — VANCOMYCIN HCL IN DEXTROSE 1-5 GM/200ML-% IV SOLN: 1000.0000 mg | Freq: Once | INTRAVENOUS | Status: DC

## 2019-09-06 MED ORDER — SODIUM CHLORIDE 0.9 % IV BOLUS (SEPSIS)
250.0000 mL | Freq: Once | INTRAVENOUS | Status: AC
  Administered 2019-09-07: 250 mL via INTRAVENOUS

## 2019-09-06 MED ORDER — SODIUM CHLORIDE 0.9 % IV SOLN
2.0000 g | Freq: Once | INTRAVENOUS | Status: AC
  Administered 2019-09-07: 2 g via INTRAVENOUS
  Filled 2019-09-06: qty 2

## 2019-09-06 MED ORDER — EPINEPHRINE 1 MG/10ML IJ SOSY
PREFILLED_SYRINGE | INTRAMUSCULAR | Status: AC | PRN
  Administered 2019-09-06 (×3): 1 via INTRAVENOUS

## 2019-09-06 MED ORDER — NOREPINEPHRINE 4 MG/250ML-% IV SOLN
INTRAVENOUS | Status: AC | PRN
  Administered 2019-09-06: 10 ug/min via INTRAVENOUS

## 2019-09-06 MED ORDER — NALOXONE HCL 0.4 MG/ML IJ SOLN
0.4000 mg | Freq: Once | INTRAMUSCULAR | Status: AC
  Administered 2019-09-06: 0.4 mg via INTRAVENOUS
  Filled 2019-09-06: qty 1

## 2019-09-06 NOTE — ED Provider Notes (Addendum)
[facility] EMERGENCY DEPARTMENT Provider Note   CSN: [id number] Arrival date & time: 09/30/2019  2124     History Chief Complaint  Patient presents with  . Respiratory Distress    altered mental status    Duane Alexander is a 64 y.o. male.  Patient brought in by EMS from [facility].  Patient has a history of end-stage pancreatic CVA.  In mid August did have a stent placed by Dr. Melony Overly.  Chest x-ray at that time showed evidence of metastatic disease to the lungs.  He lives with his sister.  Who we were told also is very sick with Covid.  EMS did not have too much information other than the patient had not been eating well for the past several days.  Patient arrived in significant respiratory distress low oxygen saturations.  Blood pressure was below 90.  Shortly after arrival eyes were preparing to intubate him he went into full cardiac arrest.  Asystole.  Patient was successfully intubated.  We started rounds of epinephrine.  Thought he may be in renal failure.  Also he did receive calcium chloride and 2 rounds of sodium bicarb.  This did seem to show some evidence of improvement he also received 3 rounds of epinephrine.  Patient appeared very sick he was blue on arrival.  Markedly distended abdomen bilateral leg swelling.  Patient's initial pancreatic cancer diagnosis was in March.        Past Medical History:  Diagnosis Date  . Aortic atherosclerosis ([place])   . COPD (chronic obstructive pulmonary disease) ([place])    per CT findings 2019  . Family history of brain cancer   . Family history of colon cancer   . Family history of kidney cancer   . Family history of leukemia   . Family history of lung cancer   . Family history of ovarian cancer   . Family history of prostate cancer   . Family history of stomach cancer   . GERD (gastroesophageal reflux disease)   . Hepatitis C    referred to hepatitis clinic 2019 but he never went  . Hyperlipidemia   . Hypertension   .  Port-A-Cath in place 04/17/2019  . Smoker   . Wears glasses     Patient Active Problem List   Diagnosis Date Noted  . Strabismic amblyopia 06/25/2019  . Genetic testing 04/25/2019  . Port-A-Cath in place 04/17/2019  . Family history of ovarian cancer   . Family history of leukemia   . Family history of brain cancer   . Family history of stomach cancer   . Family history of colon cancer   . Family history of kidney cancer   . Family history of prostate cancer   . Pancreatic cancer ([place]) 03/28/2019  . Epigastric pain 02/08/2019  . Cirrhosis of liver without ascites ([place]) 01/23/2019  . Abdominal pain 01/23/2019  . Noncompliance 08/10/2018  . Vaccine counseling 08/10/2018  . Need for pneumococcal vaccination 08/10/2018  . Neuropathy 08/10/2018  . Chronic pain of both upper extremities 08/10/2018  . Chronic pain of both lower extremities 08/10/2018  . Chronic hepatitis C without hepatic coma ([place]) 10/20/2017  . Coronary artery calcification seen on CT scan 09/05/2017  . DDD (degenerative disc disease), lumbar 09/01/2017  . COPD (chronic obstructive pulmonary disease) ([place]) 07/01/2016  . Encounter for health maintenance examination in adult 06/17/2016  . Hyperlipidemia 06/02/2016  . Aortic atherosclerosis ([place]) 05/19/2016  . Screening for prostate cancer 05/16/2016  . Hypertension 05/10/2016  .  Screen for colon cancer 05/10/2016  . Family history of lung cancer 05/10/2016  . Chronic left shoulder pain 04/28/2016  . Tinnitus 04/28/2016  . Smoking 04/28/2016  . Wheezing 04/28/2016    Past Surgical History:  Procedure Laterality Date  . ANTERIOR CERVICAL DECOMP/DISCECTOMY FUSION  12/2017   Dr. Consuella Lose  . BILIARY STENT PLACEMENT N/A 08/21/2019   Procedure: BILIARY STENT PLACEMENT;  Surgeon: Rogene Houston, MD;  Location[facility];  Service: Endoscopy;  Laterality: N/A;  . BIOPSY  03/11/2019   Procedure: BIOPSY;  Surgeon: [name]., MD;  Location: [place];  Service: Gastroenterology;;  . COLONOSCOPY N/A 01/19/2017   Procedure: COLONOSCOPY;  Surgeon: Rogene Houston, MD;  [facility];  Service: Endoscopy;  Laterality: N/A;  930-moved to 9:45 per Lelon Frohlich  . ERCP N/A 08/21/2019   Procedure: ENDOSCOPIC RETROGRADE CHOLANGIOPANCREATOGRAPHY (ERCP);  Surgeon: Rogene Houston, MD;  [facility];  Service: Endoscopy;  Laterality: N/A;  . ESOPHAGOGASTRODUODENOSCOPY (EGD) WITH PROPOFOL N/A 03/11/2019   Procedure: ESOPHAGOGASTRODUODENOSCOPY (EGD) WITH PROPOFOL;  Surgeon: Rush Landmark Telford Nab., MD;  Location: [place];  Service: Gastroenterology;  Laterality: N/A;  . EUS N/A 03/11/2019   Procedure: UPPER ENDOSCOPIC ULTRASOUND (EUS) RADIAL;  Surgeon: Rush Landmark Telford Nab., MD;  Location: [place];  Service: Gastroenterology;  Laterality: N/A;  . FINE NEEDLE ASPIRATION  03/11/2019   Procedure: FINE NEEDLE ASPIRATION (FNA) LINEAR;  Surgeon: [name]., MD;  Location: [place];  Service: Gastroenterology;;  . POLYPECTOMY  01/19/2017   Procedure: POLYPECTOMY;  Surgeon: Rogene Houston, MD;  [facility];  Service: Endoscopy;;  splenic flexure  . POLYPECTOMY  03/11/2019   Procedure: POLYPECTOMY;  Surgeon: Mansouraty, Telford Nab., MD;  Location: [place];  Service: Gastroenterology;;  . Sol Passer PLACEMENT Left 04/15/2019   Procedure: INSERTION PORT-A-CATH;  Surgeon: Aviva Signs, MD;  Location: [facility];  Service: General;  Laterality: Left;  . Right eye surgery     as a child-removed muscle from leg and put in upper eye lid  . SPHINCTEROTOMY N/A 08/21/2019   Procedure: SPHINCTEROTOMY;  Surgeon: Rogene Houston, MD;  [facility];  Service: Endoscopy;  Laterality: N/A;       Family History  Problem Relation Age of Onset  . Throat cancer Father 77       died in his 89s of stomach issues?  . Lung cancer Brother 53       smoker, non-small cell  . Ovarian cancer Sister 75       GI  related death  . Heart attack Maternal Grandmother   . Brain cancer Maternal Aunt        dx. in her 32s  . Lung cancer Maternal Uncle        dx. in his 64s-60s, smoker  . Stomach cancer Maternal Aunt        dx. in her 53s  . Colon cancer Cousin        dx. >50 - maternal cousin  . Kidney cancer Cousin        dx. in his 57s - maternal cousin  . Prostate cancer Cousin        dx. in his 54s - maternal cousin  . Leukemia Nephew 34  . Heart disease Neg Hx   . Hypertension Neg Hx   . Stroke Neg Hx     Social History   Tobacco Use  . Smoking status: Current Every Day Smoker  Packs/day: 0.50    Years: 45.00    Pack years: 22.50    Types: Cigarettes  . Smokeless tobacco: Never Used  Vaping Use  . Vaping Use: Never used  Substance Use Topics  . Alcohol use: Not Currently  . Drug use: No    Home Medications Prior to Admission medications   Medication Sig Start Date End Date Taking? Authorizing Provider  docusate sodium (COLACE) 100 MG capsule Take 100 mg by mouth daily.    [provider]  DULoxetine (CYMBALTA) 30 MG capsule TAKE 1 CAPSULE(30 MG) BY MOUTH DAILY 07/16/19   Francene Finders L, NP-C  fluorouracil CALGB 53976 in sodium chloride 0.9 % 150 mL Inject 2,400 mg/m2 into the vein over 48 hr.    [provider]  FLUOROURACIL IV Inject into the vein every 21 ( twenty-one) days. 04/23/19   [provider]  furosemide (LASIX) 20 MG tablet Take 1 tablet (20 mg total) by mouth daily as needed. 07/23/19   Derek Jack, MD  HYDROmorphone (DILAUDID) 2 MG tablet Take 1 tablet (2 mg total) by mouth every 3 (three) hours as needed for severe pain. 09/02/19   Derek Jack, MD  IRINOTECAN HCL IV Inject into the vein every 14 (fourteen) days. 04/23/19   [provider]  Lactulose 20 GM/30ML SOLN Take 30 ml by mouth every 3 hours until you have a bowel movement.  Once you have a bowel movement, take 30 ml by mouth daily at night. 08/28/19    Derek Jack, MD  LEUCOVORIN CALCIUM IV Inject into the vein every 14 (fourteen) days. 04/23/19   [provider]  lidocaine-prilocaine (EMLA) cream Apply a small amount to port a cath site and cover with plastic wrap one hour prior to chemotherapy appointments 04/17/19   Derek Jack, MD  lisinopril (ZESTRIL) 20 MG tablet TAKE 1 TABLET BY MOUTH DAILY 07/24/19   Tysinger, [name], PA-C  loperamide (IMODIUM A-D) 2 MG tablet Take 2 at onset of diarrhea, then 1 tab after every watery bowel movement - DO NOT EXCEED 8 tablets in a 24 hour period 04/17/19   Derek Jack, MD  lovastatin (MEVACOR) 20 MG tablet TAKE 1 TABLET BY MOUTH AT BEDTIME 07/24/19   Tysinger, [name], PA-C  melatonin 5 MG TABS Take 5 mg by mouth at bedtime.    [provider]  Multiple Vitamin (MULTIVITAMIN) tablet Take 1 tablet by mouth daily.    [provider]  OXALIPLATIN IV Inject into the vein every 14 (fourteen) days. 04/23/19   [provider]  oxyCODONE (OXY IR/ROXICODONE) 5 MG immediate release tablet Take 2 tablets every 3 hours as needed for pain 08/09/19   Lockamy, Randi L, NP-C  pantoprazole (PROTONIX) 40 MG tablet Take 1 tablet (40 mg total) by mouth daily before breakfast. 08/21/19   Rehman, Mechele Dawley, MD  prochlorperazine (COMPAZINE) 10 MG tablet Take 1 tablet (10 mg total) by mouth every 6 (six) hours as needed (Nausea or vomiting). 04/17/19   Derek Jack, MD    Allergies    Patient has no known allergies.  Review of Systems   Review of Systems  Unable to perform ROS: Severe respiratory distress    Physical Exam Updated Vital Signs BP (!) 64/33   Pulse (!) 117   Temp (!) 94.3 F (34.6 C)   Resp (!) 23   Ht 1.727 m (5\' 8" )   Wt 72.6 kg   SpO2 (!) 85% Comment: poor wave form- pt on vent  BMI 24.33 kg/m   Physical Exam Constitutional:      General: He is not in acute distress.    Appearance: He is not ill-appearing or toxic-appearing.  HENT:       Head: Atraumatic.     Mouth/Throat:     Mouth: Mucous membranes are dry.  Eyes:     General: Scleral icterus present.     Pupils: Pupils are equal, round, and reactive to light.  Cardiovascular:     Rate and Rhythm: Tachycardia present.  Pulmonary:     Effort: Respiratory distress present.  Abdominal:     General: There is distension.  Musculoskeletal:     Right lower leg: Edema present.     Left lower leg: Edema present.  Skin:    Coloration: Skin is jaundiced.  Neurological:     Comments: Awake and mumbling some but not communicating well.  Did move all 4 extremities.  Very sick in appearance.     ED Results / Procedures / Treatments   Labs (all labs ordered are listed, but only abnormal results are displayed) Labs Reviewed  LACTIC ACID, PLASMA - Abnormal; Notable for the following components:      Result Value   Lactic Acid, Venous 9.0 (*)    All other components within normal limits  COMPREHENSIVE METABOLIC PANEL - Abnormal; Notable for the following components:   Sodium 127 (*)    Potassium 7.1 (*)    Chloride 91 (*)    CO2 17 (*)    Glucose, Bld 177 (*)    BUN 85 (*)    Creatinine, Ser 6.52 (*)    Calcium 8.1 (*)    Total Protein 4.6 (*)    Albumin 1.8 (*)    AST 369 (*)    ALT 130 (*)    Total Bilirubin 2.4 (*)    GFR calc non Af Amer 8 (*)    GFR calc Af Amer 9 (*)    Anion gap 19 (*)    All other components within normal limits  CBC WITH DIFFERENTIAL/PLATELET - Abnormal; Notable for the following components:   RBC 2.60 (*)    Hemoglobin 9.1 (*)    HCT 29.3 (*)    MCV 112.7 (*)    MCH 35.0 (*)    Neutro Abs 7.8 (*)    Abs Immature Granulocytes 0.14 (*)    All other components within normal limits  PROTIME-INR - Abnormal; Notable for the following components:   Prothrombin Time 20.7 (*)    INR 1.9 (*)    All other components within normal limits  APTT - Abnormal; Notable for the following components:   aPTT 59 (*)    All other components  within normal limits  CBG MONITORING, ED - Abnormal; Notable for the following components:   Glucose-Capillary 66 (*)    All other components within normal limits  SARS CORONAVIRUS 2 BY RT PCR (HOSPITAL ORDER, [place] [facility])  CULTURE, BLOOD (ROUTINE X 2)  CULTURE, BLOOD (ROUTINE X 2)  URINE CULTURE  LIPASE, BLOOD  LACTIC ACID, PLASMA  URINALYSIS, ROUTINE W REFLEX MICROSCOPIC  BLOOD GAS, ARTERIAL    EKG None  Radiology DG Chest Port 1 View  Result Date: 09/12/2019 CLINICAL DATA:  Altered mental status, respiratory distress EXAM: PORTABLE CHEST 1 VIEW COMPARISON:  04/15/2019 FINDINGS: Endotracheal tube is 7 cm above the carina. Left Port-A-Cath remains in place, unchanged with the tip in the SVC. NG tube is in the stomach. Patchy  bilateral airspace disease. Heart is normal size. No effusions or pneumothorax. No acute bony abnormality. IMPRESSION: Patchy bilateral airspace disease compatible with pneumonia. COVID pneumonia could have this appearance. Electronically Signed   By: Rolm Baptise M.D.   On: 10/03/2019 23:38    Procedures Date/Time: September 15, 2019 12:12 AM Performed by: Fredia Sorrow, MD Oxygen Delivery Method: Ambu bag Preoxygenation: Pre-oxygenation with 100% oxygen Ventilation: Mask ventilation without difficulty Laryngoscope Size: Glidescope and 4 Tube size: 8.0 mm Number of attempts: 1 Airway Equipment and Method: Rigid stylet Placement Confirmation: ETT inserted through vocal cords under direct vision,  CO2 detector and Breath sounds checked- equal and bilateral Secured at: 24 cm Tube secured with: ETT holder      (including critical care time)  CRITICAL CARE Performed by: Fredia Sorrow Total critical care time: 100 minutes Critical care time was exclusive of separately billable procedures and treating other patients. Critical care was necessary to treat or prevent imminent or life-threatening deterioration. Critical care was time  spent personally by me on the following activities: development of treatment plan with patient and/or surrogate as well as nursing, discussions with consultants, evaluation of patient's response to treatment, examination of patient, obtaining history from patient or surrogate, ordering and performing treatments and interventions, ordering and review of laboratory studies, ordering and review of radiographic studies, pulse oximetry and re-evaluation of patient's condition.   Medications Ordered in ED Medications  lactated ringers infusion (150 mL/hr Intravenous New Bag/Given 09/15/2019 0008)  ceFEPIme (MAXIPIME) 2 g in sodium chloride 0.9 % 100 mL IVPB (has no administration in time range)  metroNIDAZOLE (FLAGYL) IVPB 500 mg (500 mg Intravenous New Bag/Given 10/01/2019 2333)  sodium chloride 0.9 % bolus 1,000 mL (1,000 mLs Intravenous New Bag/Given 09/25/2019 2313)    And  sodium chloride 0.9 % bolus 1,000 mL (1,000 mLs Intravenous New Bag/Given 2019-09-15 0010)    And  sodium chloride 0.9 % bolus 250 mL (250 mLs Intravenous New Bag/Given 09-15-19 0011)  vancomycin (VANCOREADY) IVPB 1500 mg/300 mL (has no administration in time range)  norepinephrine (LEVOPHED) 16 mg in 268mL premix infusion (0 mcg/min Intravenous Not Given 2019/09/15 0010)  0.9 %  sodium chloride infusion (2,000 mLs Intravenous New Bag/Given 09/13/2019 2200)  EPINEPHrine (ADRENALIN) 1 MG/10ML injection (1 Syringe Intravenous Given 09/17/2019 2208)  norepinephrine (LEVOPHED) 4mg  in 229mL premix infusion (80 mcg/min Intravenous Rate/Dose Change 09/19/2019 2335)  sodium bicarbonate injection (50 mEq Intravenous Given 09/21/2019 2227)  dextrose 50 % solution (1 ampule Intravenous Given 09/30/2019 2224)  calcium chloride injection (1 g Intravenous Given 09/26/2019 2212)  naloxone [facility]) injection 0.4 mg (0.4 mg Intravenous Given 09/06/19 2336)  norepinephrine (LEVOPHED) 4-5 MG/250ML-% infusion SOLN (80 mg  New Bag/Given 09-15-19 0009)    ED Course  I have reviewed the triage  vital signs and the nursing notes.  Pertinent labs & imaging results that were available during my care of the patient were reviewed by me and considered in my medical decision making (see chart for details).    MDM Rules/Calculators/A&P                          Stated in the HPI patient went into cardiac arrest shortly after arrival.  Did seem to respond following intubation to epinephrine and calcium chloride and sodium bicarb.  Was suspicious that he may be in acute renal failure.  Patient remained very hypotensive.  Will additionally get 3 rounds of epinephrine.  Were then started on norepinephrine drip.  Had to go up to 40 mics drip.  Blood pressures were still below 90.  Patient very unstable.  Family stated that he been taken a lot of pain medicine as well as not eating.  So patient given some Narcan.  Did perk up some with that and was moving.  Blood pressures improved some.  May redose that.  Labs are now showing that he is in acute renal failure.  Extremely acidotic.  Broad-spectrum antibiotics ordered in case of sepsis.  Chest x-ray consistent with multifocal pneumonia.  But his Covid testing was negative however his sister is sick with Covid.  But he also did aspirate only tried intubate him with coffee-ground emesis.  Contacted patient's sister also spoke with her husband patient's brother-in-law.  Patient was a full code.  He was thinking about end-of-life decisions.  But had not formalized anything.  They were not aware of him expressing any concerns or desires not to be on life support long-term.  Will I let them know that he was extremely sick he already went into cardiac arrest once and was unlikely to make it through the night and we would continue to resuscitate but it may not be successful.  They were aware of that.  An admission I did discuss with Dr. Madalyn Rob from critical care.  He felt that currently he was too unstable to accept the critical care service at [facility] plus there was  no beds currently.  Felt that due to his diagnosis of pancreatic CA that he it would be best if he would be admitted to the hospitalist service here.  They are to speak with Dr. Olevia Bowens.  He will come see the patient.  Turn patient over to Dr. Melanee Left who will continue the resuscitation efforts.  Feel that the may not be successful.  Were going to continue to try to help his renal function and his hyperkalemia.  Addition patient's blood sugar was about 66 he did receive an amp of D50.  66 was not enough to really result in the altered mental status.  If patient does improve.  Would consider CT abdomen and CT head.  Patient could be through critical care if he does stabilize or with hospitalist service here.  Critical care would have to be recontacted.   Summary of the cardiac arrest.  Patient had chest compressions started.  He was intubated.  Patient started on epinephrine patient's received 3 A of epinephrine.  He received 3 A of bicarb and 1 amp of calcium chloride.  He did improve pressures did come up.  But then they waxed and waned and dropped off.  And patient was started on norepinephrine eventually up to 40 mics.  Blood pressures have remained very unstable.  Patient received chest compressions for about 22 minutes.  Clinically was surprised that he did come around on the resuscitation.  Patient was very ill.  Patient does have a port in place left upper chest.  We have used this for the resuscitation.  If he does improve may require central line.  But for now the port is adequate.  In addition patient's brother-in-law is Hope Budds.  His cell phone number is [phone].  He can be contacted as well as the patient's sister.  Her number is in our system.   Final Clinical Impression(s) / ED Diagnoses Final diagnoses:  Cardiac arrest ([place])  Acute respiratory failure with hypoxia ([place])  Pancreatic cancer metastasized to lung [facility])    Rx / DC Orders ED Discharge  Orders    None        Fredia Sorrow, MD 12-Sep-2019 [id number]    Fredia Sorrow, MD 2019/09/12 [phone]

## 2019-09-06 NOTE — Progress Notes (Signed)
0.4mg  narcan given.  Patient is now alert, responsive, and tracks with eyes and shakes head appropriately.

## 2019-09-06 NOTE — ED Triage Notes (Signed)
Patient brought in by EMS for altered mental status and respiratory distress. Patient has a hx of pancreatic cancer and states pain everywhere. Patient is grey in color at this time.

## 2019-09-06 NOTE — Progress Notes (Signed)
Pharmacy Antibiotic Note  Joahan Swatzell is a 64 y.o. male admitted on 09/13/2019 with sepsis.  Pharmacy has been consulted for vancomycin and cefepime dosing. Cefepime 2gm ordered in the ED  Plan: Continue cefepime 2gm IV q8 hours Vancomycin 1500 mg IV x 1 then 750 mg IV q12 hours F/u renal function, cultures and clinical course  Height: 5\' 8"  (172.7 cm) Weight: 72.6 kg (160 lb) IBW/kg (Calculated) : 68.4  No data recorded.  No results for input(s): WBC, CREATININE, LATICACIDVEN, VANCOTROUGH, VANCOPEAK, VANCORANDOM, GENTTROUGH, GENTPEAK, GENTRANDOM, TOBRATROUGH, TOBRAPEAK, TOBRARND, AMIKACINPEAK, AMIKACINTROU, AMIKACIN in the last 168 hours.  Estimated Creatinine Clearance: 87.9 mL/min (by C-G formula based on SCr of 0.8 mg/dL).    No Known Allergies  Thank you for allowing pharmacy to be a part of this patient's care.  Beverlee Nims 09/25/2019 10:57 PM

## 2019-09-07 ENCOUNTER — Emergency Department (HOSPITAL_COMMUNITY): Payer: BC Managed Care – PPO

## 2019-09-07 ENCOUNTER — Other Ambulatory Visit: Payer: Self-pay

## 2019-09-07 ENCOUNTER — Telehealth: Payer: Self-pay | Admitting: Family Medicine

## 2019-09-07 DIAGNOSIS — Z515 Encounter for palliative care: Secondary | ICD-10-CM | POA: Diagnosis not present

## 2019-09-07 DIAGNOSIS — K219 Gastro-esophageal reflux disease without esophagitis: Secondary | ICD-10-CM | POA: Diagnosis present

## 2019-09-07 DIAGNOSIS — E785 Hyperlipidemia, unspecified: Secondary | ICD-10-CM | POA: Diagnosis present

## 2019-09-07 DIAGNOSIS — Z808 Family history of malignant neoplasm of other organs or systems: Secondary | ICD-10-CM | POA: Diagnosis not present

## 2019-09-07 DIAGNOSIS — C7802 Secondary malignant neoplasm of left lung: Secondary | ICD-10-CM | POA: Diagnosis present

## 2019-09-07 DIAGNOSIS — Z66 Do not resuscitate: Secondary | ICD-10-CM | POA: Diagnosis not present

## 2019-09-07 DIAGNOSIS — J449 Chronic obstructive pulmonary disease, unspecified: Secondary | ICD-10-CM | POA: Diagnosis present

## 2019-09-07 DIAGNOSIS — E875 Hyperkalemia: Secondary | ICD-10-CM | POA: Diagnosis present

## 2019-09-07 DIAGNOSIS — Z801 Family history of malignant neoplasm of trachea, bronchus and lung: Secondary | ICD-10-CM | POA: Diagnosis not present

## 2019-09-07 DIAGNOSIS — I959 Hypotension, unspecified: Secondary | ICD-10-CM | POA: Diagnosis present

## 2019-09-07 DIAGNOSIS — N179 Acute kidney failure, unspecified: Secondary | ICD-10-CM | POA: Diagnosis present

## 2019-09-07 DIAGNOSIS — Z800 Family history of malignant neoplasm of digestive organs: Secondary | ICD-10-CM | POA: Diagnosis not present

## 2019-09-07 DIAGNOSIS — E872 Acidosis: Secondary | ICD-10-CM | POA: Diagnosis present

## 2019-09-07 DIAGNOSIS — C7801 Secondary malignant neoplasm of right lung: Secondary | ICD-10-CM | POA: Diagnosis present

## 2019-09-07 DIAGNOSIS — Z79899 Other long term (current) drug therapy: Secondary | ICD-10-CM | POA: Diagnosis not present

## 2019-09-07 DIAGNOSIS — J9601 Acute respiratory failure with hypoxia: Secondary | ICD-10-CM | POA: Diagnosis present

## 2019-09-07 DIAGNOSIS — C259 Malignant neoplasm of pancreas, unspecified: Secondary | ICD-10-CM | POA: Diagnosis present

## 2019-09-07 DIAGNOSIS — Z20822 Contact with and (suspected) exposure to covid-19: Secondary | ICD-10-CM | POA: Diagnosis present

## 2019-09-07 DIAGNOSIS — E162 Hypoglycemia, unspecified: Secondary | ICD-10-CM | POA: Diagnosis present

## 2019-09-07 DIAGNOSIS — I468 Cardiac arrest due to other underlying condition: Secondary | ICD-10-CM | POA: Diagnosis present

## 2019-09-07 DIAGNOSIS — Z8042 Family history of malignant neoplasm of prostate: Secondary | ICD-10-CM | POA: Diagnosis not present

## 2019-09-07 DIAGNOSIS — F17210 Nicotine dependence, cigarettes, uncomplicated: Secondary | ICD-10-CM | POA: Diagnosis present

## 2019-09-07 LAB — COMPREHENSIVE METABOLIC PANEL
ALT: 130 U/L — ABNORMAL HIGH (ref 0–44)
AST: 369 U/L — ABNORMAL HIGH (ref 15–41)
Albumin: 1.8 g/dL — ABNORMAL LOW (ref 3.5–5.0)
Alkaline Phosphatase: 89 U/L (ref 38–126)
Anion gap: 19 — ABNORMAL HIGH (ref 5–15)
BUN: 85 mg/dL — ABNORMAL HIGH (ref 8–23)
CO2: 17 mmol/L — ABNORMAL LOW (ref 22–32)
Calcium: 8.1 mg/dL — ABNORMAL LOW (ref 8.9–10.3)
Chloride: 91 mmol/L — ABNORMAL LOW (ref 98–111)
Creatinine, Ser: 6.52 mg/dL — ABNORMAL HIGH (ref 0.61–1.24)
GFR calc Af Amer: 9 mL/min — ABNORMAL LOW (ref >60)
GFR calc non Af Amer: 8 mL/min — ABNORMAL LOW (ref >60)
Glucose, Bld: 177 mg/dL — ABNORMAL HIGH (ref 70–99)
Potassium: 7.1 mmol/L (ref 3.5–5.1)
Sodium: 127 mmol/L — ABNORMAL LOW (ref 135–145)
Total Bilirubin: 2.4 mg/dL — ABNORMAL HIGH (ref 0.3–1.2)
Total Protein: 4.6 g/dL — ABNORMAL LOW (ref 6.5–8.1)

## 2019-09-07 LAB — BLOOD GAS, ARTERIAL
Acid-base deficit: 14.5 mmol/L — ABNORMAL HIGH (ref 0.0–2.0)
Acid-base deficit: 16.7 mmol/L — ABNORMAL HIGH (ref 0.0–2.0)
Bicarbonate: 11.7 mmol/L — ABNORMAL LOW (ref 20.0–28.0)
Bicarbonate: 9.4 mmol/L — ABNORMAL LOW (ref 20.0–28.0)
FIO2: 100
FIO2: 100
O2 Saturation: 30 %
O2 Saturation: 69.3 %
Patient temperature: 35.4
Patient temperature: 35.5
pCO2 arterial: 65.6 mmHg (ref 32.0–48.0)
pCO2 arterial: 84.5 mmHg (ref 32.0–48.0)
pH, Arterial: 6.87 — CL (ref 7.350–7.450)
pH, Arterial: 6.99 — CL (ref 7.350–7.450)
pO2, Arterial: 32 mmHg — CL (ref 83.0–108.0)
pO2, Arterial: 58 mmHg — ABNORMAL LOW (ref 83.0–108.0)

## 2019-09-07 LAB — LACTIC ACID, PLASMA: Lactic Acid, Venous: 9.6 mmol/L (ref 0.5–1.9)

## 2019-09-07 LAB — LIPASE, BLOOD: Lipase: 42 U/L (ref 11–51)

## 2019-09-07 LAB — CBG MONITORING, ED: Glucose-Capillary: 81 mg/dL (ref 70–99)

## 2019-09-07 MED ORDER — SODIUM BICARBONATE 8.4 % IV SOLN
INTRAVENOUS | Status: AC | PRN
  Administered 2019-09-07: 50 meq via INTRAVENOUS

## 2019-09-07 MED ORDER — CALCIUM GLUCONATE 10 % IV SOLN
1.0000 g | Freq: Once | INTRAVENOUS | Status: AC
  Administered 2019-09-07: 1 g via INTRAVENOUS
  Filled 2019-09-07: qty 10

## 2019-09-07 MED ORDER — CALCIUM CHLORIDE 10 % IV SOLN
INTRAVENOUS | Status: AC
  Filled 2019-09-07: qty 20

## 2019-09-07 MED ORDER — SODIUM BICARBONATE 8.4 % IV SOLN
INTRAVENOUS | Status: AC
  Filled 2019-09-07: qty 150

## 2019-09-07 MED ORDER — VASOPRESSIN 20 UNITS/100 ML INFUSION FOR SHOCK
0.0000 [IU]/min | INTRAVENOUS | Status: DC
  Administered 2019-09-07: 0.04 [IU]/min via INTRAVENOUS
  Administered 2019-09-07: 0.03 [IU]/min via INTRAVENOUS
  Filled 2019-09-07: qty 100

## 2019-09-07 MED ORDER — SODIUM BICARBONATE 8.4 % IV SOLN
INTRAVENOUS | Status: AC
  Filled 2019-09-07: qty 100

## 2019-09-07 MED ORDER — NOREPINEPHRINE 16 MG/250ML-% IV SOLN
0.0000 ug/min | INTRAVENOUS | Status: DC
  Administered 2019-09-07: 120 ug/min via INTRAVENOUS
  Filled 2019-09-07: qty 500
  Filled 2019-09-07 (×2): qty 250

## 2019-09-07 MED ORDER — EPINEPHRINE PF 1 MG/ML IJ SOLN
INTRAMUSCULAR | Status: AC
  Filled 2019-09-07: qty 4

## 2019-09-07 MED ORDER — EPINEPHRINE 1 MG/10ML IJ SOSY
PREFILLED_SYRINGE | INTRAMUSCULAR | Status: AC | PRN
  Administered 2019-09-07: 1 via INTRAVENOUS

## 2019-09-07 MED ORDER — SODIUM CHLORIDE 0.9 % IV SOLN
1.0000 g | Freq: Once | INTRAVENOUS | Status: AC
  Administered 2019-09-07: 1 g via INTRAVENOUS
  Filled 2019-09-07: qty 10

## 2019-09-07 MED ORDER — NOREPINEPHRINE 16 MG/250ML-% IV SOLN
INTRAVENOUS | Status: AC
  Filled 2019-09-07: qty 500

## 2019-09-07 MED ORDER — SODIUM BICARBONATE 8.4 % IV SOLN
50.0000 meq | Freq: Once | INTRAVENOUS | Status: AC
  Administered 2019-09-07: 50 meq via INTRAVENOUS

## 2019-09-07 MED ORDER — MORPHINE SULFATE (PF) 4 MG/ML IV SOLN
4.0000 mg | Freq: Once | INTRAVENOUS | Status: AC
  Administered 2019-09-07: 4 mg via INTRAVENOUS
  Filled 2019-09-07: qty 1

## 2019-09-07 MED ORDER — EPINEPHRINE (ANAPHYLAXIS) 30 MG/30ML IJ SOLN: 0.5000 ug/min | INTRAVENOUS | Status: DC

## 2019-09-07 MED ORDER — SODIUM BICARBONATE 8.4 % IV SOLN
Freq: Once | INTRAVENOUS | Status: AC
  Filled 2019-09-07: qty 850

## 2019-09-07 MED ORDER — EPINEPHRINE PF 1 MG/ML IJ SOLN
0.5000 ug/min | INTRAVENOUS | Status: DC
  Administered 2019-09-07: 0.5 ug/min via INTRAVENOUS
  Filled 2019-09-07: qty 4

## 2019-09-07 MED ORDER — DEXTROSE 10 % IV SOLN: Freq: Once | INTRAVENOUS | Status: DC

## 2019-09-07 MED ORDER — VASOPRESSIN 20 UNITS/100 ML INFUSION FOR SHOCK: 0.0000 [IU]/min | INTRAVENOUS | Status: DC

## 2019-09-07 MED ORDER — EPINEPHRINE HCL 5 MG/250ML IV SOLN IN NS
0.5000 ug/min | INTRAVENOUS | Status: DC
  Filled 2019-09-07: qty 250

## 2019-09-07 MED ORDER — CALCIUM CHLORIDE 10 % IV SOLN
INTRAVENOUS | Status: AC | PRN
  Administered 2019-09-07: 1 g via INTRAVENOUS

## 2019-09-09 MED FILL — Sodium Chloride IV Soln 0.9%: INTRAVENOUS | Qty: 250 | Status: AC

## 2019-09-09 MED FILL — Vasopressin IV Soln 20 Unit/ML (For IV Infusion): INTRAVENOUS | Qty: 10 | Status: CN

## 2019-09-10 ENCOUNTER — Inpatient Hospital Stay (HOSPITAL_COMMUNITY): Payer: BC Managed Care – PPO | Admitting: Hematology

## 2019-09-10 MED FILL — Vasopressin IV Soln 20 Unit/ML (For IV Infusion): INTRAVENOUS | Qty: 1 | Status: AC

## 2019-09-10 NOTE — Telephone Encounter (Signed)
Thanks Vickie for letting me know  Beverlee Nims, can we get a card ready to send to family (sister) and possibly girlfriend if we have that info.  I'll await death certificate.

## 2019-09-10 NOTE — Telephone Encounter (Signed)
Sympathy card sent 

## 2019-09-11 LAB — CULTURE, BLOOD (ROUTINE X 2)
Culture: NO GROWTH
Special Requests: ADEQUATE

## 2019-09-12 LAB — CULTURE, BLOOD (ROUTINE X 2): Special Requests: ADEQUATE

## 2019-09-12 MED FILL — Medication: Qty: 1 | Status: AC

## 2019-09-13 ENCOUNTER — Telehealth: Payer: Self-pay | Admitting: Medical

## 2019-09-13 NOTE — Telephone Encounter (Signed)
Duane Alexander  Please call family.  Please let family know we are sympathetic to their loss.  I always enjoyed talking to [place], he always seemed to have a smile and positive attitude when he came in, and I will miss talking with him.     I have been awaiting the death certificate to sign, but haven't seen anything.   Typically these come to [place] quickly.  I hate to bother them, but I just want to make sure I do my part timely to help them with the funeral plans and paperwork.    Thanks Hala for letting me know.  I saw your note on Monday.

## 2019-09-13 NOTE — Telephone Encounter (Signed)
Duane Alexander I don't feel comfortable calling the family about this. Can you call for me?

## 2019-09-17 NOTE — Telephone Encounter (Signed)
Sympathy card sent to patient.  Duane Alexander advised.

## 2019-09-18 ENCOUNTER — Other Ambulatory Visit (HOSPITAL_COMMUNITY): Payer: Self-pay

## 2019-09-18 ENCOUNTER — Ambulatory Visit (HOSPITAL_COMMUNITY): Payer: Self-pay | Admitting: Hematology

## 2019-09-23 ENCOUNTER — Other Ambulatory Visit (HOSPITAL_COMMUNITY): Payer: Self-pay

## 2019-09-26 ENCOUNTER — Inpatient Hospital Stay: Admit: 2019-09-26 | Payer: BC Managed Care – PPO | Admitting: General Surgery

## 2019-09-26 SURGERY — LAPAROSCOPY, DIAGNOSTIC
Anesthesia: General

## 2019-10-04 NOTE — ED Provider Notes (Signed)
Care assumed from Dr. Rogene Houston.  Patient here post arrest.  Has a history of metastatic pancreatic cancer.  He came in with respiratory distress and hypoxia with hypotension in the 90s.  Asystole on arrival here was intubated and started on epinephrine and Levophed.  Plan to be hyperkalemic with wide QRS on EKG.  Dr. Rogene Houston has discussed with family the poor prognosis.  Remains extremely hypotensive and acidotic.  Critical care feels he is not stable for transfer.  He was found to be hyperkalemic as well as hypoglycemic.   ED Course/Procedures     .Central Line  Date/Time: 09/08/2019 12:33 AM Performed by: Ezequiel Essex, MD Authorized by: Ezequiel Essex, MD   Consent:    Consent obtained:  Emergent situation   Consent given by:  Guardian   Risks discussed:  Bleeding, arterial puncture, incorrect placement, nerve damage, infection and pneumothorax Pre-procedure details:    Hand hygiene: Hand hygiene performed prior to insertion     Sterile barrier technique: All elements of maximal sterile technique followed     Skin preparation:  ChloraPrep   Skin preparation agent: Skin preparation agent completely dried prior to procedure   Anesthesia (see MAR for exact dosages):    Anesthesia method:  None Procedure details:    Location:  R femoral   Patient position:  Flat   Procedural supplies:  Triple lumen   Catheter size:  7 Fr   Landmarks identified: yes     Ultrasound guidance: yes     Sterile ultrasound techniques: Sterile gel and sterile probe covers were used     Number of attempts:  1   Successful placement: yes   Post-procedure details:    Post-procedure:  Dressing applied and line sutured   Assessment:  No pneumothorax on x-ray, placement verified by x-ray, free fluid flow and blood return through all ports   Patient tolerance of procedure:  Tolerated well, no immediate complications .Critical Care Performed by: Ezequiel Essex, MD Authorized by: Ezequiel Essex, MD    Critical care provider statement:    Critical care time (minutes):  100   Critical care was necessary to treat or prevent imminent or life-threatening deterioration of the following conditions:  Shock, respiratory failure, endocrine crisis, dehydration, metabolic crisis and circulatory failure (Cardiac arrest, metabolic acidosis, hyperkalemia)   Critical care was time spent personally by me on the following activities:  Discussions with consultants, evaluation of patient's response to treatment, examination of patient, ordering and performing treatments and interventions, ordering and review of laboratory studies, ordering and review of radiographic studies, pulse oximetry, re-evaluation of patient's condition, obtaining history from patient or surrogate and review of old charts    MDM    Potassium 7.5 with creatinine of 6 and bicarb 17.  pH 6.99.  Patient started on bicarb infusion. He is given multiple rounds of calcium, dextrose, insulin, bicarb  EKG shows widened QRS.  Discussed with Dr. Justin Mend of nephrology.  He agrees with transfer to [facility] for consideration of dialysis if patient survives.  Remains hypotensive on maximum Levophed. Adding vasopressin. Discussed with critical care Dr. Oletta Darter who feels patient has stabilized and accepts him to [facility], ICU for dialysis needs. No beds at this time.  Agrees with continued treatment of hyperkalemia. Will resend ABG and chemistry  PO2 only 58 on 100% FiO2.  Will increase PEEP to 10.  X-ray appears to have bilateral airspace disease but Covid is negative.  Patient with progressive acidosis and worsening hypotension despite multiple pressors.  He is on Levophed, vasopressin and epinephrine Lactate remains elevated.  Difficult to oxygenate. Persistent hypotension and lactic acidosis with hyperkalemia. Too unstable to get the CT.  He is mottled persistently acidotic.  Repeat ABG pending.  O2 saturation unable to register.  ABG  worsening with pH of 6.8 and PCO2 of only 32.  Potassium remains elevated.  Discussed with patient's sister by phone Dierdre Searles.  Discussed utility of resuscitation if he codes again.  Discussed multisystem organ failure and severe acidosis and hyperkalemia and multisystem organ failure.  She agrees that continue care as is but DO NOT RESUSCITATE if arrest happens again.  She is not able to come to the hospital due to Covid in herself.  Patient has no wife for family other than Mechele Claude his sister. She  asked for patient to be DNR in the setting of arrest but otherwise continue therapies. Confirmed on phone with Abby RN.  Patient made comfortable.  He proceeded to progressed to asystole and death was pronounced at 502.  His sister was updated. Cause of death is multisystem organ failure secondary to refractory metabolic acidosis, hyperkalemia.  D/w Mack Hook NP on call for patient's PCP Dorothea Ogle PA.  They accept death certificate to their office.         Ezequiel Essex, MD 09/30/19 [phone]

## 2019-10-04 NOTE — Progress Notes (Signed)
Medications given over [place] max limit per MD order during critical situation.  Informed by [facility] that patient is now DNR and that I am not to escalate care any further. 4 mg morphine given.

## 2019-10-04 NOTE — Telephone Encounter (Signed)
Dr. Wyvonnia Dusky at [facility] called to report that Mr. Falotico passed away. Family was notified (sister) per Dr. Wyvonnia Dusky. He presented in apparent respiratory distress to the ED. Death certificate will be sent to his PCP Dorothea Ogle at our office.

## 2019-10-04 DEATH — deceased

## 2021-04-27 IMAGING — CT CT ABD-PELV W/O CM
2 of 4 series · 12 of 46 positions shown, 14 images · non-contrast
Comparison: 09/19/2017

CLINICAL DATA: 63-year-old male with a history of abdominal pain

EXAM:
CT ABDOMEN AND PELVIS WITHOUT CONTRAST
TECHNIQUE: Multidetector CT imaging of the abdomen and pelvis was performed
following the standard protocol without IV contrast.

[Series 2: routine abdomen pelvis without 5.00 br40 s3 axial · axial · non-contrast · 0.53mm/px · z∈[+1178,+1543]mm · 9 of 89 slices shown, 11 images]
[im 8/89  soft-tissue]
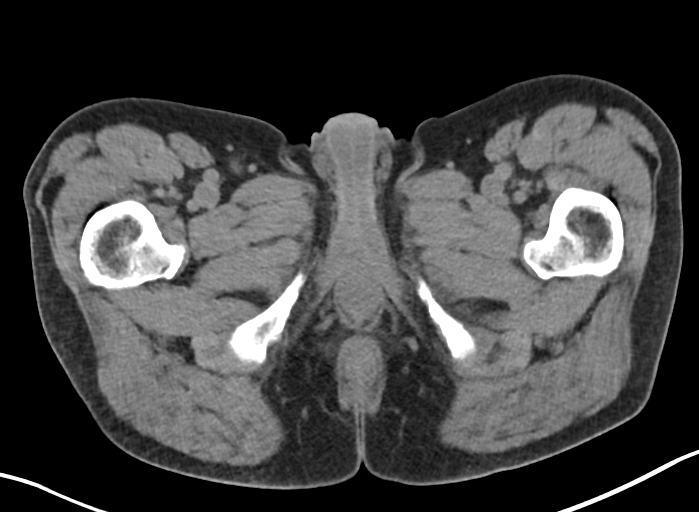
[im 8/89  bone]
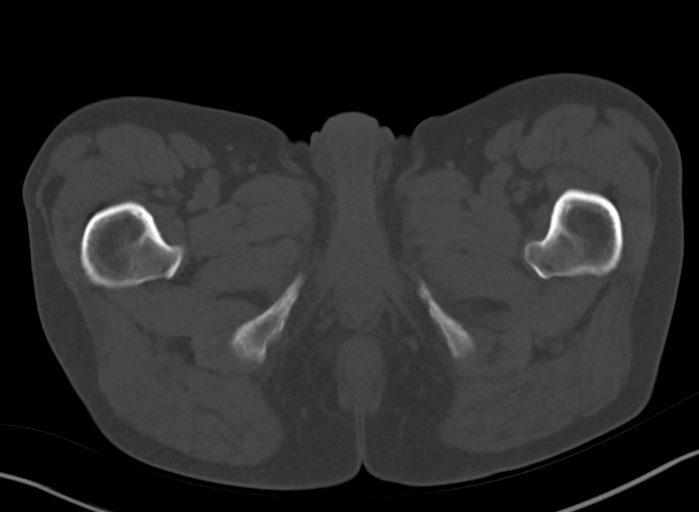
[im 18/89  soft-tissue]
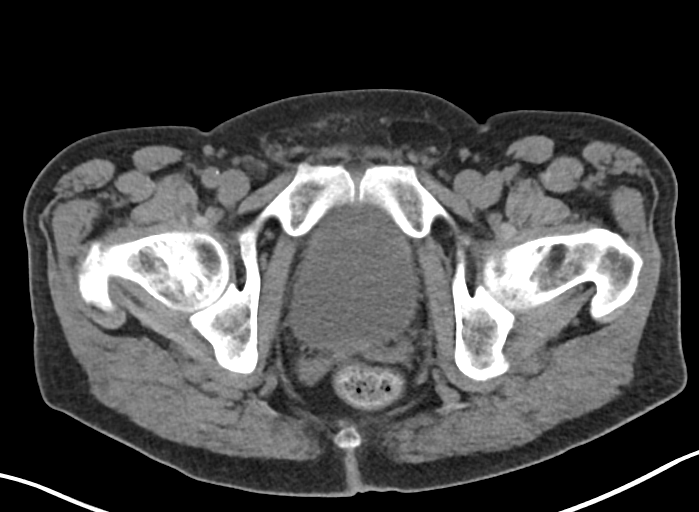
[im 25/89  soft-tissue]
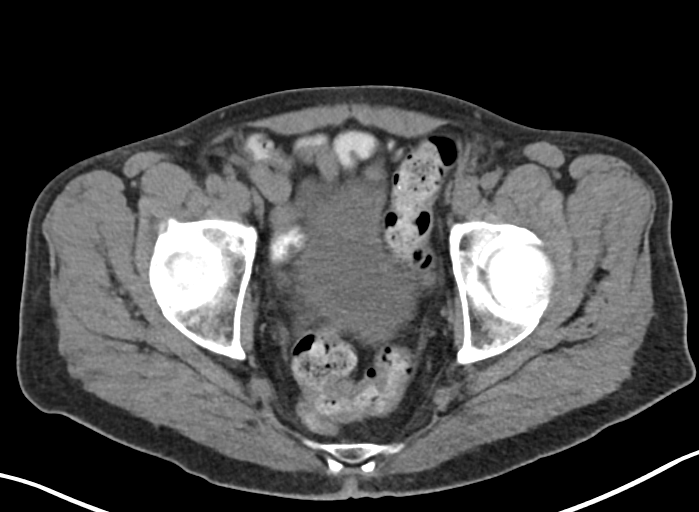
[im 36/89  soft-tissue]
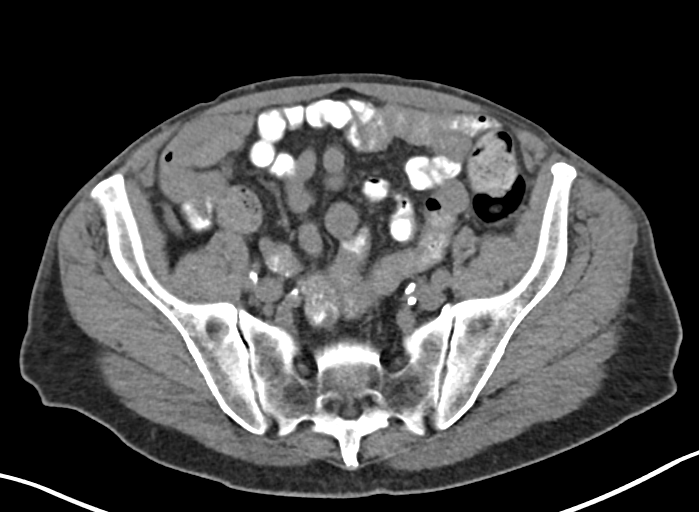
[im 46/89  soft-tissue]
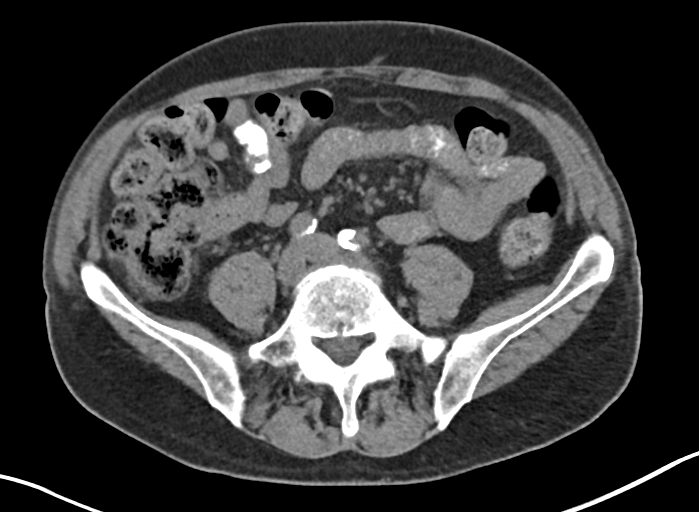
[im 53/89  soft-tissue]
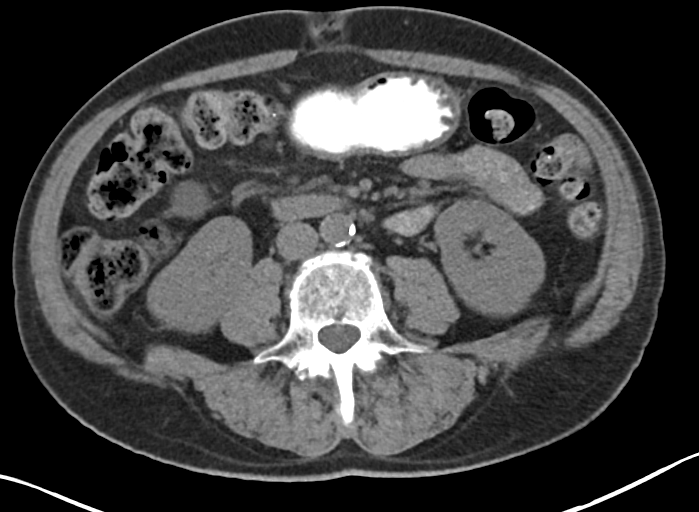
[im 64/89  soft-tissue]
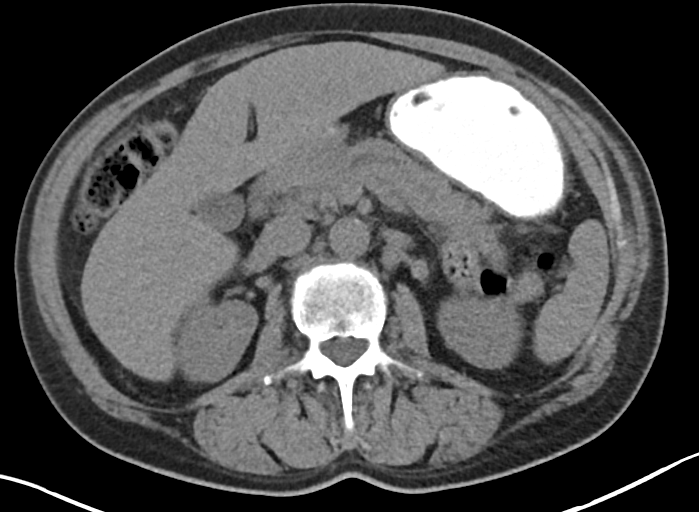
[im 74/89  soft-tissue]
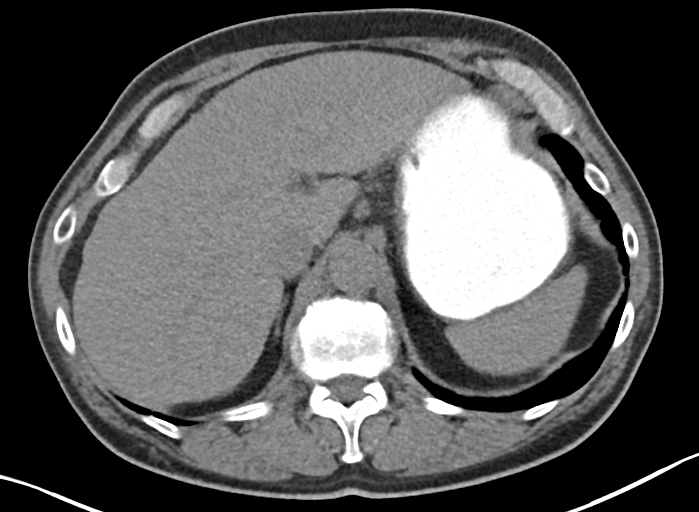
[im 81/89  soft-tissue]
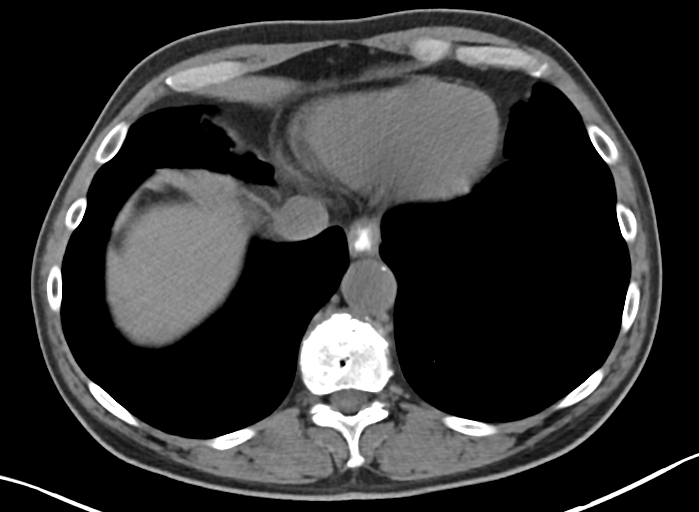
[im 81/89  bone]
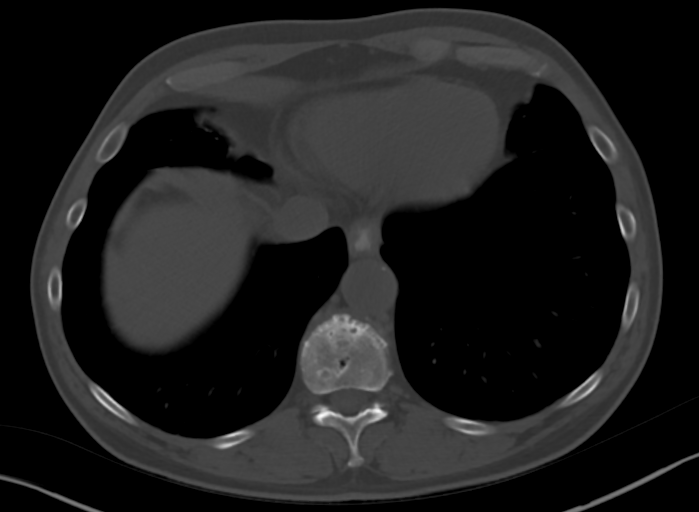

[Series 4: routine abdomen pelvis without 2.00 br40 s3 cor · coronal · non-contrast · 0.73mm/px · 3 of 136 slices shown]
[im 46/136  soft-tissue]
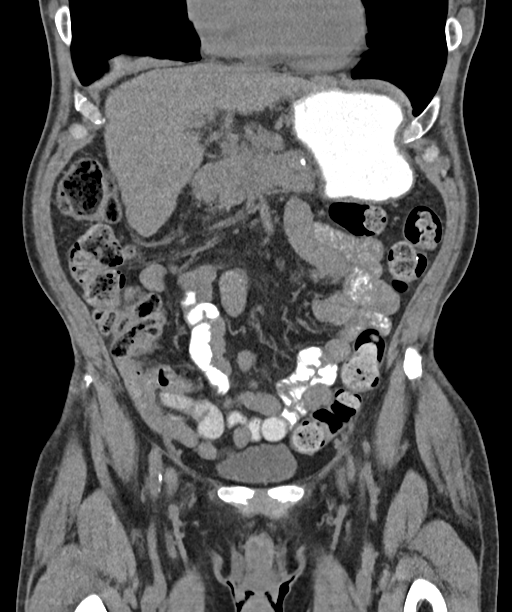
[im 61/136  soft-tissue]
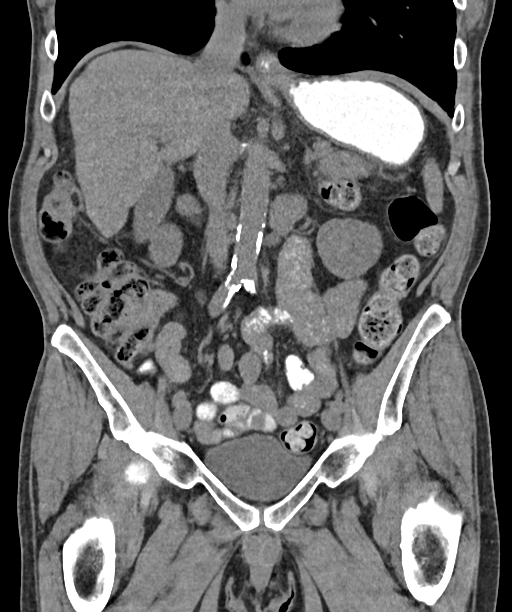
[im 76/136  soft-tissue]
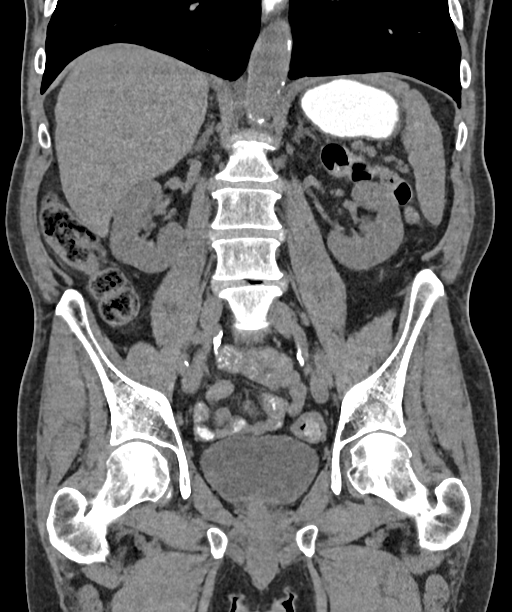

[12 of 46 positions shown; findings below may reference images not displayed]

FINDINGS: Lower chest: No acute finding of the lower chest.

Hepatobiliary: Mild nodularity of the liver surface with enlarged
left liver compatible with cirrhosis. Differential attenuation of
the material within the gallbladder with hyperdense material layered
dependently compatible with biliary sludge/microlithiasis. No
inflammatory changes.

Pancreas: There is new pancreatic ductal dilatation within the body
and the tail the pancreas. There appears to be a new hypodense
infiltrative region in the neck of the pancreas (image 27 of series
2). This is where the pancreatic duct dilation terminates.

Spleen: Unremarkable

Adrenals/Urinary Tract:

- Right adrenal gland:  Unremarkable

- Left adrenal gland: Unremarkable.

- Right kidney: No hydronephrosis, nephrolithiasis, inflammation, or
ureteral dilation. No focal lesion.

- Left Kidney: No hydronephrosis, nephrolithiasis, inflammation, or
ureteral dilation. No focal lesion.

- Urinary Bladder: Unremarkable.

Stomach/Bowel:

- Stomach: Unremarkable.

- Small bowel: Unremarkable

- Appendix: Normal.

- Colon: Unremarkable.

Vascular/Lymphatic: Atherosclerotic changes of the aorta. No
aneurysm. Calcifications bilateral iliac arteries.

Small inguinal lymph nodes. No pelvic adenopathy. No retroperitoneal
adenopathy with small lymph nodes present.

Small lymph nodes/nodules within the gastrohepatic ligament as well
as adjacent to the pancreatic body. Portal caval node measures 10
mm.

Reproductive: Diameter of the prostate measures 36 mm.

Other: None

Musculoskeletal: Multilevel degenerative changes of the
thoracolumbar spine. No acute displaced fracture
IMPRESSION: Current CT suggests pancreatic adenocarcinoma of the head of the
pancreas, resulting in distal pancreatic duct dilation. Given that
focal inflammatory changes/pancreatitis remains on the differential,
further contrast-enhanced imaging pancreas protocol with either MRI
or CT is indicated, as well as correlation with lab studies
including CA 19 9.

These results will be called to the ordering clinician or
representative by the Radiologist Assistant, and communication
documented in the PACS or zVision Dashboard.

Questionable lymphadenopathy of the upper abdomen which will be
better assessed on future contrast-enhanced studies.

Cirrhotic morphology of the liver.

Aortic Atherosclerosis ([id number]).

## 2021-07-03 IMAGING — DX DG CHEST 1V PORT
1 series · 2 of 2 positions shown · non-contrast
Comparison: 05/19/2016

CLINICAL DATA: Left Port-A-Cath placement

EXAM:
PORTABLE CHEST 1 VIEW

[Series 1: chest ap · 0.14mm/px · 2 of 2 slices shown]
[im 1/2]
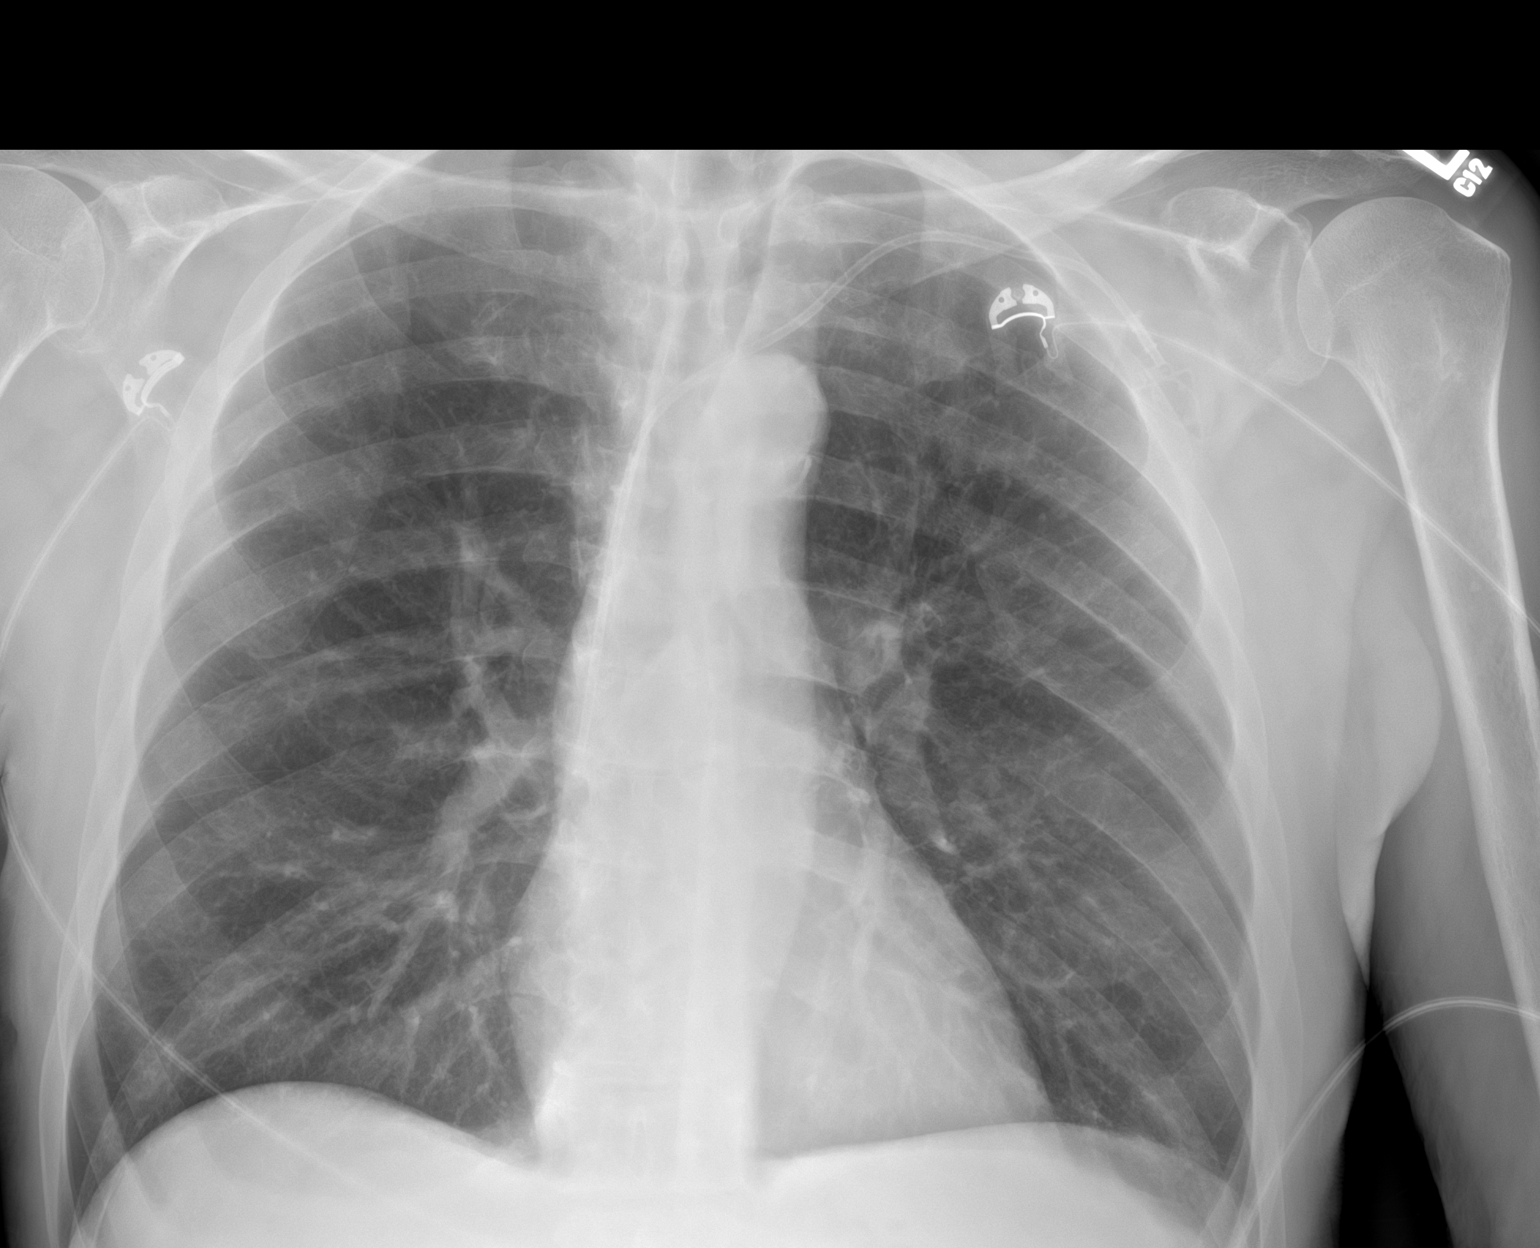
[im 2/2]
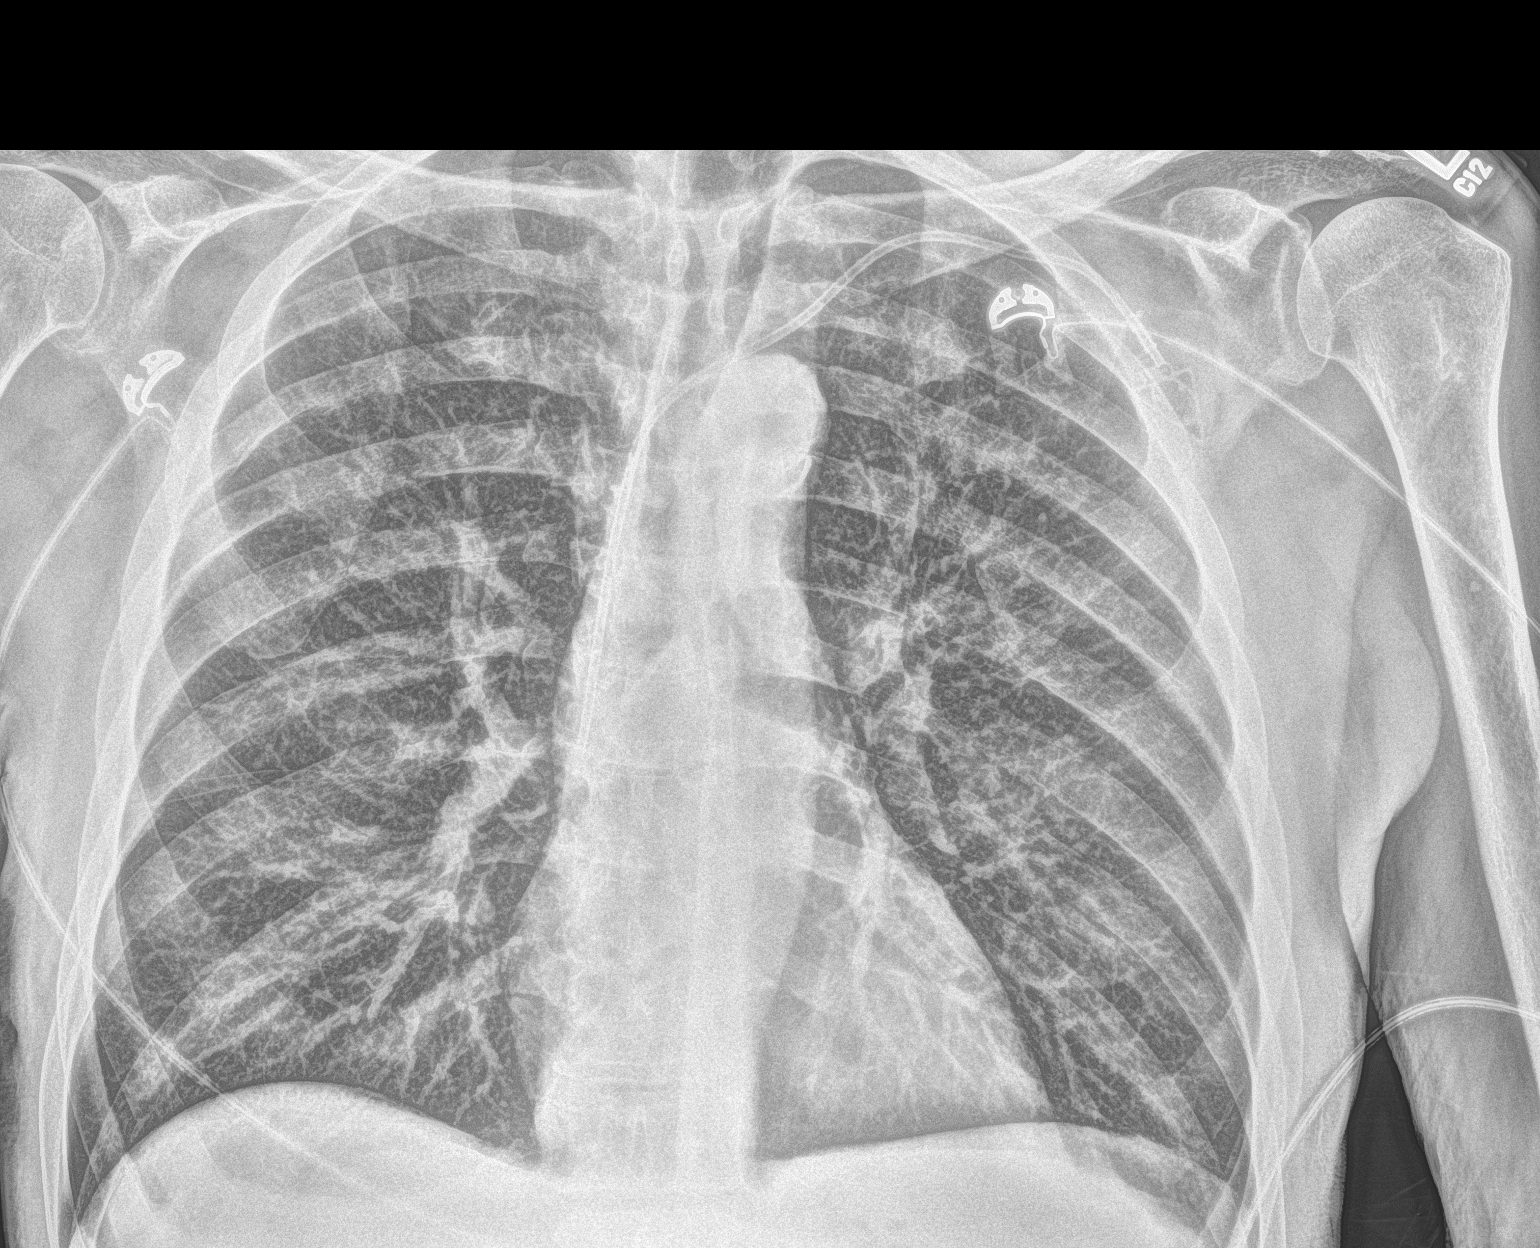

[2 of 2 positions shown; findings below may reference images not displayed]

FINDINGS: Left side subclavian Port-A-Cath has been placed with the tip in the
lower SVC. No pneumothorax. Heart is normal size. Lungs clear. No
acute bony abnormality. No effusions.
IMPRESSION: Left Port-A-Cath tip in the lower SVC. No pneumothorax. No active
disease.

## 2021-11-07 IMAGING — CT CT ABD-PEL WO/W CM
2 of 12 series · 11 of 46 positions shown, 16 images · IV contrast (Omnipaque or Isovue)
Comparison: CT scan 07/11/2019 and PET-CT 03/27/2019

CLINICAL DATA: Follow-up pancreatic cancer.

EXAM:
CT ABDOMEN AND PELVIS WITHOUT AND WITH CONTRAST
TECHNIQUE: Multidetector CT imaging of the abdomen and pelvis was performed
following the standard protocol before and following the bolus
administration of intravenous contrast.
CONTRAST:  100mL OMNIPAQUE IOHEXOL 300 MG/ML  SOLN

[Series 7: coronal arterial · coronal · arterial · 0.46mm/px · 3 of 101 slices shown]
[im 26/101  soft-tissue]
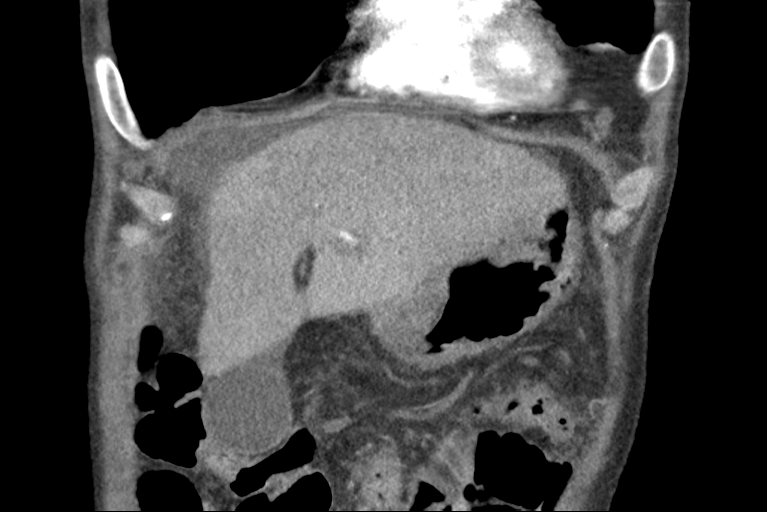
[im 51/101  soft-tissue]
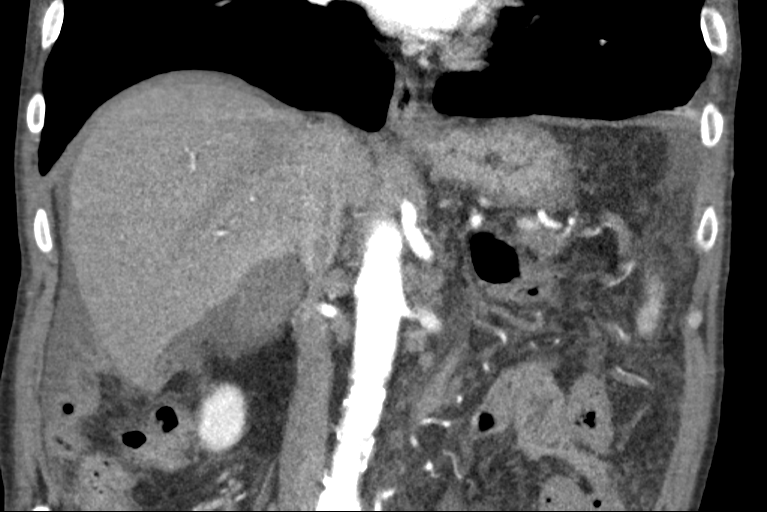
[im 76/101  soft-tissue]
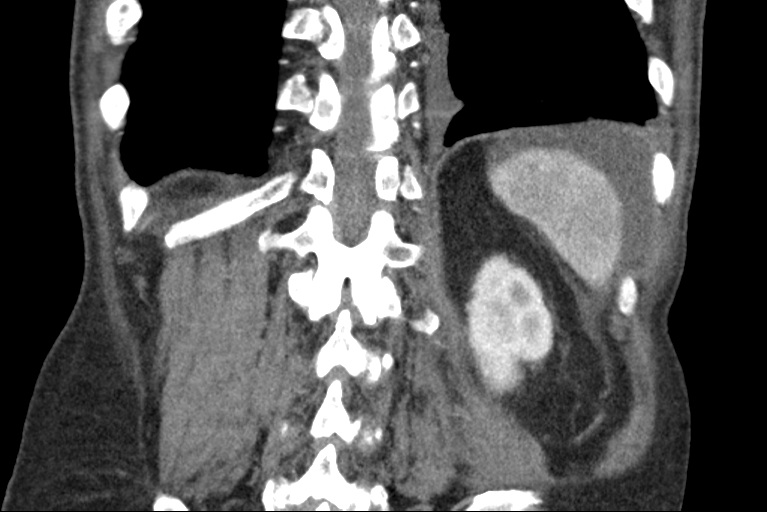

[Series 11: pancreas pv 2.0 · axial · portal-venous · 0.77mm/px · z∈[+882,+1216]mm · 8 of 215 slices shown, 13 images]
[im 24/215  soft-tissue]
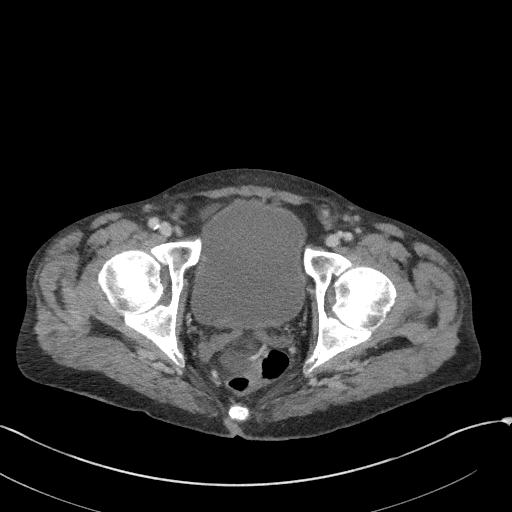
[im 24/215  bone]
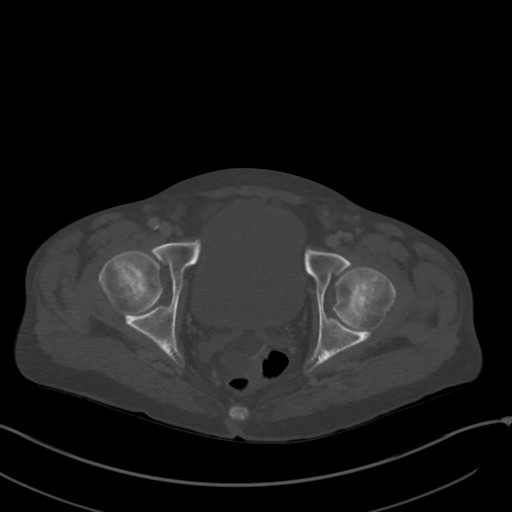
[im 48/215  soft-tissue]
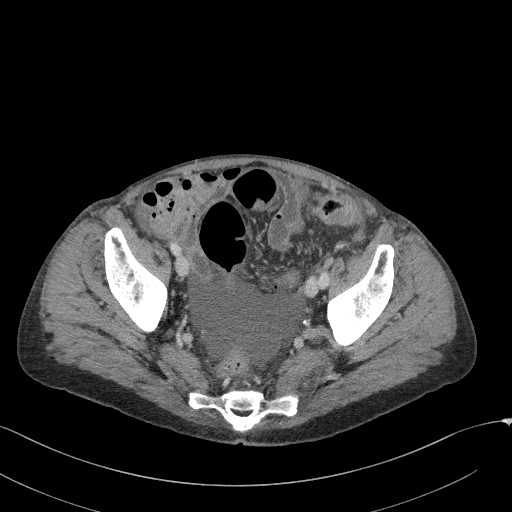
[im 72/215  soft-tissue]
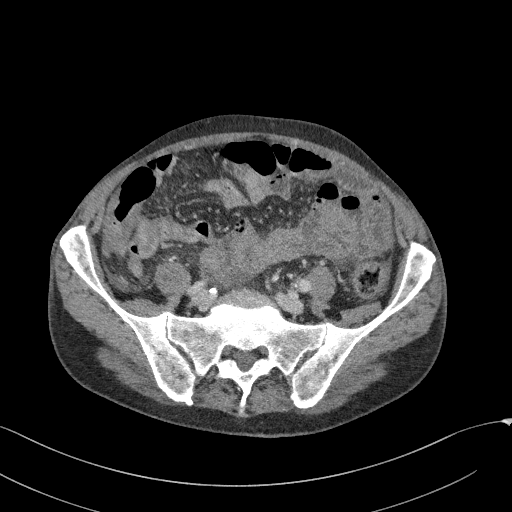
[im 96/215  soft-tissue]
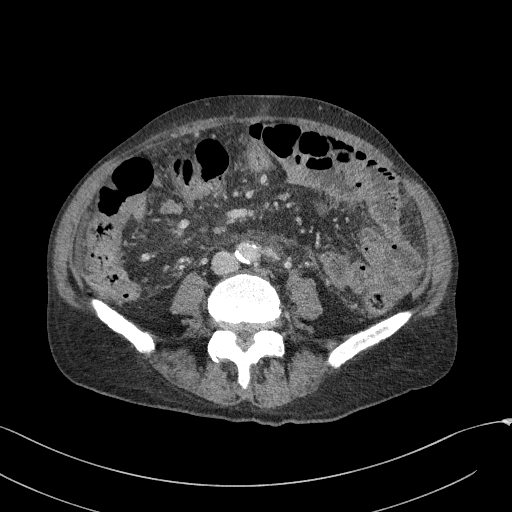
[im 119/215  soft-tissue]
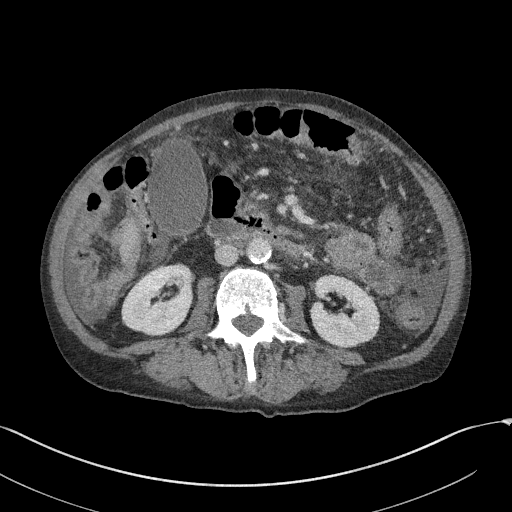
[im 119/215  lung]
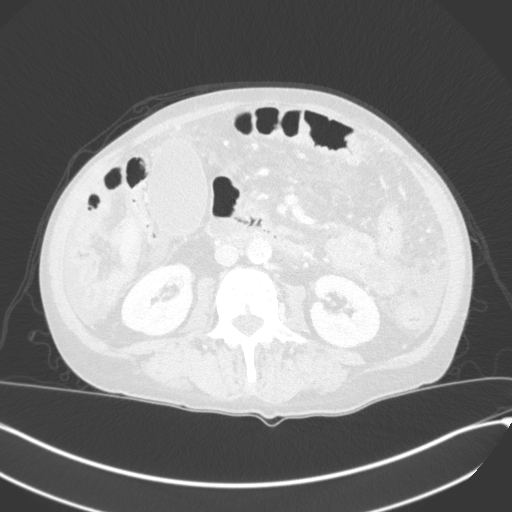
[im 143/215  soft-tissue]
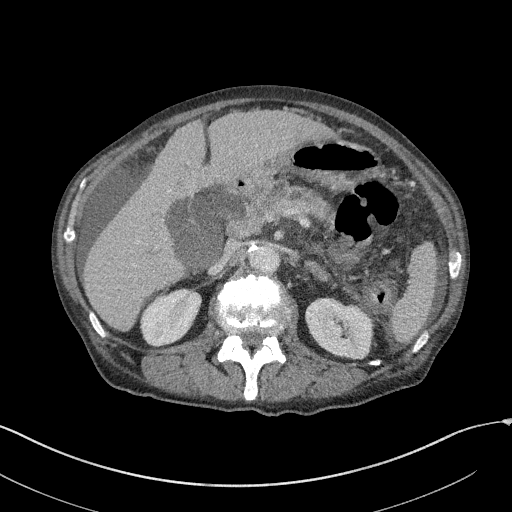
[im 143/215  lung]
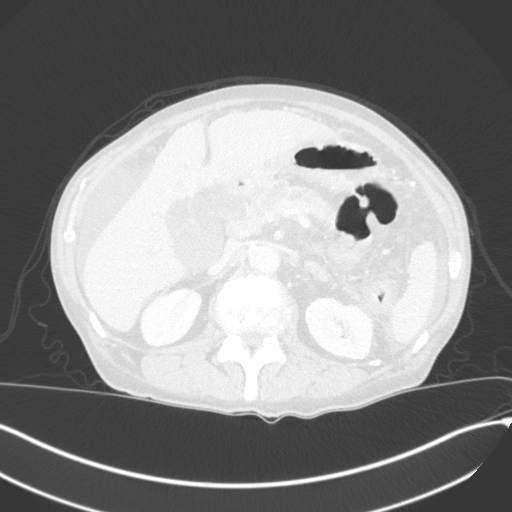
[im 167/215  soft-tissue]
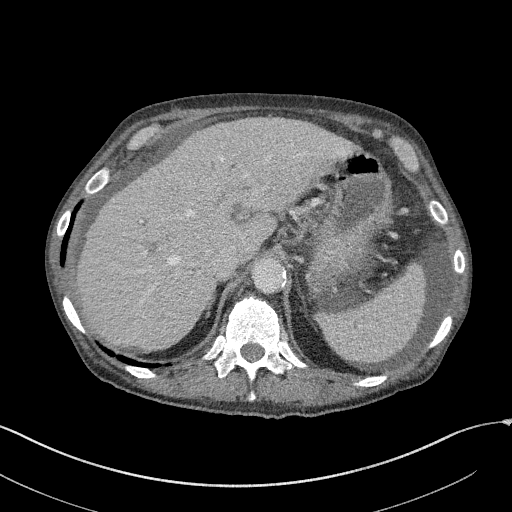
[im 167/215  lung]
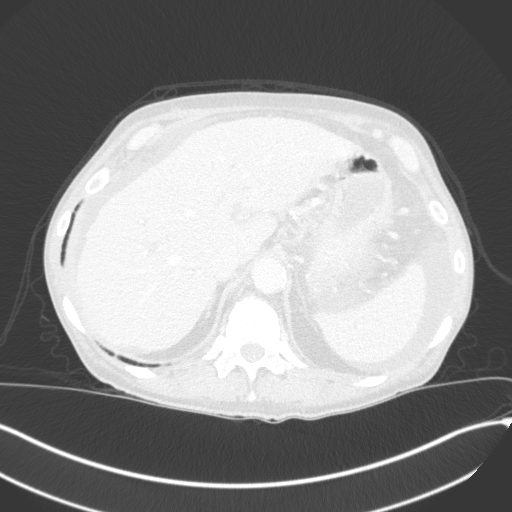
[im 191/215  soft-tissue]
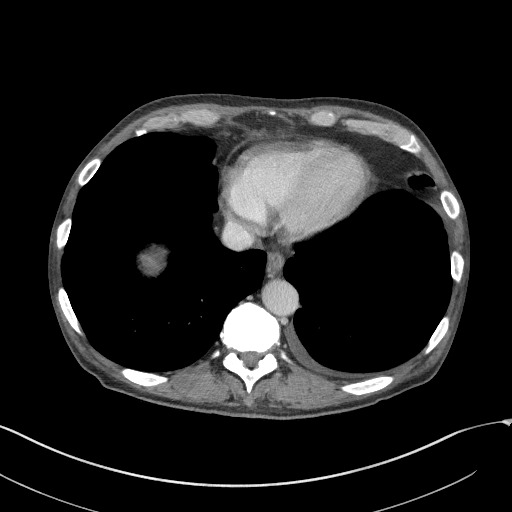
[im 191/215  lung]
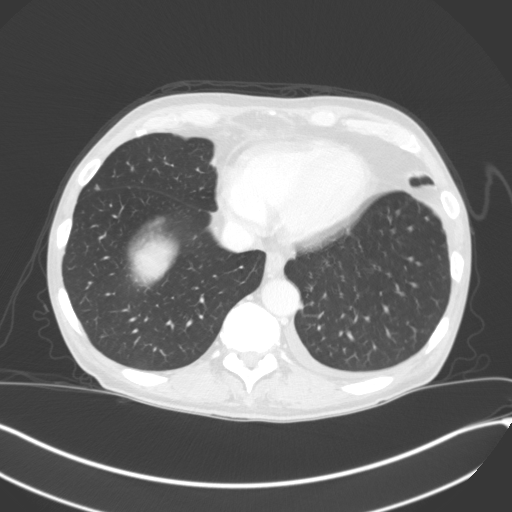

[11 of 46 positions shown; findings below may reference images not displayed]

FINDINGS: Lower chest: Small left pleural effusion is noted. The heart is
normal in size. No pericardial effusion.

There are new small pulmonary nodules worrisome for new metastatic
disease. The largest nodule measures 9 mm at the left lung base just
above the hemidiaphragm. On the coronal images this has a somewhat
triangular appearance and could be a focus of subpleural
atelectasis. Other sub 4 mm pulmonary nodules and both lower lobes
are indeterminate but somewhat worrisome. Recommend short-term
follow-up noncontrast chest CT in 3 months.

Hepatobiliary: Stable cirrhotic changes involving the liver. No
worrisome hepatic lesions to suggest metastatic disease. Progressive
intrahepatic biliary dilatation is noted along with progressive
common bile duct dilatation. The gallbladder is also now distended.
Findings suspicious for compression of the common bile duct in the
head of the pancreas. Patient may need common bile duct stent.
Recommend correlation with liver function studies.

Pancreas: Infiltrating low-attenuation lesion in the pancreatic head
is again demonstrated. This measures approximately 3.1 x 2.8 x
cm. It previously measured 3.6 x 2.2 x 2.1 cm. Slightly progressive
dilatation of the main pancreatic duct and progressive atrophy of
the pancreatic tail.

Spleen: Normal size.  No focal lesions.

Adrenals/Urinary Tract: The adrenal glands and kidneys are
unremarkable.

Stomach/Bowel: The stomach, duodenum, small bowel and colon are
grossly normal.

Vascular/Lymphatic: Stable fairly extensive peripancreatic,
periportal and retroperitoneal adenopathy. The aorta and branch
vessels are patent. The portal vein is patent. The confluence of the
SMV and splenic vein is irregular and somewhat compressed. No
complete obstruction or thrombosis.

Extensive portal venous collaterals noted.

Reproductive: The prostate gland and seminal vesicles are
unremarkable.

Other: Moderate amount of free abdominal/pelvic fluid, new since
prior study

Musculoskeletal: No significant bony findings.
IMPRESSION: 1. Slight interval decrease in size of the pancreatic head mass and
stable fairly extensive peripancreatic, periportal and
retroperitoneal adenopathy.
2. Progressive intrahepatic and common bile duct dilatation and
gallbladder distension with findings suspicious for compression of
the common bile duct in the head of the pancreas. Patient may need
common bile duct stent. Recommend correlation with liver function
studies.
3. Stable cirrhotic changes involving the liver but no findings for
hepatic metastatic disease.
4. New small left pleural effusion and new moderate amount of free
abdominal/pelvic fluid.
5. New small pulmonary nodules worrisome for new metastatic disease.
Recommend short-term follow-up noncontrast chest CT in 3 months.
6. Aortic atherosclerosis.

These results will be called to the ordering clinician or
representative by the Radiologist Assistant, and communication
documented in the PACS or [REDACTED].

Aortic Atherosclerosis ([id number]).

## 2021-11-08 IMAGING — RF DG ERCP WO/W SPHINCTEROTOMY
1 series · 3 of 3 positions shown · non-contrast
Comparison: CT 08/20/2019

CLINICAL DATA: 64-year-old male with a history of biliary occlusion

EXAM:
ERCP
TECHNIQUE: Multiple spot images obtained with the fluoroscopic device and
submitted for interpretation post-procedure.
FLUOROSCOPY TIME:  Fluoroscopy Time:  3 minutes 24 seconds

[Series 1: run · 3 of 3 slices shown]
[im 1/3]
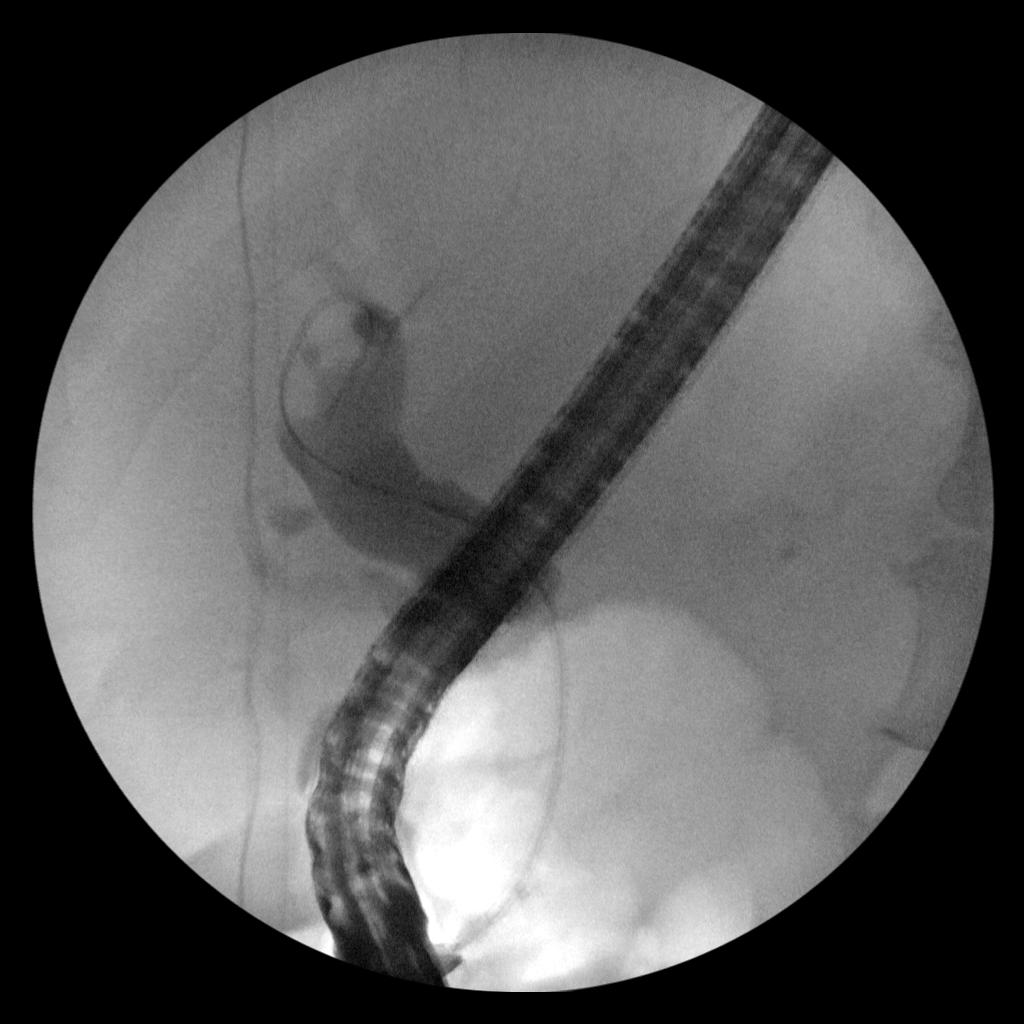
[im 2/3]
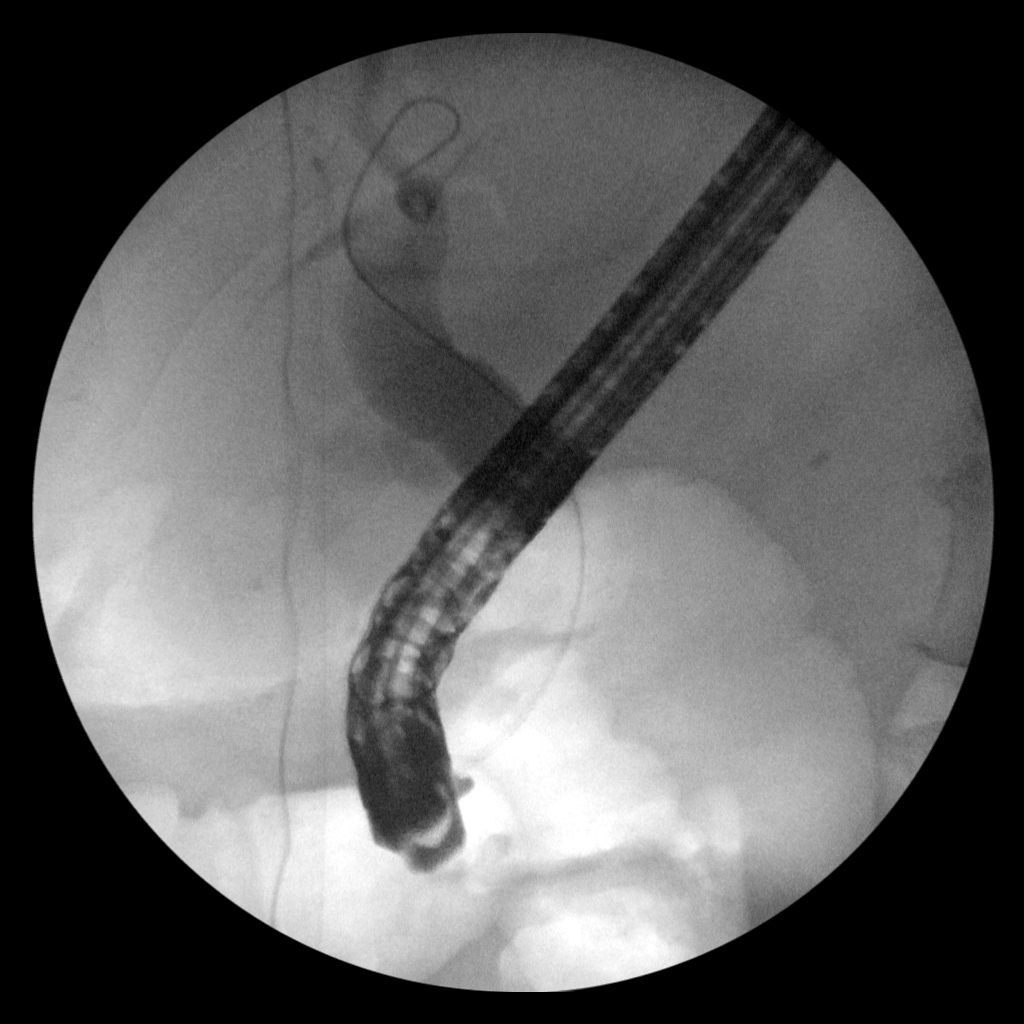
[im 3/3]
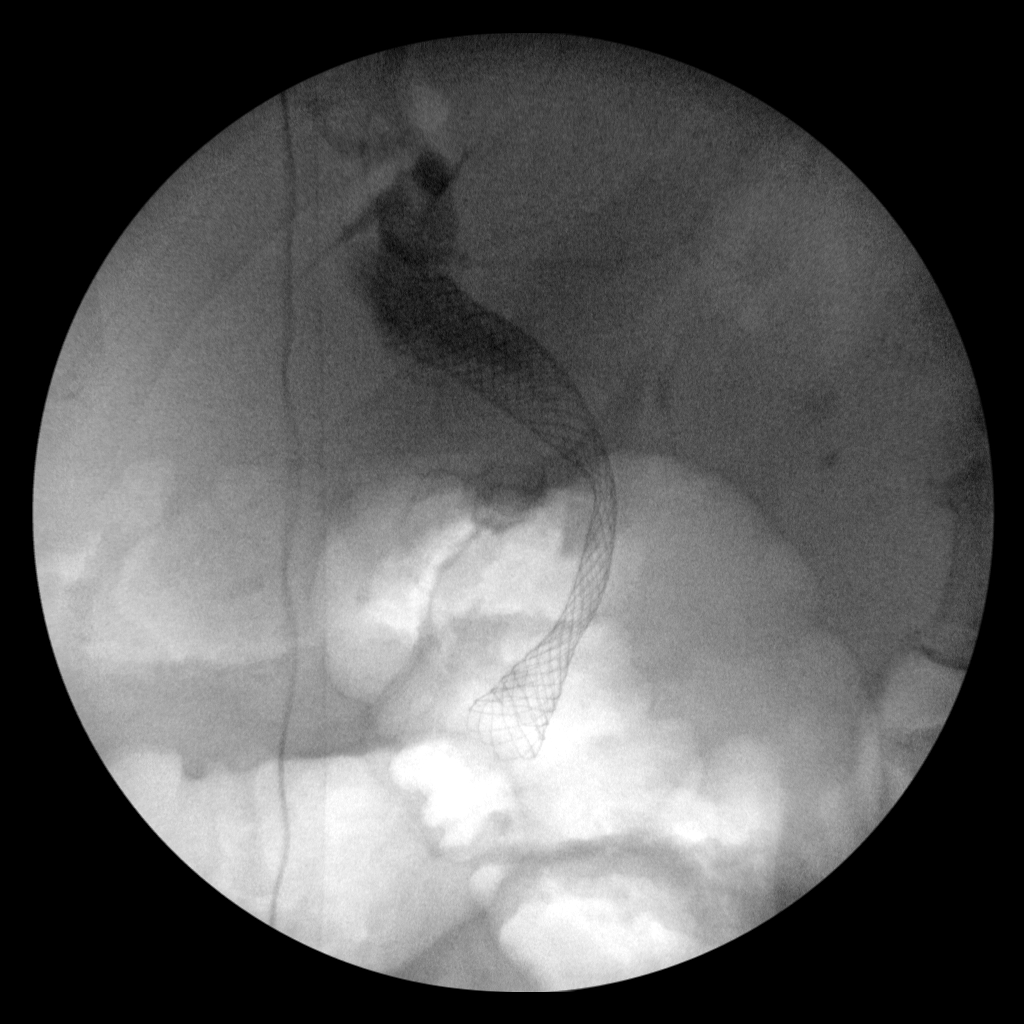

[3 of 3 positions shown; findings below may reference images not displayed]

FINDINGS: Limited images during ERCP.

Initial image demonstrates the endoscope projecting over the upper
abdomen with a safety wire in position and partial opacification of
the extrahepatic biliary ductal system.

Subsequently there is placement of a metallic biliary stent with
narrowing in the mid segment.
IMPRESSION: Limited images of ERCP demonstrates placement of a metallic biliary
stent. Please refer to the dictated operative report for full
details of intraoperative findings and procedure.
